# Patient Record
Sex: Male | Born: 1970 | Race: Black or African American | Hispanic: No | Marital: Married | Smoking: Current every day smoker
Health system: Southern US, Community
[De-identification: ages and names within clinical notes are randomized; demographics above are authoritative.]

## PROBLEM LIST (undated history)

## (undated) DIAGNOSIS — G4730 Sleep apnea, unspecified: Secondary | ICD-10-CM

## (undated) DIAGNOSIS — M1990 Unspecified osteoarthritis, unspecified site: Secondary | ICD-10-CM

## (undated) DIAGNOSIS — R519 Headache, unspecified: Secondary | ICD-10-CM

## (undated) DIAGNOSIS — K219 Gastro-esophageal reflux disease without esophagitis: Secondary | ICD-10-CM

## (undated) DIAGNOSIS — S83241A Other tear of medial meniscus, current injury, right knee, initial encounter: Secondary | ICD-10-CM

## (undated) DIAGNOSIS — L853 Xerosis cutis: Secondary | ICD-10-CM

## (undated) DIAGNOSIS — F528 Other sexual dysfunction not due to a substance or known physiological condition: Secondary | ICD-10-CM

## (undated) DIAGNOSIS — IMO0002 Reserved for concepts with insufficient information to code with codable children: Secondary | ICD-10-CM

## (undated) DIAGNOSIS — J189 Pneumonia, unspecified organism: Secondary | ICD-10-CM

## (undated) DIAGNOSIS — E119 Type 2 diabetes mellitus without complications: Secondary | ICD-10-CM

## (undated) DIAGNOSIS — Z87442 Personal history of urinary calculi: Secondary | ICD-10-CM

## (undated) DIAGNOSIS — N200 Calculus of kidney: Secondary | ICD-10-CM

## (undated) DIAGNOSIS — I10 Essential (primary) hypertension: Secondary | ICD-10-CM

## (undated) DIAGNOSIS — F419 Anxiety disorder, unspecified: Secondary | ICD-10-CM

## (undated) DIAGNOSIS — R51 Headache: Secondary | ICD-10-CM

## (undated) HISTORY — DX: Reserved for concepts with insufficient information to code with codable children: IMO0002

## (undated) HISTORY — DX: Other sexual dysfunction not due to a substance or known physiological condition: F52.8

## (undated) HISTORY — DX: Sleep apnea, unspecified: G47.30

## (undated) HISTORY — PX: OTHER SURGICAL HISTORY: SHX169

## (undated) HISTORY — DX: Morbid (severe) obesity due to excess calories: E66.01

## (undated) HISTORY — DX: Essential (primary) hypertension: I10

---

## 2005-02-26 ENCOUNTER — Emergency Department (HOSPITAL_COMMUNITY): Admission: EM | Admit: 2005-02-26 | Discharge: 2005-02-26 | Payer: Self-pay | Admitting: Emergency Medicine

## 2005-05-11 ENCOUNTER — Ambulatory Visit: Payer: Self-pay | Admitting: Internal Medicine

## 2005-05-28 ENCOUNTER — Ambulatory Visit: Payer: Self-pay | Admitting: Internal Medicine

## 2006-08-05 ENCOUNTER — Ambulatory Visit: Payer: Self-pay | Admitting: Internal Medicine

## 2006-09-22 ENCOUNTER — Emergency Department (HOSPITAL_COMMUNITY): Admission: EM | Admit: 2006-09-22 | Discharge: 2006-09-22 | Payer: Self-pay | Admitting: Emergency Medicine

## 2006-10-03 ENCOUNTER — Telehealth: Payer: Self-pay | Admitting: Internal Medicine

## 2006-10-31 ENCOUNTER — Telehealth: Payer: Self-pay | Admitting: Internal Medicine

## 2007-02-28 ENCOUNTER — Telehealth: Payer: Self-pay | Admitting: Internal Medicine

## 2007-03-03 ENCOUNTER — Ambulatory Visit: Payer: Self-pay | Admitting: Internal Medicine

## 2007-03-03 DIAGNOSIS — IMO0002 Reserved for concepts with insufficient information to code with codable children: Secondary | ICD-10-CM | POA: Insufficient documentation

## 2007-03-03 DIAGNOSIS — F528 Other sexual dysfunction not due to a substance or known physiological condition: Secondary | ICD-10-CM

## 2007-03-03 DIAGNOSIS — H60399 Other infective otitis externa, unspecified ear: Secondary | ICD-10-CM | POA: Insufficient documentation

## 2007-03-03 DIAGNOSIS — K649 Unspecified hemorrhoids: Secondary | ICD-10-CM

## 2007-03-03 HISTORY — DX: Reserved for concepts with insufficient information to code with codable children: IMO0002

## 2007-03-03 HISTORY — DX: Other sexual dysfunction not due to a substance or known physiological condition: F52.8

## 2007-03-03 LAB — CONVERTED CEMR LAB: Testosterone: 246.66 ng/dL — ABNORMAL LOW

## 2007-03-04 ENCOUNTER — Telehealth: Payer: Self-pay | Admitting: Internal Medicine

## 2007-07-03 ENCOUNTER — Ambulatory Visit: Payer: Self-pay | Admitting: Internal Medicine

## 2007-07-03 DIAGNOSIS — R51 Headache: Secondary | ICD-10-CM

## 2007-07-03 DIAGNOSIS — I10 Essential (primary) hypertension: Secondary | ICD-10-CM

## 2007-07-03 DIAGNOSIS — R519 Headache, unspecified: Secondary | ICD-10-CM | POA: Insufficient documentation

## 2007-07-03 DIAGNOSIS — K602 Anal fissure, unspecified: Secondary | ICD-10-CM | POA: Insufficient documentation

## 2007-07-03 HISTORY — DX: Essential (primary) hypertension: I10

## 2007-07-08 ENCOUNTER — Telehealth: Payer: Self-pay | Admitting: Internal Medicine

## 2007-08-05 ENCOUNTER — Telehealth: Payer: Self-pay | Admitting: Internal Medicine

## 2007-09-16 ENCOUNTER — Telehealth: Payer: Self-pay | Admitting: Internal Medicine

## 2007-10-23 ENCOUNTER — Telehealth: Payer: Self-pay | Admitting: Internal Medicine

## 2007-10-24 ENCOUNTER — Ambulatory Visit: Payer: Self-pay | Admitting: Internal Medicine

## 2007-10-24 LAB — CONVERTED CEMR LAB
ALT: 32 units/L (ref 0–53)
Albumin: 4.1 g/dL (ref 3.5–5.2)
BUN: 14 mg/dL (ref 6–23)
Blood Glucose, Fingerstick: 83
Chloride: 103 meq/L (ref 96–112)
Creatinine, Ser: 1.1 mg/dL (ref 0.4–1.5)
Glucose, Bld: 77 mg/dL (ref 70–99)
Lymphocytes Relative: 43 % (ref 12.0–46.0)
MCHC: 33.7 g/dL (ref 30.0–36.0)
MCV: 93.7 fL (ref 78.0–100.0)
Monocytes Absolute: 0.5 10*3/uL (ref 0.1–1.0)
Monocytes Relative: 9.7 % (ref 3.0–12.0)
Neutrophils Relative %: 42.7 % — ABNORMAL LOW (ref 43.0–77.0)
Potassium: 4.1 meq/L (ref 3.5–5.1)
Sodium: 140 meq/L (ref 135–145)
TSH: 2.03 microintl units/mL (ref 0.35–5.50)
Testosterone: 157.87 ng/dL — ABNORMAL LOW (ref 350.00–890)
Total Bilirubin: 0.7 mg/dL (ref 0.3–1.2)
Total Protein: 7.4 g/dL (ref 6.0–8.3)
WBC: 5.4 10*3/uL (ref 4.5–10.5)

## 2007-10-27 ENCOUNTER — Telehealth: Payer: Self-pay | Admitting: Internal Medicine

## 2007-11-11 ENCOUNTER — Encounter: Payer: Self-pay | Admitting: Internal Medicine

## 2007-12-03 ENCOUNTER — Telehealth: Payer: Self-pay | Admitting: Internal Medicine

## 2007-12-21 ENCOUNTER — Emergency Department (HOSPITAL_COMMUNITY): Admission: EM | Admit: 2007-12-21 | Discharge: 2007-12-21 | Payer: Self-pay | Admitting: Emergency Medicine

## 2007-12-30 ENCOUNTER — Ambulatory Visit: Payer: Self-pay | Admitting: Internal Medicine

## 2008-01-01 ENCOUNTER — Telehealth: Payer: Self-pay | Admitting: Internal Medicine

## 2008-01-01 LAB — CONVERTED CEMR LAB: Testosterone: 204.35 ng/dL — ABNORMAL LOW (ref 350.00–890)

## 2008-01-06 ENCOUNTER — Telehealth: Payer: Self-pay | Admitting: Internal Medicine

## 2008-01-07 ENCOUNTER — Telehealth: Payer: Self-pay | Admitting: Internal Medicine

## 2008-02-02 ENCOUNTER — Telehealth: Payer: Self-pay | Admitting: Internal Medicine

## 2008-08-13 ENCOUNTER — Ambulatory Visit: Payer: Self-pay | Admitting: Internal Medicine

## 2008-08-13 LAB — CONVERTED CEMR LAB
ALT: 34 units/L (ref 0–53)
Albumin: 3.9 g/dL (ref 3.5–5.2)
BUN: 17 mg/dL (ref 6–23)
Basophils Absolute: 0 10*3/uL (ref 0.0–0.1)
Basophils Relative: 0.4 % (ref 0.0–3.0)
Bilirubin Urine: NEGATIVE
Bilirubin, Direct: 0 mg/dL (ref 0.0–0.3)
Blood in Urine, dipstick: NEGATIVE
Cholesterol: 212 mg/dL — ABNORMAL HIGH (ref 0–200)
Direct LDL: 129.7 mg/dL
Eosinophils Relative: 6.3 % — ABNORMAL HIGH (ref 0.0–5.0)
Glucose, Bld: 94 mg/dL (ref 70–99)
Glucose, Urine, Semiquant: NEGATIVE
HCT: 49 % (ref 39.0–52.0)
Ketones, urine, test strip: NEGATIVE
Lymphocytes Relative: 34.9 % (ref 12.0–46.0)
MCHC: 34.5 g/dL (ref 30.0–36.0)
MCV: 91.9 fL (ref 78.0–100.0)
Monocytes Absolute: 0.5 10*3/uL (ref 0.1–1.0)
Neutrophils Relative %: 49.2 % (ref 43.0–77.0)
Nitrite: NEGATIVE
Potassium: 4 meq/L (ref 3.5–5.1)
Protein, U semiquant: NEGATIVE
RDW: 13.1 % (ref 11.5–14.6)
Sodium: 142 meq/L (ref 135–145)
Total Bilirubin: 1 mg/dL (ref 0.3–1.2)
Total Protein: 7.5 g/dL (ref 6.0–8.3)
WBC Urine, dipstick: NEGATIVE
WBC: 5.7 10*3/uL (ref 4.5–10.5)

## 2008-08-24 ENCOUNTER — Ambulatory Visit: Payer: Self-pay | Admitting: Internal Medicine

## 2009-08-23 ENCOUNTER — Ambulatory Visit: Payer: Self-pay | Admitting: Internal Medicine

## 2009-08-23 LAB — CONVERTED CEMR LAB
Albumin: 3.8 g/dL (ref 3.5–5.2)
Alkaline Phosphatase: 38 units/L — ABNORMAL LOW (ref 39–117)
BUN: 19 mg/dL (ref 6–23)
Basophils Absolute: 0 10*3/uL (ref 0.0–0.1)
Bilirubin, Direct: 0.1 mg/dL (ref 0.0–0.3)
CO2: 30 meq/L (ref 19–32)
Calcium: 9.2 mg/dL (ref 8.4–10.5)
Chloride: 104 meq/L (ref 96–112)
Creatinine, Ser: 1.2 mg/dL (ref 0.4–1.5)
Eosinophils Relative: 4.7 % (ref 0.0–5.0)
Glucose, Bld: 90 mg/dL (ref 70–99)
Lymphocytes Relative: 37.5 % (ref 12.0–46.0)
MCHC: 34.5 g/dL (ref 30.0–36.0)
MCV: 93 fL (ref 78.0–100.0)
Monocytes Relative: 8.8 % (ref 3.0–12.0)
Neutrophils Relative %: 48.6 % (ref 43.0–77.0)
Platelets: 200 10*3/uL (ref 150.0–400.0)
Potassium: 4.1 meq/L (ref 3.5–5.1)
RBC: 4.99 M/uL (ref 4.22–5.81)
RDW: 14.4 % (ref 11.5–14.6)
Specific Gravity, Urine: 1.025
Total Bilirubin: 0.6 mg/dL (ref 0.3–1.2)
Total CHOL/HDL Ratio: 3
Total Protein: 6.9 g/dL (ref 6.0–8.3)
Triglycerides: 89 mg/dL (ref 0.0–149.0)
WBC Urine, dipstick: NEGATIVE

## 2009-08-30 ENCOUNTER — Ambulatory Visit: Payer: Self-pay | Admitting: Internal Medicine

## 2010-03-14 NOTE — Assessment & Plan Note (Signed)
Summary: cpx/njr   Vital Signs:  Patient profile:   40 year old male Height:      72.75 inches Temp:     98.1 degrees F oral BP sitting:   132 / 80  (left arm) Cuff size:   large  Vitals Entered By: Duard Brady LPN (August 30, 2009 2:41 PM) CC: wt >350 , cpx - doing well , c/o increased sweating Is Patient Diabetic? No   CC:  wt >350 , cpx - doing well , and c/o increased sweating.  History of Present Illness: 40 year old patient has a history of hypertension, testosterone deficiency and EDD is seen today for an annual physical.  He has a history of exogenous obesity.  He has been off all his medications.  Preventive Screening-Counseling & Management  Alcohol-Tobacco     Smoking Cessation Counseling: yes  Allergies (verified): No Known Drug Allergies  Past History:  Past Medical History: Hypertension testosterone deficiency with ED Headache history tobacco use anal fissure morbid obesity  Past Surgical History: none  Family History: Reviewed history from 08/24/2008 and no changes required. father age 11 in good health mother is 95, hypertension, DJD  One brother, positive for arthritis, and hepatitis C, died of sepsis 4 sisters positive for diabetes; s/p PM  Social History: Reviewed history from 07/03/2007 and no changes required. Divorced two children [employer]  Review of Systems       The patient complains of weight gain.  The patient denies anorexia, fever, weight loss, vision loss, decreased hearing, hoarseness, chest pain, syncope, dyspnea on exertion, peripheral edema, prolonged cough, headaches, hemoptysis, abdominal pain, melena, hematochezia, severe indigestion/heartburn, hematuria, incontinence, genital sores, muscle weakness, suspicious skin lesions, transient blindness, difficulty walking, depression, unusual weight change, abnormal bleeding, enlarged lymph nodes, angioedema, breast masses, and testicular masses.    Physical  Exam  General:  overweight-appearing.  150/100overweight-appearing.   Head:  Normocephalic and atraumatic without obvious abnormalities. No apparent alopecia or balding. Eyes:  No corneal or conjunctival inflammation noted. EOMI. Perrla. Funduscopic exam benign, without hemorrhages, exudates or papilledema. Vision grossly normal. Ears:  External ear exam shows no significant lesions or deformities.  Otoscopic examination reveals clear canals, tympanic membranes are intact bilaterally without bulging, retraction, inflammation or discharge. Hearing is grossly normal bilaterally. Nose:  External nasal examination shows no deformity or inflammation. Nasal mucosa are pink and moist without lesions or exudates. Mouth:  Oral mucosa and oropharynx without lesions or exudates.  Teeth in good repair. Neck:  No deformities, masses, or tenderness noted. Chest Wall:  No deformities, masses, tenderness or gynecomastia noted. Breasts:  No masses or gynecomastia noted Lungs:  Normal respiratory effort, chest expands symmetrically. Lungs are clear to auscultation, no crackles or wheezes. Heart:  Normal rate and regular rhythm. S1 and S2 normal without gallop, murmur, click, rub or other extra sounds. Abdomen:  Bowel sounds positive,abdomen soft and non-tender without masses, organomegaly or hernias noted. Genitalia:  Testes bilaterally descended without nodularity, tenderness or masses. No scrotal masses or lesions. No penis lesions or urethral discharge. Msk:  No deformity or scoliosis noted of thoracic or lumbar spine.   Pulses:  R and L carotid,radial,femoral,dorsalis pedis and posterior tibial pulses are full and equal bilaterally Extremities:  No clubbing, cyanosis, edema, or deformity noted with normal full range of motion of all joints.   Neurologic:  No cranial nerve deficits noted. Station and gait are normal. Plantar reflexes are down-going bilaterally. DTRs are symmetrical throughout. Sensory, motor and  coordinative  functions appear intact. Skin:  Intact without suspicious lesions or rashes Cervical Nodes:  No lymphadenopathy noted Axillary Nodes:  No palpable lymphadenopathy Inguinal Nodes:  No significant adenopathy Psych:  Cognition and judgment appear intact. Alert and cooperative with normal attention span and concentration. No apparent delusions, illusions, hallucinations   Impression & Recommendations:  Problem # 1:  HYPERTENSION (ICD-401.9)  The following medications were removed from the medication list:    Benazepril Hcl 20 Mg Tabs (Benazepril hcl) ..... One daily His updated medication list for this problem includes:    Lisinopril-hydrochlorothiazide 20-25 Mg Tabs (Lisinopril-hydrochlorothiazide) ..... One tablet daily  The following medications were removed from the medication list:    Benazepril Hcl 20 Mg Tabs (Benazepril hcl) ..... One daily His updated medication list for this problem includes:    Lisinopril-hydrochlorothiazide 20-25 Mg Tabs (Lisinopril-hydrochlorothiazide) ..... One tablet daily  Problem # 2:  Preventive Health Care (ICD-V70.0)  Complete Medication List: 1)  Lisinopril-hydrochlorothiazide 20-25 Mg Tabs (Lisinopril-hydrochlorothiazide) .... One tablet daily 2)  Testim 1 % Gel (Testosterone) .... Use every morning 3)  Androgel 50 Mg/5gm Gel (Testosterone) .... Use every morning  Patient Instructions: 1)  Please schedule a follow-up appointment in 1 month. 2)  Limit your Sodium (Salt). 3)  Tobacco is very bad for your health and your loved ones! You Should stop smoking!. 4)  It is important that you exercise regularly at least 20 minutes 5 times a week. If you develop chest pain, have severe difficulty breathing, or feel very tired , stop exercising immediately and seek medical attention. 5)  You need to lose weight. Consider a lower calorie diet and regular exercise.  Prescriptions: ANDROGEL 50 MG/5GM GEL (TESTOSTERONE) use every morning  #0 x 6    Entered and Authorized by:   Gordy Savers  MD   Signed by:   Gordy Savers  MD on 08/30/2009   Method used:   Print then Give to Patient   RxID:   [id number] TESTIM 1 % GEL (TESTOSTERONE) use every morning  #90 x 6   Entered and Authorized by:   Gordy Savers  MD   Signed by:   Gordy Savers  MD on 08/30/2009   Method used:   Print then Give to Patient   RxID:   [id number] LISINOPRIL-HYDROCHLOROTHIAZIDE 20-25 MG TABS (LISINOPRIL-HYDROCHLOROTHIAZIDE) one tablet daily  #90 x 4   Entered and Authorized by:   Gordy Savers  MD   Signed by:   Gordy Savers  MD on 08/30/2009   Method used:   Print then Give to Patient   RxID:   [id number]

## 2010-03-14 NOTE — Progress Notes (Signed)
Summary: refill  Phone Note Call from Patient Call back at [phone]   Caller: pt vm triage Call For: k Summary of Call: 7.5 mg Hydrocodone [facility]  Initial call taken by: Roselle Locus,  October 23, 2007 1:44 PM  Follow-up for Phone Call        is this okay to call in? Follow-up by: Kern Reap CMA,  October 23, 2007 1:49 PM  Additional Follow-up for Phone Call Additional follow up Details #1::        #50      Appended Document: refill rx called in

## 2010-04-13 ENCOUNTER — Encounter: Payer: Self-pay | Admitting: Internal Medicine

## 2010-04-14 ENCOUNTER — Encounter: Payer: Self-pay | Admitting: Internal Medicine

## 2010-04-14 ENCOUNTER — Ambulatory Visit (INDEPENDENT_AMBULATORY_CARE_PROVIDER_SITE_OTHER): Payer: PRIVATE HEALTH INSURANCE | Admitting: Internal Medicine

## 2010-04-14 DIAGNOSIS — F528 Other sexual dysfunction not due to a substance or known physiological condition: Secondary | ICD-10-CM

## 2010-04-14 DIAGNOSIS — IMO0002 Reserved for concepts with insufficient information to code with codable children: Secondary | ICD-10-CM

## 2010-04-14 MED ORDER — TESTOSTERONE 50 MG/5GM (1%) TD GEL: 5.0000 g | Freq: Every day | TRANSDERMAL | Status: DC

## 2010-04-14 MED ORDER — LORAZEPAM 0.5 MG PO TABS: 0.5000 mg | ORAL_TABLET | Freq: Three times a day (TID) | ORAL | Status: AC | PRN

## 2010-04-14 MED ORDER — LISINOPRIL-HYDROCHLOROTHIAZIDE 20-25 MG PO TABS: 1.0000 | ORAL_TABLET | Freq: Every day | ORAL | Status: DC

## 2010-04-14 NOTE — Patient Instructions (Signed)
Limit your sodium (Salt) intake  Please check your blood pressure on a regular basis.  If it is consistently greater than 150/90, please make an office appointment.    It is important that you exercise regularly, at least 20 minutes 3 to 4 times per week.  If you develop chest pain or shortness of breath seek  medical attention.    Hypertension (High Blood Pressure) Most people with high blood pressure (hypertension) have no symptoms. High blood pressure can be a dangerous problem. Hypertension is dangerous because you may have it and not know it. High blood pressure may mean that your heart needs to work harder to pump blood.  Your blood pressure is measured with 2 numbers. The first number is when your heart flexes (contracts), and the second number is when your heart relaxes. The higher the numbers are, the more you are at risk for problems. Write down your blood pressure today. The best blood pressure for adults is 120/80 (mmHg) or lower. It is likely that your blood pressure was recorded at least 2 times today. It is important for you to give these numbers to your doctor. If you do not have a doctor, try to get follow-up care at a hospital or community clinic. You may need to start high blood pressure medicine. You may also need to adjust your medicines as told by your doctor. Even mild high blood pressure increases long-term health risks. One high reading does not mean you have hypertension. MARGIN-BOTTOM: 0mm  Your blood pressure should be taken when you are relaxed. It is also important to sit for about 10 minutes before being tested.  Things that can increase your blood pressure are:   Injury.   Illness.    Stress.     Caffeine.   Some medicines (like decongestants).       High blood pressure does not usually need emergency treatment.  HOME CARE MARGIN-BOTTOM: 0mm  Lifestyle and medicine changes may be needed, including:  MARGIN-BOTTOM: 0mm  Weight loss.  Exercise.    MARGIN-BOTTOM: 0mm  Limit the use of salt.  Stop smoking.  If using decongestants or birth control pills, talk to your doctor. These medicines might make blood pressure higher.  Do not use street drugs.  Females should not drink more than 1 alcoholic drink per day. Males should not drink more than 2 alcoholic drinks per day.  Take your blood pressure medicine. You will need to take it every day. If you do not get treated, there are risks, including:  MARGIN-BOTTOM: 0mm  Heart disease.  Stroke.  Kidney failure.  MARGIN-BOTTOM: 0mm  See your doctor as told.  GET HELP RIGHT AWAY IF: MARGIN-BOTTOM: 0mm  You get a very bad headache.  You get blurred or changing vision.  You feel confused.  You feel weak, numb, or faint.  You get chest or belly (abdominal) pain.  You throw up (vomit).  You cannot breathe very well.  If you have a blood pressure reading with a top number of 180 or higher, you need to see your doctor right away. This is especially true if you are having any of the problems listed above. MAKE SURE YOU: MARGIN-BOTTOM: 0mm  Understand these instructions.  Will watch your condition.  Will get help right away if you are not doing well or get worse.  Document Released: 07/18/2007 Document Re-Released: 07/19/2009 [facility] Patient Information 2011 [place], [place].Obesity Obesity is defined as having a Body Mass Index (BMI) of 30 or more.  To calculate your BMI divide your weight in pounds by your height in inches squared and multiply that product by 703. Major illnesses resulting from long-term obesity include:  Stroke.   Heart disease.   Diabetes.   Many cancers.   Arthritis.  Obesity also complicates recovery from many other medical problems.   CAUSES  A history of obesity in your parents.   Thyroid hormone imbalance.   Environmental factors such as excess calorie intake and physical inactivity.  TREATMENT A healthy weight loss program includes:  A calorie  restricted diet based on individual calorie needs.   Increased physical activity (exercise).  An exercise program is just as important as the right low calorie diet.   Weight-loss medicines should be used only under the supervision of your physician. These medicines help, but only if they are used with diet and exercise programs. Medicines can have side effects including nervousness, nausea, abdominal pain, diarrhea, headache, drowsiness, and depression.   An unhealthy weight loss program includes:  Fasting.   Fad diets.   Supplements and drugs.  These choices do not succeed in long-term weight control.   HOME CARE INSTRUCTIONS To help you make the needed dietary changes:    Keep a daily record of everything you eat. There are many free websites to help you with this. It may be helpful to measure your foods so you can determine if you are eating the correct portion sizes.   Use low-calorie cookbooks or take special cooking classes.   Avoid alcohol. Drink more water and drinks with no calories.   Take vitamins and supplements only as recommended by your caregiver.   Weight-loss support groups, Optometrist, counselors, and stress reduction education can also be very helpful.  PREVENTION Losing weight and keeping it off takes time, discipline, a healthy diet and regular exercise. Document Released: 03/08/2004 Document Re-Released: 02/18/2007 [employer] Patient Information 2011 [employer].1500 Calorie Diabetic Diet The 1500 calorie diabetic diet limits calories to 1500 each day. Following this diet and making healthy meal choices can help improve overall health. It controls blood sugar levels and can also help lower blood pressure and cholesterol.   SERVING SIZES: Measuring foods and serving sizes helps to make sure you are getting the right amount of food. The list below tells how big or small some common serving sizes are.    1 ounce (oz) . . . . . . . . . . . . . . . [name] 4  stacked dice   3 oz . . . . . . . . . . . . . . . . . . . . . [name] Deck of cards     1 teaspoon (tsp) . . . . . . . . . . . . . Tip of little finger     1 tablespoon (Tbsp) . . . . . . . . . . Thumb     2 Tbsp . . . . . . . . . . . . . . . . . . . . . Golf ball     1/2 cup . . . . . . . . . . . . . . . . . . . . Half of a fist     1 cup . . . . . . . . . . . . . . . . . . . . . A fist     GUIDELINES FOR CHOOSING FOODS: The goal of this diet  is to eat a variety of foods and limit calories to 1500 each day. This can be done by choosing foods that are low in calories and fat. The diet also suggests eating small amounts of food frequently. Doing this helps control your blood sugar levels, so they do not get too high or too low. Each meal or snack may include a protein food source to help you feel more satisfied. Try to eat about the same amount of food around the same time each day. This includes weekend days, travel days, and days off work. Space your meals about 4-5 hours apart, and add a snack between them, if you wish.   For example, a daily food plan could include breakfast, a morning snack, lunch, dinner, and an evening snack. Healthy meals and snacks have different types of foods, including whole grains, vegetables, fruits, lean meats, poultry, fish, and dairy products. As you plan your meals, select a variety of foods. Choose from the bread and starch, vegetable, fruit, dairy, and meat/protein groups. Examples of foods from each group are listed below, with their suggested serving sizes. Use measuring cups and spoons to become familiar with what a healthy portion looks like. Bread and Starch   Each serving equals 15 grams of carbohydrate 1 slice bread   1/4 bagel   3/4 cup cold cereal (unsweetened)   1/2 cup hot cereal or mashed potatoes   1 small potato (size of a computer mouse) 1/3 cup cooked pasta or rice 1/2 English muffin 1 cup broth based soup 3 cups of popcorn 4-6 whole wheat  crackers 1/2 cup cooked beans, peas, or corn Vegetables Each serving equals 5 grams of carbohydrate 1/2 cup cooked vegetables 1 cup raw vegetables 1/2 cup tomato or vegetable juice Fruit   Each serving equals 15 grams of carbohydrate   1 small apple or orange   1-1/4 cup watermelon or strawberries   1/2 cup applesauce (no sugar added)   2 Tbsp raisins   1/2 banana 1/2 cup canned fruit, packed in water or in its own juice 1/2 cup unsweetened fruit juice   Dairy Each serving equals 12-15 grams of carbohydrate   1 cup fat free milk 6 oz artificially sweetened yogurt or plain yogurt 1 cup low fat buttermilk 1 cup soy milk 1 cup almond milk Meat/Protein 1 large egg 2-3 oz meat, poultry, or fish 1/4 cup low fat cottage cheese 1 Tbsp peanut butter 1 oz low fat cheese 1/4 cup tuna, packed in water 1/2 cup tofu Fat 1 tsp oil 1 tsp trans fat free margarine 1 tsp butter 1 tsp mayonnaise 2 Tbsp avocado 1 Tbsp salad dressing 1 Tbsp cream cheese 2 Tbsp sour cream SAMPLE 1500 CALORIE DIET PLAN: Breakfast:  1/2 whole wheat English muffin (1 carb choice)   1 tsp trans fat free margarine   1 scrambled egg   1 cup fat free milk (1 carb choice)   1 small orange (1 carb choice)  Lunch:  Chicken wrap   1 whole wheat tortilla, 8-inch (1-1/2 carb choices)   2 oz chicken breast, sliced   2 Tbsp low fat salad dressing, such as [place]   1/4 cup shredded lettuce   2 slices tomato   1/2 cup carrot sticks   1 small apple (1 carb choice)  Afternoon Snack:  3 graham cracker squares (1 carb choice)   1 Tbsp peanut butter  Dinner:  2 oz lean pork chop, broiled   1 cup brown rice (3  carb choices)   1/2 cup steamed carrots   1/2 cup green beans   1 cup fat free milk (1 carb choice)   1 tsp trans fat free margarine  Evening Snack:  1/2 cup low fat cottage cheese   1 small peach or pear, sliced (or 1/2 cup canned in water) (1 carb choice)  Meal Plan You can use  this worksheet to help you make a daily meal plan based on the 1500 calorie diabetic diet suggestions. If you are using this plan to help you control your blood glucose, you may interchange carbohydrate containing foods (dairy, starches, and fruits). Select a variety of fresh foods of varying colors and flavors. The total amount of carbohydrate in your meals or snacks is more important than making sure you include all of the food groups every time you eat. You can choose from approximately this many of the following foods to build your day's meals:  6 Starches.   3 Vegetables.     2 Fruits.   2 Dairy.     4-6 oz Meat/Protein.   Up to 3 Fats.     Your dietician can use this worksheet to help you decide how many servings and which types of foods are right for you. Breakfast Food Group and Servings Food Choice Starch _________________________________________________________ Dairy __________________________________________________________ Fruit ___________________________________________________________ Meat/Protein_____________________________________________________ Fat ____________________________________________________________ Lunch Food Group and Servings Food Choice  Starch _________________________________________________________ Meat/Protein ____________________________________________________ Vegetables ______________________________________________________ Fruit ___________________________________________________________ Dairy __________________________________________________________ Fat ____________________________________________________________ Afternoon Snack Food Group and Servings Food Choice Dairy __________________________________________________________ Starch __________________________________________________________ Meat/Protein_____________________________________________________ Fruit ___________________________________________________________ Dinner Food Group  and Servings Food Choice Starch _________________________________________________________ Meat/Protein ____________________________________________________ Dairy __________________________________________________________ Vegetable ______________________________________________________ Fruit ___________________________________________________________ Fat ____________________________________________________________ Evening Snack Food Group and Servings Food Choice Fruit ___________________________________________________________ Meat/Protein ____________________________________________________ Dairy __________________________________________________________ Starch __________________________________________________________ Daily Totals Starches _________________________  Vegetables _________________________ Fruits _____________________________ Dairy _____________________________ Meat/Protein________________________ Fats _______________________________   Document Released: 08/21/2004 Document Re-Released: 07/19/2009 [employer] Patient Information 2011 [employer].2 Gram Low Sodium Diet A 2 gram sodium diet restricts the amount of sodium in the diet to no more than 2 grams (g) or 2000 milligrams (mg) daily. Limiting the amount of sodium is often used to help lower blood pressure. It is important if you have heart, liver, or kidney problems. Many foods contain sodium for flavor and sometimes as a preservative. When the amount of sodium in a diet needs to be low, it is important to know what to look for when choosing foods and drinks. The following includes some information and guidelines to help make it easier for you to adapt to a low sodium diet. QUICK TIPS  Do not add salt to food.   Avoid convenience items and fast food.   Choose unsalted snack foods.   Buy lower sodium products, often labeled as "lower sodium" or "no salt added."   Check food labels to learn how much sodium is in 1  serving.   When eating at a restaurant, ask that your food be prepared with less salt or none, if possible.  READING FOOD LABELS FOR SODIUM INFORMATION The nutrition facts label is a good place to find how much sodium is in foods. Look for products with no more than 500 to 600 mg of sodium per meal and no more than 150 mg per serving. Remember that 2 g = 2000 mg. The food label may also list foods as:  Sodium-free: Less than 5 mg in a serving.   Very low sodium: 35 mg or less in a serving.  Low-sodium: 140 mg or less in a serving.   Light in sodium: 50% less sodium in a serving. For example, if a food that usually has 300 mg of sodium is changed to become light in sodium, it will have 150 mg of sodium.   Reduced sodium: 25% less sodium in a serving. For example, if a food that usually has 400 mg of sodium is changed to reduced sodium, it will have 300 mg of sodium.  CHOOSING FOODS AVOID  CHOOSE   Grains:  Salted crackers and snack items. Some cereals, including instant hot cereals. Bread stuffing and biscuit mixes. Seasoned rice or pasta mixes.  Grains:  Unsalted snack items. Low-sodium cereals, oats, puffed wheat and rice, shredded wheat. English muffins and bread. Pasta.   Meats:  Salted, canned, smoked, spiced, or pickled meats, including fish and poultry. Bacon, ham, sausage, cold cuts, hot dogs, anchovies.  Meats:  Low-sodium canned tuna and salmon. Fresh or frozen meat, poultry, and fish.   Dairy:  Processed cheese and spreads. Cottage cheese. Buttermilk and condensed milk. Regular cheese.  Dairy:  Milk. Low-sodium cottage cheese. Yogurt. Sour cream. Low-sodium cheese.   Fruits and Vegetables:  Regular canned vegetables. Regular canned tomato sauce and paste. Frozen vegetables in sauces. Olives. Rosita Fire. Relishes. Sauerkraut.  Fruits and Vegetables:  Low-sodium canned vegetables. Low-sodium tomato sauce and paste. Fresh and frozen vegetables. Fresh and  frozen fruit.   Condiments:  Canned and packaged gravies. Worcestershire sauce. Tartar sauce. Barbecue sauce. Soy sauce. Steak sauce. Ketchup. Onion, garlic, and table salt. Meat flavorings and tenderizers.  Condiments:  Fresh and dried herbs and spices. Low-sodium varieties of mustard and ketchup. Lemon juice. Tabasco sauce. Horseradish.   SAMPLE 2 GRAM SODIUM MEAL PLAN Meal  Foods  Sodium (mg)   Breakfast  1 cup low-fat milk  2 slices whole-wheat toast 1 tablespoon heart-healthy margarine 1 hard-boiled egg 1 small orange  [phone]   Lunch  1 cup raw carrots   cup hummus 1 cup low-fat milk  cup red grapes 1 whole-wheat pita bread  [phone]   Dinner  1 cup whole-wheat pasta  1 cup low-sodium tomato sauce 3 ounces lean ground beef 1 small side salad (1 cup raw spinach leaves,  cup cucumber,  cup yellow bell pepper) with 1 teaspoon olive oil and 1 teaspoon red wine vinegar  [phone]   Snack  1 container low-fat vanilla yogurt  3 graham cracker squares  [phone]   Nutrient Analysis Calories: 2033 Protein: 77 g Carbohydrate: 282 g Fat: 72 g Sodium: 1971 mg Document Released: 01/29/2005 Document Re-Released: 07/19/2009 [facility] Patient Information 2011 [place], [place].

## 2010-04-14 NOTE — Progress Notes (Signed)
  Subjective:    Patient ID: Justin Norman, male    DOB: [birth year], 40 y.o.   MRN: [id number]  HPI   40 year old patient who has a history of hypertension. He has been noncompliant and out of his medication. He describes weight gain situational stress and fatigue. He is a OSA  Suspect  And has agreed to a have a sleep study performed later this spring. He has been under much situational stress 22 children job and also the illness of his fiance. He describes intermittent left facial numbness and swelling denies any other focal neurological symptoms he has testosterone deficiency but has not been taking AndroGel regularly. He feels there is some fluid retention in his legs.    Review of Systems  Constitutional: Positive for fatigue and unexpected weight change. Negative for fever, chills and appetite change.  HENT: Negative for hearing loss, ear pain, congestion, sore throat, trouble swallowing, neck stiffness, dental problem, voice change and tinnitus.   Eyes: Negative for pain, discharge and visual disturbance.  Respiratory: Negative for cough, chest tightness, wheezing and stridor.   Cardiovascular: Positive for leg swelling. Negative for chest pain and palpitations.  Gastrointestinal: Negative for nausea, vomiting, abdominal pain, diarrhea, constipation, blood in stool and abdominal distention.  Genitourinary: Negative for urgency, hematuria, flank pain, discharge, difficulty urinating and genital sores.  Musculoskeletal: Negative for myalgias, back pain, joint swelling, arthralgias and gait problem.  Skin: Negative for rash.  Neurological: Negative for dizziness, syncope, speech difficulty, weakness, numbness and headaches.  Hematological: Negative for adenopathy. Does not bruise/bleed easily.  Psychiatric/Behavioral: Negative for behavioral problems and dysphoric mood. The patient is not nervous/anxious.        Objective:   Physical Exam  Constitutional: He is oriented to person,  place, and time. He appears well-developed and well-nourished.        Weight 265  HENT:  Head: Normocephalic.  Right Ear: External ear normal.  Left Ear: External ear normal.  Eyes: Conjunctivae and EOM are normal.  Neck: Normal range of motion.  Cardiovascular: Normal rate and normal heart sounds.   Pulmonary/Chest: Breath sounds normal.  Abdominal: Bowel sounds are normal.  Musculoskeletal: Normal range of motion. He exhibits tenderness. He exhibits no edema.        Trace lower extremity edema  Neurological: He is alert and oriented to person, place, and time. No cranial nerve deficit.  Psychiatric: He has a normal mood and affect. His behavior is normal.          Assessment & Plan:   hypertension- poor control compliance issues stressed. A new prescription dispensed. Given information concerning restricted salt diet low-calorie diet. He is aware that he may have obstructive sleep apnea which might be aggravating blood pressure control. May also be a major factor with fatigue  Situational stress  Testosterone deficiency   All medications refilled  Lifestyle issues discussed at length

## 2010-04-25 ENCOUNTER — Encounter: Payer: Self-pay | Admitting: Internal Medicine

## 2010-04-25 ENCOUNTER — Ambulatory Visit (INDEPENDENT_AMBULATORY_CARE_PROVIDER_SITE_OTHER): Payer: PRIVATE HEALTH INSURANCE | Admitting: Internal Medicine

## 2010-04-25 VITALS — BP 140/80 | Temp 98.2°F | Wt 365.0 lb

## 2010-04-25 DIAGNOSIS — M549 Dorsalgia, unspecified: Secondary | ICD-10-CM

## 2010-04-25 DIAGNOSIS — I10 Essential (primary) hypertension: Secondary | ICD-10-CM

## 2010-04-25 MED ORDER — METHYLPREDNISOLONE ACETATE 80 MG/ML IJ SUSP
80.0000 mg | Freq: Once | INTRAMUSCULAR | Status: AC
  Administered 2010-04-25: 80 mg via INTRAMUSCULAR

## 2010-04-25 MED ORDER — CYCLOBENZAPRINE HCL 10 MG PO TABS: 10.0000 mg | ORAL_TABLET | Freq: Three times a day (TID) | ORAL | Status: AC | PRN

## 2010-04-25 MED ORDER — TRAMADOL HCL 50 MG PO TABS: 50.0000 mg | ORAL_TABLET | Freq: Four times a day (QID) | ORAL | Status: AC | PRN

## 2010-04-25 NOTE — Progress Notes (Signed)
  Subjective:    Patient ID: Justin Norman, male    DOB: [birth year], 40 y.o.   MRN: [id number]  HPI   40 year old patient has a history of exogenous obesity and treated hypertension. For the past 6 months he has had intermittent low back pain. 3 days ago he lifted a tire into a truck and had the acute lumbar discomfort. Pain is aggravated by walking and bending and stooping but alleviated by rest and he has no pain with sitting. At times he feels there might be some radiation of pain to the left lateral leg but no striking radicular symptoms. No motor weakness. He has been using Aleve and ibuprofen with some benefit.    Review of Systems  Constitutional: Negative for fever, chills, appetite change and fatigue.  HENT: Negative for hearing loss, ear pain, congestion, sore throat, trouble swallowing, neck stiffness, dental problem, voice change and tinnitus.   Eyes: Negative for pain, discharge and visual disturbance.  Respiratory: Negative for cough, chest tightness, wheezing and stridor.   Cardiovascular: Negative for chest pain, palpitations and leg swelling.  Gastrointestinal: Negative for nausea, vomiting, abdominal pain, diarrhea, constipation, blood in stool and abdominal distention.  Genitourinary: Negative for urgency, hematuria, flank pain, discharge, difficulty urinating and genital sores.  Musculoskeletal: Positive for back pain. Negative for myalgias, joint swelling, arthralgias and gait problem.  Skin: Negative for rash.  Neurological: Negative for dizziness, syncope, speech difficulty, weakness, numbness and headaches.  Hematological: Negative for adenopathy. Does not bruise/bleed easily.  Psychiatric/Behavioral: Negative for behavioral problems and dysphoric mood. The patient is not nervous/anxious.        Objective:   Physical Exam  Constitutional: He appears well-developed and well-nourished. No distress.        obese  Musculoskeletal: Normal range of motion. He exhibits  no edema and no tenderness.        Straight leg testing was negative. No lumbar tenderness or spasm noted  Reflexes were intact  Motor strength was normal          Assessment & Plan:   #1. Back pain suspect musculoligamentous  . We'll treat with rest anti-inflammatories. The patient was given Depo-Medrol 80 mg IM he was given prescriptions for tramadol and Flexeril. He will continue Aleve 2 tablets twice a day. If he fails to improve or worsens he has been asked to contact the office

## 2010-04-25 NOTE — Patient Instructions (Signed)
Limit your sodium (Salt) intake You need to lose weight.  Consider a lower calorie diet and regular exercise.  Most patients with low back pain will improve with time over the next two to 6 weeks.  Keep active but avoid any activities that cause pain.  Apply moist heat to the low back area several times daily.  Call or return to clinic prn if these symptoms worsen or fail to improve as anticipated.

## 2010-05-01 ENCOUNTER — Telehealth: Payer: Self-pay | Admitting: *Deleted

## 2010-05-01 MED ORDER — HYDROCODONE-ACETAMINOPHEN 5-500 MG PO TABS: 1.0000 | ORAL_TABLET | Freq: Four times a day (QID) | ORAL | Status: AC | PRN

## 2010-05-01 NOTE — Telephone Encounter (Signed)
Continue alleve 2 BID  Please call in a prescription for generic Vicodin 5-500   #50 one or 2 every 6 hours as needed for pain

## 2010-05-01 NOTE — Telephone Encounter (Signed)
Spoke with pt - informed of rx called [facility]

## 2010-05-01 NOTE — Telephone Encounter (Signed)
Tramadol and muscle relaxers are not helping and and wants stronger pills.  " Back Pain"

## 2010-05-11 ENCOUNTER — Emergency Department (HOSPITAL_COMMUNITY): Payer: PRIVATE HEALTH INSURANCE

## 2010-05-11 ENCOUNTER — Emergency Department (HOSPITAL_COMMUNITY)
Admission: EM | Admit: 2010-05-11 | Discharge: 2010-05-12 | Disposition: A | Discharge status: Home / Self Care | Payer: PRIVATE HEALTH INSURANCE | Attending: Emergency Medicine | Admitting: Emergency Medicine

## 2010-05-11 DIAGNOSIS — R5381 Other malaise: Secondary | ICD-10-CM | POA: Insufficient documentation

## 2010-05-11 DIAGNOSIS — R05 Cough: Secondary | ICD-10-CM | POA: Insufficient documentation

## 2010-05-11 DIAGNOSIS — IMO0001 Reserved for inherently not codable concepts without codable children: Secondary | ICD-10-CM | POA: Insufficient documentation

## 2010-05-11 DIAGNOSIS — K5900 Constipation, unspecified: Secondary | ICD-10-CM | POA: Insufficient documentation

## 2010-05-11 DIAGNOSIS — M79609 Pain in unspecified limb: Secondary | ICD-10-CM | POA: Insufficient documentation

## 2010-05-11 DIAGNOSIS — R109 Unspecified abdominal pain: Secondary | ICD-10-CM | POA: Insufficient documentation

## 2010-05-11 DIAGNOSIS — R000 Tachycardia, unspecified: Secondary | ICD-10-CM | POA: Insufficient documentation

## 2010-05-11 DIAGNOSIS — I10 Essential (primary) hypertension: Secondary | ICD-10-CM | POA: Insufficient documentation

## 2010-05-11 DIAGNOSIS — R509 Fever, unspecified: Secondary | ICD-10-CM | POA: Insufficient documentation

## 2010-05-11 DIAGNOSIS — R5383 Other fatigue: Secondary | ICD-10-CM | POA: Insufficient documentation

## 2010-05-11 DIAGNOSIS — R51 Headache: Secondary | ICD-10-CM | POA: Insufficient documentation

## 2010-05-11 DIAGNOSIS — J111 Influenza due to unidentified influenza virus with other respiratory manifestations: Secondary | ICD-10-CM | POA: Insufficient documentation

## 2010-05-11 DIAGNOSIS — R61 Generalized hyperhidrosis: Secondary | ICD-10-CM | POA: Insufficient documentation

## 2010-05-11 DIAGNOSIS — R059 Cough, unspecified: Secondary | ICD-10-CM | POA: Insufficient documentation

## 2010-05-11 DIAGNOSIS — Z79899 Other long term (current) drug therapy: Secondary | ICD-10-CM | POA: Insufficient documentation

## 2010-05-11 LAB — CBC
HCT: 43.9 % (ref 39.0–52.0)
MCH: 30.9 pg (ref 26.0–34.0)
MCV: 88.7 fL (ref 78.0–100.0)
Platelets: 186 10*3/uL (ref 150–400)
RDW: 13.4 % (ref 11.5–15.5)
WBC: 12.7 10*3/uL — ABNORMAL HIGH (ref 4.0–10.5)

## 2010-05-11 LAB — DIFFERENTIAL
Basophils Absolute: 0 10*3/uL (ref 0.0–0.1)
Lymphocytes Relative: 18 % (ref 12–46)
Lymphs Abs: 2.3 10*3/uL (ref 0.7–4.0)
Monocytes Absolute: 1.5 10*3/uL — ABNORMAL HIGH (ref 0.1–1.0)
Monocytes Relative: 12 % (ref 3–12)
Neutro Abs: 8.8 10*3/uL — ABNORMAL HIGH (ref 1.7–7.7)
Neutrophils Relative %: 69 % (ref 43–77)

## 2010-05-11 LAB — URINALYSIS, ROUTINE W REFLEX MICROSCOPIC
Nitrite: NEGATIVE
Protein, ur: NEGATIVE mg/dL
Specific Gravity, Urine: 1.006 (ref 1.005–1.030)
Urobilinogen, UA: 2 mg/dL — ABNORMAL HIGH (ref 0.0–1.0)

## 2010-05-11 LAB — POCT I-STAT, CHEM 8
BUN: 12 mg/dL (ref 6–23)
Calcium, Ion: 1.04 mmol/L — ABNORMAL LOW (ref 1.12–1.32)
Chloride: 99 mEq/L (ref 96–112)
Glucose, Bld: 106 mg/dL — ABNORMAL HIGH (ref 70–99)
Potassium: 3.9 mEq/L (ref 3.5–5.1)

## 2010-05-11 LAB — URINE MICROSCOPIC-ADD ON

## 2010-05-12 ENCOUNTER — Telehealth: Payer: Self-pay | Admitting: *Deleted

## 2010-05-12 NOTE — Telephone Encounter (Signed)
Pt. Wants to let Dr. Kirtland Bouchard know he was diagnosed with the flu in the ER last night and given TAmiflu.

## 2010-05-16 ENCOUNTER — Ambulatory Visit (INDEPENDENT_AMBULATORY_CARE_PROVIDER_SITE_OTHER): Payer: PRIVATE HEALTH INSURANCE | Admitting: Internal Medicine

## 2010-05-16 ENCOUNTER — Encounter: Payer: Self-pay | Admitting: Internal Medicine

## 2010-05-16 DIAGNOSIS — IMO0002 Reserved for concepts with insufficient information to code with codable children: Secondary | ICD-10-CM

## 2010-05-16 DIAGNOSIS — F528 Other sexual dysfunction not due to a substance or known physiological condition: Secondary | ICD-10-CM

## 2010-05-16 DIAGNOSIS — J111 Influenza due to unidentified influenza virus with other respiratory manifestations: Secondary | ICD-10-CM

## 2010-05-16 MED ORDER — HYDROCODONE-HOMATROPINE 5-1.5 MG/5ML PO SYRP: 5.0000 mL | ORAL_SOLUTION | Freq: Four times a day (QID) | ORAL | Status: AC | PRN

## 2010-05-16 NOTE — Progress Notes (Signed)
  Subjective:    Patient ID: Justin Norman, male    DOB: 1970/12/18, 40 y.o.   MRN: [id number]  HPI  is a 40 year old patient who is seen today in followup. He has history of hypertension as well as erectile dysfunction. He was seen in the ED on March 29 with a chief complaint of fever myalgias chills and cough. He was diagnosed with influenza and has one more day of Tamiflu. He is improved but still weak and with cough. He still smokes but has cut his smoking consumption down his cough persists but remains nonproductive he has had no recurrent fever or chills and does feel improved he has missed the last 2 days of work ED records reviewed   Review of Systems  Constitutional: Positive for fatigue. Negative for fever, chills and appetite change.  HENT: Negative for hearing loss, ear pain, congestion, sore throat, trouble swallowing, neck stiffness, dental problem, voice change and tinnitus.   Eyes: Negative for pain, discharge and visual disturbance.  Respiratory: Positive for cough. Negative for chest tightness, wheezing and stridor.   Cardiovascular: Negative for chest pain, palpitations and leg swelling.  Gastrointestinal: Negative for nausea, vomiting, abdominal pain, diarrhea, constipation, blood in stool and abdominal distention.  Genitourinary: Negative for urgency, hematuria, flank pain, discharge, difficulty urinating and genital sores.  Musculoskeletal: Negative for myalgias, back pain, joint swelling, arthralgias and gait problem.  Skin: Negative for rash.  Neurological: Positive for weakness. Negative for dizziness, syncope, speech difficulty, numbness and headaches.  Hematological: Negative for adenopathy. Does not bruise/bleed easily.  Psychiatric/Behavioral: Negative for behavioral problems and dysphoric mood. The patient is not nervous/anxious.        Objective:   Physical Exam  Constitutional: He is oriented to person, place, and time. He appears well-developed and  well-nourished. No distress.  HENT:  Head: Normocephalic.  Right Ear: External ear normal.  Left Ear: External ear normal.  Eyes: Conjunctivae and EOM are normal.  Neck: Normal range of motion.  Cardiovascular: Normal rate and normal heart sounds.   Pulmonary/Chest: Breath sounds normal. No respiratory distress. He has no wheezes.  Abdominal: Bowel sounds are normal.  Musculoskeletal: Normal range of motion. He exhibits no edema and no tenderness.  Neurological: He is alert and oriented to person, place, and time.  Psychiatric: He has a normal mood and affect. His behavior is normal.          Assessment & Plan:  Status post influenza. Clinically improved. Will complete his course of Tamiflu. He'll continue with rest symptomatic treatment. He'll return to work after [place] and 6 days. A note for work dispensed Hypertension. Blood pressure is normal today he states that he has been off this medication for 2 weeks will resume when his appetite and oral intake has normalized Erectile dysfunction

## 2010-05-16 NOTE — Patient Instructions (Signed)
Get plenty of rest, Drink lots of  clear liquids, and use Tylenol or ibuprofen for fever and discomfort.    Return to work on Monday, April 9 if improved..Limit your sodium (Salt) intake  Please check your blood pressure on a regular basis.  If it is consistently greater than 150/90, please make an office appointment.  Smoking tobacco is very bad for your health. You should stop smoking immediately.  Return in 4 months for follow-up

## 2010-06-27 NOTE — Assessment & Plan Note (Signed)
[facility]                            [facility] OFFICE NOTE   CHATHAM, HOWINGTON                     MRN:          [id number]  DATE:08/05/2006                            DOB:          09-24-1970    The patient is a 40 year old black gentleman seen today for a wellness  exam.  He was seen over a year ago and placed on Altace for  hypertension.  He failed to return for followup.  He does continue to  monitor blood pressures and they do remain consistently high.  He felt  he had some significant ED on the medication, but this has persisted  since discontinuation.  He has a history of ongoing tobacco use and a  history of some situational stress and depression; the latter has  improved.  He has gained 21 pounds over the past year and is now at 321.  He continues to smoke.   FAMILY HISTORY:  Unchanged.   PHYSICAL EXAMINATION:  Exam revealed blood pressure to be 140/90,  slightly higher on arrival, weight 321.  Fundi, ears, nose and throat clear.  He has some nasal congestion.  He  did have a low-hanging soft palate, but did move upward well with  inspiration.  NECK:  Stocky, but no adenopathy or thyroid enlargement.  CHEST:  Clear.  CARDIOVASCULAR:  Normal heart sounds.  No murmurs or gallops.  ABDOMEN:  Obese, soft and nontender.  Somewhat tight and tense, but no  organomegaly.  EXTERNAL GENITALIA:  Normal.  EXTREMITIES:  Negative.  No edema.   IMPRESSION:  1. Hypertension.  2. Erectile dysfunction.  3. Esophageal obstruction.  4. Ongoing tobacco use.  5. Rule out obstructive sleep apnea.   DISPOSITION:  He is agreeable to a sleep study.  We will place on  hydrochlorothiazide 25 mg daily.  He has been asked to return in 6 weeks  for followup.  He was given samples and a prescription also for Cialis.     Gordy Savers, MD  Electronically Signed    PFK/MedQ  DD: 08/05/2006  DT: 08/06/2006  Job #: [id number]

## 2010-09-18 ENCOUNTER — Inpatient Hospital Stay (INDEPENDENT_AMBULATORY_CARE_PROVIDER_SITE_OTHER)
Admission: RE | Admit: 2010-09-18 | Discharge: 2010-09-18 | Disposition: A | Discharge status: Home / Self Care | Payer: PRIVATE HEALTH INSURANCE | Source: Ambulatory Visit | Attending: Emergency Medicine | Admitting: Emergency Medicine

## 2010-09-18 DIAGNOSIS — S335XXA Sprain of ligaments of lumbar spine, initial encounter: Secondary | ICD-10-CM

## 2010-11-27 LAB — POCT CARDIAC MARKERS: Operator id: 192351

## 2011-01-01 ENCOUNTER — Encounter: Payer: Self-pay | Admitting: Internal Medicine

## 2011-01-01 ENCOUNTER — Ambulatory Visit (INDEPENDENT_AMBULATORY_CARE_PROVIDER_SITE_OTHER): Payer: PRIVATE HEALTH INSURANCE | Admitting: Internal Medicine

## 2011-01-01 VITALS — BP 180/100 | HR 88 | Temp 98.3°F | Resp 22

## 2011-01-01 DIAGNOSIS — IMO0002 Reserved for concepts with insufficient information to code with codable children: Secondary | ICD-10-CM

## 2011-01-01 MED ORDER — LISINOPRIL-HYDROCHLOROTHIAZIDE 20-25 MG PO TABS: 1.0000 | ORAL_TABLET | Freq: Every day | ORAL | Status: DC

## 2011-01-01 MED ORDER — HYDROCODONE-HOMATROPINE 5-1.5 MG/5ML PO SYRP: 5.0000 mL | ORAL_SOLUTION | Freq: Four times a day (QID) | ORAL | Status: AC | PRN

## 2011-01-01 MED ORDER — AZITHROMYCIN 250 MG PO TABS: ORAL_TABLET | ORAL | Status: AC

## 2011-01-01 NOTE — Patient Instructions (Signed)
Get plenty of rest, Drink lots of  clear liquids, and use Tylenol or ibuprofen for fever and discomfort.    Take your antibiotic as prescribed until ALL of it is gone, but stop if you develop a rash, swelling, or any side effects of the medication.  Contact our office as soon as possible if  there are side effects of the medication.  Limit your sodium (Salt) intake  Please check your blood pressure on a regular basis.  If it is consistently greater than 150/90, please make an office appointment.    It is important that you exercise regularly, at least 20 minutes 3 to 4 times per week.  If you develop chest pain or shortness of breath seek  medical attention.  You need to lose weight.  Consider a lower calorie diet and regular exercise.  Return in 3 months for follow-up

## 2011-01-01 NOTE — Progress Notes (Signed)
  Subjective:    Patient ID: Justin Norman, male    DOB: 08-24-1970, 40 y.o.   MRN: [id number]  HPI  40 year old patient who has treated hypertension. He was last seen about 6 months ago recovering from a flu syndrome. Blood pressure is well-controlled at that time. He has however stopped his medication He presents with a three-week history of head and chest congestion he has had increasing productive cough and a general sense of unwellness. He has had intermittent low-grade fever. Cough is productive of yellow sputum and he has noticed some occasional wheezing. He is  a low-volume smoker.    Review of Systems  Constitutional: Positive for fever, activity change, appetite change and fatigue. Negative for chills.  HENT: Positive for congestion, voice change and sinus pressure. Negative for hearing loss, ear pain, sore throat, trouble swallowing, neck stiffness, dental problem and tinnitus.   Eyes: Negative for pain, discharge and visual disturbance.  Respiratory: Positive for cough. Negative for chest tightness, wheezing and stridor.   Cardiovascular: Negative for chest pain, palpitations and leg swelling.  Gastrointestinal: Negative for nausea, vomiting, abdominal pain, diarrhea, constipation, blood in stool and abdominal distention.  Genitourinary: Negative for urgency, hematuria, flank pain, discharge, difficulty urinating and genital sores.  Musculoskeletal: Negative for myalgias, back pain, joint swelling, arthralgias and gait problem.  Skin: Negative for rash.  Neurological: Negative for dizziness, syncope, speech difficulty, weakness, numbness and headaches.  Hematological: Negative for adenopathy. Does not bruise/bleed easily.  Psychiatric/Behavioral: Negative for behavioral problems and dysphoric mood. The patient is not nervous/anxious.        Objective:   Physical Exam  Constitutional: He is oriented to person, place, and time. He appears well-developed.  HENT:  Head:  Normocephalic.  Right Ear: External ear normal.  Left Ear: External ear normal.  Eyes: Conjunctivae and EOM are normal.  Neck: Normal range of motion.  Cardiovascular: Normal rate and normal heart sounds.   Pulmonary/Chest: He has wheezes.  Abdominal: Bowel sounds are normal.  Musculoskeletal: Normal range of motion. He exhibits no edema and no tenderness.  Neurological: He is alert and oriented to person, place, and time.  Psychiatric: He has a normal mood and affect. His behavior is normal.          Assessment & Plan:   Asthmatic bronchitis. We'll treat with expectorants and a Z-Pak. Total smoking cessation encouraged Hypertension poorly controlled off medication. We'll resume his antihypertensive medication  Low salt diet encouraged Weight loss encouraged

## 2011-01-05 ENCOUNTER — Ambulatory Visit (INDEPENDENT_AMBULATORY_CARE_PROVIDER_SITE_OTHER): Payer: PRIVATE HEALTH INSURANCE | Admitting: Internal Medicine

## 2011-01-05 ENCOUNTER — Encounter: Payer: Self-pay | Admitting: Internal Medicine

## 2011-01-05 VITALS — BP 110/70 | Temp 98.3°F

## 2011-01-05 DIAGNOSIS — R319 Hematuria, unspecified: Secondary | ICD-10-CM

## 2011-01-05 DIAGNOSIS — I10 Essential (primary) hypertension: Secondary | ICD-10-CM

## 2011-01-05 LAB — POCT URINALYSIS DIPSTICK
Bilirubin, UA: NEGATIVE
Ketones, UA: NEGATIVE
Nitrite, UA: NEGATIVE
Spec Grav, UA: 1.015
Urobilinogen, UA: 0.2
pH, UA: 6

## 2011-01-05 NOTE — Progress Notes (Signed)
  Subjective:    Patient ID: Justin Norman, male    DOB: 06/24/1970, 40 y.o.   MRN: [id number]  HPI  40 year old patient who is seen today for followup. 2 days ago he noticed some increased urinary frequency and some mild pressure sensation in the urethra. He had 2 episodes of gross hematuria at the present time his urination has normalized and there's been no further gross bleeding. A UA was reviewed today and revealed no inflammatory cells. There is still 2+ hematuria. No prior history of kidney stones. Denies any fever or flank pain    Review of Systems  Genitourinary: Positive for dysuria, frequency, hematuria and difficulty urinating.       Objective:   Physical Exam  Constitutional:       Obese. Normal blood pressure. No distress          Assessment & Plan:   Gross hematuria associated with some urinary urgency and mild dysuria. No history of kidney stone. Urinalysis now is unremarkable except for trace occult blood. Patient probably passed a stone. We'll continue to force fluids and observed Hypertension stable

## 2011-01-05 NOTE — Patient Instructions (Signed)
Drink as much fluid as you  can tolerate over the next few days  Limit your sodium (Salt) intake  Please check your blood pressure on a regular basis.  If it is consistently greater than 150/90, please make an office appointment.

## 2011-01-26 ENCOUNTER — Other Ambulatory Visit: Payer: Self-pay | Admitting: Internal Medicine

## 2011-01-26 NOTE — Telephone Encounter (Signed)
[facility]. Thanks.

## 2011-01-29 MED ORDER — HYDROCODONE-HOMATROPINE 5-1.5 MG/5ML PO SYRP: 5.0000 mL | ORAL_SOLUTION | Freq: Four times a day (QID) | ORAL | Status: DC | PRN

## 2011-01-29 NOTE — Telephone Encounter (Signed)
Pt called to check on status of getting refill of HYDROcodone-homatropine (HYCODAN) 5-1.5 MG/5ML syrup to [facility]. Pt is still coughing and has chest congestion.

## 2011-01-29 NOTE — Telephone Encounter (Signed)
Hydromet 6 ounces 1 teaspoon  every 6 hours as needed for cough and congestion 

## 2011-01-29 NOTE — Telephone Encounter (Signed)
I am not sure why this ended up with so many other providers -  Last seen 01/05/11  Last written 01/01/11 please advise

## 2011-01-29 NOTE — Telephone Encounter (Signed)
Called in.

## 2011-02-02 ENCOUNTER — Encounter: Payer: Self-pay | Admitting: Internal Medicine

## 2011-02-02 ENCOUNTER — Ambulatory Visit (INDEPENDENT_AMBULATORY_CARE_PROVIDER_SITE_OTHER): Payer: PRIVATE HEALTH INSURANCE | Admitting: Internal Medicine

## 2011-02-02 DIAGNOSIS — J069 Acute upper respiratory infection, unspecified: Secondary | ICD-10-CM

## 2011-02-02 DIAGNOSIS — I10 Essential (primary) hypertension: Secondary | ICD-10-CM

## 2011-02-02 MED ORDER — HYDROCODONE-HOMATROPINE 5-1.5 MG/5ML PO SYRP: 5.0000 mL | ORAL_SOLUTION | Freq: Four times a day (QID) | ORAL | Status: AC | PRN

## 2011-02-02 MED ORDER — HYDROCODONE-HOMATROPINE 5-1.5 MG/5ML PO SYRP: 5.0000 mL | ORAL_SOLUTION | Freq: Four times a day (QID) | ORAL | Status: DC | PRN

## 2011-02-02 NOTE — Progress Notes (Signed)
  Subjective:    Patient ID: Justin Norman, male    DOB: 23-Mar-1970, 40 y.o.   MRN: [id number]  HPI 40 year old patient who has a history of hypertension who presents with a 3 to four-day history of chest congestion and cough. He has had low-grade fever malaise and weakness. Cough has been productive of small amounts of yellow sputum. He denies any wheezing shortness of breath or chest pain.   Review of Systems  Constitutional: Positive for fatigue. Negative for fever, chills and appetite change.  HENT: Positive for congestion. Negative for hearing loss, ear pain, sore throat, trouble swallowing, neck stiffness, dental problem, voice change and tinnitus.   Eyes: Negative for pain, discharge and visual disturbance.  Respiratory: Positive for cough (productive of small amount of yellow sputum). Negative for chest tightness, wheezing and stridor.   Cardiovascular: Negative for chest pain, palpitations and leg swelling.  Gastrointestinal: Negative for nausea, vomiting, abdominal pain, diarrhea, constipation, blood in stool and abdominal distention.  Genitourinary: Negative for urgency, hematuria, flank pain, discharge, difficulty urinating and genital sores.  Musculoskeletal: Negative for myalgias, back pain, joint swelling, arthralgias and gait problem.  Skin: Negative for rash.  Neurological: Negative for dizziness, syncope, speech difficulty, weakness, numbness and headaches.  Hematological: Negative for adenopathy. Does not bruise/bleed easily.  Psychiatric/Behavioral: Negative for behavioral problems and dysphoric mood. The patient is not nervous/anxious.        Objective:   Physical Exam  Constitutional: He is oriented to person, place, and time. He appears well-developed.       Blood pressure 138/84  HENT:  Head: Normocephalic.  Right Ear: External ear normal.  Left Ear: External ear normal.  Eyes: Conjunctivae and EOM are normal.  Neck: Normal range of motion.  Cardiovascular:  Normal rate and normal heart sounds.   Pulmonary/Chest: Breath sounds normal.  Abdominal: Bowel sounds are normal.  Musculoskeletal: Normal range of motion. He exhibits no edema and no tenderness.  Neurological: He is alert and oriented to person, place, and time.  Psychiatric: He has a normal mood and affect. His behavior is normal.          Assessment & Plan:   Viral URI. Will treated symptomatically Hypertension stable  Recheck 6 months

## 2011-02-02 NOTE — Patient Instructions (Signed)
Get plenty of rest, Drink lots of  clear liquids, and use Tylenol or ibuprofen for fever and discomfort.    Call or return to clinic prn if these symptoms worsen or fail to improve as anticipated.  

## 2011-02-13 DIAGNOSIS — G4730 Sleep apnea, unspecified: Secondary | ICD-10-CM

## 2011-02-13 HISTORY — DX: Sleep apnea, unspecified: G47.30

## 2011-06-19 ENCOUNTER — Other Ambulatory Visit (INDEPENDENT_AMBULATORY_CARE_PROVIDER_SITE_OTHER): Payer: PRIVATE HEALTH INSURANCE

## 2011-06-19 DIAGNOSIS — Z79899 Other long term (current) drug therapy: Secondary | ICD-10-CM

## 2011-06-19 DIAGNOSIS — Z0000 Encounter for general adult medical examination without abnormal findings: Secondary | ICD-10-CM

## 2011-06-19 LAB — POCT URINALYSIS DIPSTICK
Bilirubin, UA: NEGATIVE
Blood, UA: NEGATIVE
Ketones, UA: NEGATIVE
Leukocytes, UA: NEGATIVE
Nitrite, UA: POSITIVE
Protein, UA: NEGATIVE
pH, UA: 6.5

## 2011-06-19 LAB — CBC WITH DIFFERENTIAL/PLATELET
Basophils Absolute: 0 10*3/uL (ref 0.0–0.1)
Eosinophils Absolute: 0.3 10*3/uL (ref 0.0–0.7)
HCT: 48.1 % (ref 39.0–52.0)
Lymphs Abs: 1.9 10*3/uL (ref 0.7–4.0)
MCHC: 32.8 g/dL (ref 30.0–36.0)
MCV: 93.8 fl (ref 78.0–100.0)
Monocytes Absolute: 0.5 10*3/uL (ref 0.1–1.0)
Monocytes Relative: 8.2 % (ref 3.0–12.0)
Platelets: 194 10*3/uL (ref 150.0–400.0)
RBC: 5.13 Mil/uL (ref 4.22–5.81)
RDW: 14.9 % — ABNORMAL HIGH (ref 11.5–14.6)
WBC: 5.5 10*3/uL (ref 4.5–10.5)

## 2011-06-19 LAB — BASIC METABOLIC PANEL
Calcium: 9.2 mg/dL (ref 8.4–10.5)
Creatinine, Ser: 1.1 mg/dL (ref 0.4–1.5)
GFR: 98.7 mL/min (ref >60.00)
Glucose, Bld: 81 mg/dL (ref 70–99)

## 2011-06-19 LAB — HEPATIC FUNCTION PANEL
ALT: 29 U/L (ref 0–53)
AST: 21 U/L (ref 0–37)
Albumin: 3.8 g/dL (ref 3.5–5.2)
Alkaline Phosphatase: 41 U/L (ref 39–117)

## 2011-06-19 LAB — LIPID PANEL
Cholesterol: 202 mg/dL — ABNORMAL HIGH (ref 0–200)
Total CHOL/HDL Ratio: 3
Triglycerides: 61 mg/dL (ref 0.0–149.0)

## 2011-06-27 ENCOUNTER — Ambulatory Visit (INDEPENDENT_AMBULATORY_CARE_PROVIDER_SITE_OTHER): Payer: PRIVATE HEALTH INSURANCE | Admitting: Internal Medicine

## 2011-06-27 ENCOUNTER — Encounter: Payer: Self-pay | Admitting: Internal Medicine

## 2011-06-27 VITALS — BP 160/100 | HR 84 | Temp 97.8°F | Resp 20 | Ht 73.5 in | Wt 362.0 lb

## 2011-06-27 DIAGNOSIS — E291 Testicular hypofunction: Secondary | ICD-10-CM

## 2011-06-27 DIAGNOSIS — Z0000 Encounter for general adult medical examination without abnormal findings: Secondary | ICD-10-CM

## 2011-06-27 DIAGNOSIS — E349 Endocrine disorder, unspecified: Secondary | ICD-10-CM

## 2011-06-27 MED ORDER — TRIAMCINOLONE ACETONIDE 0.1 % EX CREA: TOPICAL_CREAM | Freq: Two times a day (BID) | CUTANEOUS | Status: AC

## 2011-06-27 MED ORDER — TESTOSTERONE CYPIONATE 200 MG/ML IM SOLN: 200.0000 mg | INTRAMUSCULAR | Status: DC

## 2011-06-27 MED ORDER — LISINOPRIL-HYDROCHLOROTHIAZIDE 20-25 MG PO TABS: 1.0000 | ORAL_TABLET | Freq: Every day | ORAL | Status: DC

## 2011-06-27 NOTE — Progress Notes (Signed)
Subjective:    Patient ID: Justin Norman, male    DOB: February 01, 1971, 41 y.o.   MRN: [id number]  HPI History of Present Illness:   41 year-old patient has a history of hypertension, testosterone deficiency and EDD is seen today for an annual physical. He has a history of exogenous obesity. He has been off all his medications.   Preventive Screening-Counseling & Management  Alcohol-Tobacco  Smoking Cessation Counseling: yes   Allergies (verified):  No Known Drug Allergies   Past History:  Past Medical History:  Hypertension  testosterone deficiency with ED  Headache  history tobacco use  anal fissure  morbid obesity   Past Surgical History:  none   Family History:  Reviewed history from 08/24/2008 and no changes required.   father age 66 in good health  mother is 53, hypertension, DJD  One brother, positive for arthritis, and hepatitis C, died of sepsis  4 sisters positive for diabetes; s/p PM   Social History:  Reviewed history from 07/03/2007 and no changes required.  Divorced  two children  [employer]  Past Medical History  Diagnosis Date  . ERECTILE DYSFUNCTION 03/03/2007  . HYPERTENSION NEC 03/03/2007  . HYPERTENSION 07/03/2007  . Obesities, morbid     History   Social History  . Marital Status: Married    Spouse Name: N/A    Number of Children: N/A  . Years of Education: N/A   Occupational History  . Not on file.   Social History Main Topics  . Smoking status: Current Everyday Smoker -- 0.3 packs/day    Types: Cigarettes  . Smokeless tobacco: Never Used  . Alcohol Use: Yes  . Drug Use: No  . Sexually Active: Not on file   Other Topics Concern  . Not on file   Social History Narrative  . No narrative on file    No past surgical history on file.  Family History  Problem Relation Age of Onset  . Hypertension Mother   . Arthritis Mother   . Diabetes Sister   . Diabetes Sister   . Diabetes Sister   . Diabetes Sister     No Known  Allergies  Current Outpatient Prescriptions on File Prior to Visit  Medication Sig Dispense Refill  . lisinopril-hydrochlorothiazide (PRINZIDE,ZESTORETIC) 20-25 MG per tablet Take 1 tablet by mouth daily.  90 tablet  6    BP 160/100  Pulse 84  Temp(Src) 97.8 F (36.6 C) (Oral)  Resp 20  Ht 6' 1.5" (1.867 m)  Wt 362 lb (164.202 kg)  BMI 47.11 kg/m2  SpO2 97%     Review of Systems  Constitutional: Negative for fever, chills, activity change, appetite change and fatigue.  HENT: Negative for hearing loss, ear pain, congestion, rhinorrhea, sneezing, mouth sores, trouble swallowing, neck pain, neck stiffness, dental problem, voice change, sinus pressure and tinnitus.   Eyes: Negative for photophobia, pain, redness and visual disturbance.  Respiratory: Negative for apnea, cough, choking, chest tightness, shortness of breath and wheezing.   Cardiovascular: Negative for chest pain, palpitations and leg swelling.  Gastrointestinal: Negative for nausea, vomiting, abdominal pain, diarrhea, constipation, blood in stool, abdominal distention, anal bleeding and rectal pain.  Genitourinary: Negative for dysuria, urgency, frequency, hematuria, flank pain, decreased urine volume, discharge, penile swelling, scrotal swelling, difficulty urinating, genital sores and testicular pain.  Musculoskeletal: Negative for myalgias, back pain, joint swelling, arthralgias and gait problem.  Skin: Negative for color change, rash and wound.  Neurological: Negative for dizziness, tremors,  seizures, syncope, facial asymmetry, speech difficulty, weakness, light-headedness, numbness and headaches.  Hematological: Negative for adenopathy. Does not bruise/bleed easily.  Psychiatric/Behavioral: Negative for suicidal ideas, hallucinations, behavioral problems, confusion, sleep disturbance, self-injury, dysphoric mood, decreased concentration and agitation. The patient is not nervous/anxious.        Objective:   Physical  Exam  Constitutional: He appears well-developed and well-nourished.        Weight 362 blood pressure 134/84    HENT:  Head: Normocephalic and atraumatic.  Right Ear: External ear normal.  Left Ear: External ear normal.  Nose: Nose normal.  Mouth/Throat: Oropharynx is clear and moist.  Eyes: Conjunctivae and EOM are normal. Pupils are equal, round, and reactive to light. No scleral icterus.  Neck: Normal range of motion. Neck supple. No JVD present. No thyromegaly present.  Cardiovascular: Regular rhythm, normal heart sounds and intact distal pulses.  Exam reveals no gallop and no friction rub.   No murmur heard. Pulmonary/Chest: Effort normal and breath sounds normal. He exhibits no tenderness.  Abdominal: Soft. Bowel sounds are normal. He exhibits no distension and no mass. There is no tenderness.  Genitourinary: Prostate normal and penis normal. Guaiac negative stool.  Musculoskeletal: Normal range of motion. He exhibits no edema and no tenderness.  Lymphadenopathy:    He has no cervical adenopathy.  Neurological: He is alert. He has normal reflexes. No cranial nerve deficit. Coordination normal.  Skin: Skin is warm and dry. No rash noted.  Psychiatric: He has a normal mood and affect. His behavior is normal.          Assessment & Plan:   Preventive health examination Morbid obesity Hypertension. Compliance with his medication encouraged. Will refill combination lisinopril hydrochlorothiazide Erectile dysfunction secondary to testosterone deficiency options were discussed. He wishes to try bimonthly testosterone injections

## 2011-06-27 NOTE — Patient Instructions (Signed)
Limit your sodium (Salt) intake    It is important that you exercise regularly, at least 20 minutes 3 to 4 times per week.  If you develop chest pain or shortness of breath seek  medical attention.  You need to lose weight.  Consider a lower calorie diet and regular exercise.  Check a testosterone one-week after an  injection in approximately 4 months

## 2011-08-22 ENCOUNTER — Ambulatory Visit (INDEPENDENT_AMBULATORY_CARE_PROVIDER_SITE_OTHER): Payer: BC Managed Care – PPO | Admitting: Family

## 2011-08-22 ENCOUNTER — Encounter: Payer: Self-pay | Admitting: Family

## 2011-08-22 VITALS — BP 134/92 | Wt 365.0 lb

## 2011-08-22 DIAGNOSIS — E291 Testicular hypofunction: Secondary | ICD-10-CM

## 2011-08-22 DIAGNOSIS — L0390 Cellulitis, unspecified: Secondary | ICD-10-CM

## 2011-08-22 DIAGNOSIS — I10 Essential (primary) hypertension: Secondary | ICD-10-CM

## 2011-08-22 DIAGNOSIS — L0291 Cutaneous abscess, unspecified: Secondary | ICD-10-CM

## 2011-08-22 MED ORDER — CLOTRIMAZOLE-BETAMETHASONE 1-0.05 % EX CREA: TOPICAL_CREAM | Freq: Two times a day (BID) | CUTANEOUS | Status: DC

## 2011-08-22 MED ORDER — SULFAMETHOXAZOLE-TRIMETHOPRIM 800-160 MG PO TABS: 1.0000 | ORAL_TABLET | Freq: Two times a day (BID) | ORAL | Status: AC

## 2011-08-22 NOTE — Progress Notes (Signed)
  Subjective:    Patient ID: Justin Norman, male    DOB: Aug 28, 1970, 41 y.o.   MRN: [id number]  HPI 41 year old [place] American male, nonsmoker, patient of Dr. Kirtland Bouchard. is in today with complaints of pain to his left buttocks times one day. He denies any drainage discharge from the area. Mild redness.  Patient has a history of hypertension. Tolerates meds well. Has not taken his dose today.   Review of Systems  Constitutional: Negative.   Respiratory: Negative.   Cardiovascular: Negative.   Gastrointestinal: Negative.   Genitourinary: Negative.   Musculoskeletal: Negative.   Skin: Positive for rash.       Tenderness to the right buttocks  Neurological: Negative.   Hematological: Negative.   Psychiatric/Behavioral: Negative.   All other systems reviewed and are negative.   Past Medical History  Diagnosis Date  . ERECTILE DYSFUNCTION 03/03/2007  . HYPERTENSION NEC 03/03/2007  . HYPERTENSION 07/03/2007  . Obesities, morbid     History   Social History  . Marital Status: Married    Spouse Name: N/A    Number of Children: N/A  . Years of Education: N/A   Occupational History  . Not on file.   Social History Main Topics  . Smoking status: Current Everyday Smoker -- 0.3 packs/day    Types: Cigarettes  . Smokeless tobacco: Never Used  . Alcohol Use: Yes  . Drug Use: No  . Sexually Active: Not on file   Other Topics Concern  . Not on file   Social History Narrative  . No narrative on file    No past surgical history on file.  Family History  Problem Relation Age of Onset  . Hypertension Mother   . Arthritis Mother   . Diabetes Sister   . Diabetes Sister   . Diabetes Sister   . Diabetes Sister     No Known Allergies  Current Outpatient Prescriptions on File Prior to Visit  Medication Sig Dispense Refill  . lisinopril-hydrochlorothiazide (PRINZIDE,ZESTORETIC) 20-25 MG per tablet Take 1 tablet by mouth daily.  90 tablet  6  . triamcinolone cream (KENALOG)  0.1 % Apply topically 2 (two) times daily.  45 g  1  . testosterone cypionate (DEPO-TESTOSTERONE) 200 MG/ML injection Inject 1 mL (200 mg total) into the muscle every 14 (fourteen) days.  10 mL  2    BP 134/92  Wt 365 lb (165.563 kg)chart    Objective:   Physical Exam  Constitutional: He is oriented to person, place, and time. He appears well-developed and well-nourished.  Cardiovascular: Normal rate, regular rhythm and normal heart sounds.   Pulmonary/Chest: Effort normal and breath sounds normal.  Neurological: He is alert and oriented to person, place, and time.  Skin: Skin is warm and dry. There is erythema.       Tenderness and mild redness to the medial right gluteal area. No drainage, abscess, or discharge noted.   Psychiatric: He has a normal mood and affect.          Assessment & Plan:  Assessment: Cellulitis, hypertension  Plan: Bactrim DS 1 tablet twice a day x7 days. Encouraged medication compliance on blood pressure medicine. Patient call the office if symptoms worsen or persist. Recheck a schedule, appearing.

## 2011-08-22 NOTE — Patient Instructions (Addendum)

## 2011-08-23 LAB — TESTOSTERONE, FREE, TOTAL, SHBG
Sex Hormone Binding: 32 nmol/L (ref 13–71)
Testosterone-% Free: 1.9 % (ref 1.6–2.9)
Testosterone: 172.59 ng/dL — ABNORMAL LOW (ref 300–890)

## 2011-08-24 ENCOUNTER — Telehealth: Payer: Self-pay

## 2011-08-24 MED ORDER — HYDROCODONE-ACETAMINOPHEN 5-500 MG PO TABS: 1.0000 | ORAL_TABLET | Freq: Three times a day (TID) | ORAL | Status: DC | PRN

## 2011-08-24 NOTE — Telephone Encounter (Signed)
Pt aware of results and verbalized understanding. Pt requested pain meds for abscess. Ok to send vicodin #30

## 2011-08-24 NOTE — Telephone Encounter (Signed)
Message copied by Beverely Low on Fri Aug 24, 2011  4:04 PM ------      Message from: Adline Mango B      Created: Thu Aug 23, 2011  8:44 AM       Increase Testosterone injection to once a week x 4 weeks then biweely. Recheck Testosterone at next OV

## 2011-08-31 ENCOUNTER — Ambulatory Visit (INDEPENDENT_AMBULATORY_CARE_PROVIDER_SITE_OTHER): Payer: BC Managed Care – PPO | Admitting: Internal Medicine

## 2011-08-31 ENCOUNTER — Telehealth: Payer: Self-pay

## 2011-08-31 ENCOUNTER — Encounter: Payer: Self-pay | Admitting: Internal Medicine

## 2011-08-31 VITALS — BP 120/80 | Temp 98.1°F | Wt 353.0 lb

## 2011-08-31 DIAGNOSIS — K612 Anorectal abscess: Secondary | ICD-10-CM

## 2011-08-31 DIAGNOSIS — L0390 Cellulitis, unspecified: Secondary | ICD-10-CM

## 2011-08-31 DIAGNOSIS — K611 Rectal abscess: Secondary | ICD-10-CM

## 2011-08-31 DIAGNOSIS — IMO0002 Reserved for concepts with insufficient information to code with codable children: Secondary | ICD-10-CM

## 2011-08-31 DIAGNOSIS — I10 Essential (primary) hypertension: Secondary | ICD-10-CM

## 2011-08-31 MED ORDER — AMOXICILLIN-POT CLAVULANATE 875-125 MG PO TABS: 1.0000 | ORAL_TABLET | Freq: Two times a day (BID) | ORAL | Status: DC

## 2011-08-31 MED ORDER — TESTOSTERONE CYPIONATE 200 MG/ML IM SOLN: INTRAMUSCULAR | Status: DC

## 2011-08-31 MED ORDER — HYDROCODONE-ACETAMINOPHEN 5-500 MG PO TABS: 1.0000 | ORAL_TABLET | Freq: Three times a day (TID) | ORAL | Status: AC | PRN

## 2011-08-31 NOTE — Patient Instructions (Addendum)
Call in 3 days if there is no clinical improvement for a surgical referral  Take your antibiotic as prescribed until ALL of it is gone, but stop if you develop a rash, swelling, or any side effects of the medication.  Contact our office as soon as possible if  there are side effects of the medication.Sitz Bath A sitz bath is a warm water bath taken in the sitting position that covers only the hips and buttocks. It may be used for either healing or hygiene purposes. Sitz baths are also used to relieve pain, itching, or muscle spasms. The water may contain medicine. Moist heat will help you heal and relax.   HOME CARE INSTRUCTIONS    Fill the bathtub half full with warm water.   Sit in the water and open the drain a little.   Turn on the warm water to keep the tub half full. Keep the water running constantly.   Soak in the water for 15 to 20 minutes.   After the sitz bath, pat the affected area dry first.   Take 3 to 4 sitz baths a day.  SEEK MEDICAL CARE IF:   You get worse instead of better. Stop the sitz baths if you get worse. MAKE SURE YOU:  Understand these instructions.   Will watch your condition.   Will get help right away if you are not doing well or get worse.  Document Released: 10/22/2003 Document Revised: 01/18/2011 Document Reviewed: 04/28/2010 [facility] Patient Information 2012 [place], [place].

## 2011-08-31 NOTE — Progress Notes (Signed)
  Subjective:    Patient ID: Justin Norman, male    DOB: October 08, 1970, 41 y.o.   MRN: [id number]  HPI  41 year old patient who was seen recently and placed on antibiotic therapy for a perirectal abscess. He continues to have increasing pain and swelling. No fever    Review of Systems  Skin:       Worsening perirectal pain       Objective:   Physical Exam  Skin:       A 5-6 cm area of induration noted deep in the left intergluteal region near the rectum. After prep and local anesthesia this area was I&D. Blunt dissection was also undertaken.  The area dressed.          Assessment & Plan:    Perirectal abscess status post I&D. The patient will be placed on Augmentin. He will use sitz baths treatments over the weekend. He will call Monday if there is any worsening for surgical referral

## 2011-08-31 NOTE — Telephone Encounter (Signed)
Pt called to c/o area on left buttock no better. Seems to have gotten worse and he is down to his last abx and is also taking vicodin. He notes that he has to sleep on his stomach and is taking sitz bathes bid but the skin has gotten thin and more painful.   Per Oran Rein, refer to dermatology for cellulitis.  Ref placed and pt aware

## 2011-09-19 ENCOUNTER — Telehealth: Payer: Self-pay | Admitting: Internal Medicine

## 2011-09-19 NOTE — Telephone Encounter (Signed)
Patient calling back. States that where he works is extremely hot, it makes him sweat, and it aggravates the area of the boil. He is in a lot of pain and is following the advise of CAN. However, he is wondering if he could get a work excuse from Dr. Kirtland Bouchard for today and the rest of the week. Please advise. I instructed him that someone would call back and let him know whether he could get an excuse. Thank you. Patient's number: [phone]

## 2011-09-19 NOTE — Telephone Encounter (Signed)
Caller: Justin Norman/Patient; PCP: Venia Minks; CB#: [phone]; ; Patient reports that he had a boil lanced on 09/05/11 and has been taking Amoxicillin to prevent infection. Patient reports that the boil opened up on 09/18/11 and is draining again. Patient reports he took a sitz bath and put a bandage on the area that is draining. Patient reports that any sitting is very painful and now that the boil has opened there isn't the same amount of pain. Emergent symptom of "Rectal symptoms and history of rectal surgery or gastrointestinal procedure in past 5 years and previous treatment plan not working" positive per Rectal Symptoms guideline. Home care advice given and instructed patient to call in 24 hours if symptoms not improving with home care to schedule appointment.

## 2011-09-19 NOTE — Telephone Encounter (Signed)
Pt aware letter ready for pick up 

## 2011-09-19 NOTE — Telephone Encounter (Signed)
Forward to advise 

## 2011-09-19 NOTE — Telephone Encounter (Signed)
Ok; out of work for 1 week please

## 2011-10-03 ENCOUNTER — Telehealth: Payer: Self-pay | Admitting: Internal Medicine

## 2011-10-03 DIAGNOSIS — L0232 Furuncle of buttock: Secondary | ICD-10-CM

## 2011-10-03 MED ORDER — CEPHALEXIN 500 MG PO CAPS: 500.0000 mg | ORAL_CAPSULE | Freq: Four times a day (QID) | ORAL | Status: DC

## 2011-10-03 NOTE — Telephone Encounter (Signed)
PLease advise - bactrim then augmentin

## 2011-10-03 NOTE — Telephone Encounter (Signed)
Pt is aware waiting on MD °

## 2011-10-03 NOTE — Telephone Encounter (Signed)
Pt called and said that the boil is draining again and is req refill of abx. [facility]

## 2011-10-03 NOTE — Telephone Encounter (Signed)
Cephalexin 500 mg #40 one 4 times a day. schedule for general surgical evaluation as soon as possible

## 2011-10-05 ENCOUNTER — Encounter (INDEPENDENT_AMBULATORY_CARE_PROVIDER_SITE_OTHER): Payer: Self-pay | Admitting: Surgery

## 2011-10-05 ENCOUNTER — Ambulatory Visit (INDEPENDENT_AMBULATORY_CARE_PROVIDER_SITE_OTHER): Payer: BC Managed Care – PPO | Admitting: Surgery

## 2011-10-05 VITALS — BP 172/88 | HR 80 | Temp 97.4°F | Resp 12 | Ht 74.0 in | Wt 362.4 lb

## 2011-10-05 DIAGNOSIS — K612 Anorectal abscess: Secondary | ICD-10-CM

## 2011-10-05 DIAGNOSIS — E66813 Obesity, class 3: Secondary | ICD-10-CM

## 2011-10-05 DIAGNOSIS — K611 Rectal abscess: Secondary | ICD-10-CM | POA: Insufficient documentation

## 2011-10-05 MED ORDER — AMOXICILLIN-POT CLAVULANATE 875-125 MG PO TABS: 1.0000 | ORAL_TABLET | Freq: Two times a day (BID) | ORAL | Status: AC

## 2011-10-05 NOTE — Progress Notes (Signed)
Subjective:     Patient ID: Justin Norman, male   DOB: 12-Dec-1970, 41 y.o.   MRN: [id number]  HPI  Justin Norman  [birth year] [id number]  Patient Care Team: Gordy Savers, MD as PCP - General  This patient is a 41 y.o.male who presents today for surgical evaluation at the request of Dr. Lesia Hausen.   Reason for evaluation: Perianal pain and drainage.  Possible abscess.  Patient is a pleasant obese smoking male.  He an episode of an abscess that required drainage around the anus.  It has come back and opened up twice since that time.  He has been on antibiotics x2 different courses.  When I went into the room, he felt like he probably should just go home as he didn't feel any problems right now and thought everything was healed.  After talking to a little while, I suspected he had a fistula and offered to examine him.  He did note he did have some blood when he wipes.  I offered to examine him since he was already here in clinic.  He vacillated and then then decided to go and proceed since he was here in the office.  No personal nor family history of GI/colon cancer, inflammatory bowel disease, irritable bowel syndrome, allergy such as Celiac Sprue, dietary/dairy problems, colitis, ulcers nor gastritis.  No recent sick contacts/gastroenteritis.  No travel outside the country.  No changes in diet.    Patient Active Problem List  Diagnosis  . ERECTILE DYSFUNCTION  . HYPERTENSION  . HYPERTENSION NEC  . Testosterone deficiency  . Perirectal abscess, history of recurrent  . Obesity, Class III, BMI 40-49.9 (morbid obesity)    Past Medical History  Diagnosis Date  . ERECTILE DYSFUNCTION 03/03/2007  . HYPERTENSION NEC 03/03/2007  . HYPERTENSION 07/03/2007  . Obesities, morbid     History reviewed. No pertinent past surgical history.  History   Social History  . Marital Status: Divorced    Spouse Name: N/A    Number of Children: N/A  . Years of Education: N/A    Occupational History  . Not on file.   Social History Main Topics  . Smoking status: Current Everyday Smoker -- 0.3 packs/day    Types: Cigarettes  . Smokeless tobacco: Never Used  . Alcohol Use: Yes  . Drug Use: No  . Sexually Active: Not on file   Other Topics Concern  . Not on file   Social History Narrative  . No narrative on file    Family History  Problem Relation Age of Onset  . Hypertension Mother   . Arthritis Mother   . Diabetes Sister   . Diabetes Sister   . Diabetes Sister   . Diabetes Sister     Current Outpatient Prescriptions  Medication Sig Dispense Refill  . cephALEXin (KEFLEX) 500 MG capsule Take 1 capsule (500 mg total) by mouth 4 (four) times daily.  40 capsule  0  . clotrimazole-betamethasone (LOTRISONE) cream Apply topically 2 (two) times daily.  30 g  0  . lisinopril-hydrochlorothiazide (PRINZIDE,ZESTORETIC) 20-25 MG per tablet Take 1 tablet by mouth daily.  90 tablet  6  . testosterone cypionate (DEPOTESTOTERONE CYPIONATE) 200 MG/ML injection 1.5 mL IM every 2 weeks  10 mL  2  . triamcinolone cream (KENALOG) 0.1 % Apply topically 2 (two) times daily.  45 g  1     No Known Allergies  BP 172/88  Pulse 80  Temp 97.4 F (36.3 C) (Temporal)  Resp 12  Ht 6\' 2"  (1.88 m)  Wt 362 lb 6.4 oz (164.384 kg)  BMI 46.53 kg/m2  No results found.   Review of Systems  Constitutional: Negative for fever, chills and diaphoresis.  HENT: Negative for sore throat, trouble swallowing and neck pain.   Eyes: Negative for photophobia and visual disturbance.  Respiratory: Negative for choking and shortness of breath.   Cardiovascular: Negative for chest pain and palpitations.  Gastrointestinal: Negative for nausea, vomiting, abdominal distention, anal bleeding and rectal pain.  Genitourinary: Negative for dysuria, urgency, difficulty urinating and testicular pain.  Musculoskeletal: Negative for myalgias, arthralgias and gait problem.  Skin: Negative for  color change and rash.  Neurological: Negative for dizziness, speech difficulty, weakness and numbness.  Hematological: Negative for adenopathy.  Psychiatric/Behavioral: Negative for hallucinations, confusion and agitation.       Objective:   Physical Exam  Constitutional: He is oriented to person, place, and time. He appears well-developed and well-nourished. No distress.  HENT:  Head: Normocephalic.  Mouth/Throat: Oropharynx is clear and moist. No oropharyngeal exudate.  Eyes: Conjunctivae and EOM are normal. Pupils are equal, round, and reactive to light. No scleral icterus.  Neck: Normal range of motion. No tracheal deviation present.  Cardiovascular: Normal rate, normal heart sounds and intact distal pulses.   Pulmonary/Chest: Effort normal. No respiratory distress.  Abdominal: Soft. He exhibits no distension. There is no tenderness. Hernia confirmed negative in the right inguinal area and confirmed negative in the left inguinal area.       Incisions clean with normal healing ridges.  No hernias  Genitourinary:     Musculoskeletal: Normal range of motion. He exhibits no tenderness.  Neurological: He is alert and oriented to person, place, and time. No cranial nerve deficit. He exhibits normal muscle tone. Coordination normal.  Skin: Skin is warm and dry. No rash noted. He is not diaphoretic.  Psychiatric: He has a normal mood and affect. His behavior is normal.       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  Warm soaks and compresses as well.  Return to clinic in two weeks.  If the area is healed up, followup when necessary.  If there is persistent tract (as I suspect) then consider examination under anesthesia for probable fistulectomy/ostomy:  The anatomy & physiology of the anorectal region was discussed.  We discussed the pathophysiology of anorectal abscess and  fistula.  Differential diagnosis was discussed.  Natural history progression was discussed.   I stressed the importance of a bowel regimen to have daily soft bowel movements to minimize progression of disease.     The patient's condition is not adequately controlled.  Non-operative treatment has not healed the fistula.  Therefore, I recommended examination under anaesthesia to confirm the diagnosis and treat the fistula.  I discussed techniques that may be required such as fistulotomy, ligation by LIFT technique, and/or seton placement.  Benefits & alternatives discussed.  I noted a good likelihood this will help address the problem, but sometimes repeat operations and prolonged healing times may occur.  Risks such as bleeding, pain, recurrence, reoperation, incontinence, heart attack, death, and other risks were discussed.      Educational handouts further explaining the pathology, treatment options, and bowel regimen were given.  The patient expressed understanding & wishes to proceed.  We will work to coordinate surgery for a mutually convenient time.  STOP SMOKING:  We talked to the patient about the dangers of smoking.  We stressed that tobacco use dramatically increases the risk of peri-operative complications such as infection, tissue necrosis leaving to problems with incision/wound and organ healing, heart attack, stroke, DVT, pulmonary embolism, and death.  We noted there are programs in our community to help stop smoking.

## 2011-10-05 NOTE — Patient Instructions (Signed)
ANORECTAL ABSCESS: POST DRAINAGE CARE INSTRUCTIONS  1. Take your usually prescribed home medications unless otherwise directed. 2. DIET: Follow a light bland diet the first 24 hours after arrival home, such as soup, liquids, crackers, etc.  Be sure to include lots of fluids daily.  Avoid fast food or heavy meals as your are more likely to get nauseated.  Eat a low fat the next few days after surgery.   3. PAIN CONTROL: a. Pain is best controlled by a usual combination of three different methods TOGETHER: i. Ice/Heat ii. Over the counter pain medication iii. Prescription pain medication b. Most patients will experience some swelling and discomfort in the anus/rectal area. and incisions.  Ice packs or heat (30-60 minutes up to 6 times a day) will help. Use ice for the first few days to help decrease swelling and bruising, then switch to heat such as warm towels, sitz baths, warm baths, etc to help relax tight/sore spots and speed recovery.  Some people prefer to use ice alone, heat alone, alternating between ice & heat.  Experiment to what works for you.  Swelling and bruising can take several weeks to resolve.   c. It is helpful to take an over-the-counter pain medication regularly for the first few weeks.  Choose one of the following that works best for you: i. Naproxen (Aleve, etc)  Two 220mg  tabs twice a day ii. Ibuprofen (Advil, etc) Three 200mg  tabs four times a day (every meal & bedtime) iii. Acetaminophen (Tylenol, etc) 500-650mg  four times a day (every meal & bedtime) d. A  prescription for pain medication (such as oxycodone, hydrocodone, etc) should be given to you upon discharge.  Take your pain medication as prescribed.  i. If you are having problems/concerns with the prescription medicine (does not control pain, nausea, vomiting, rash, itching, etc), please call us [phone] to see if we need to switch you to a different pain medicine that will work better for you and/or control your  side effect better. ii. If you need a refill on your pain medication, please contact your pharmacy.  They will contact our office to request authorization. Prescriptions will not be filled after 5 pm or on week-ends. 4. KEEP YOUR BOWELS REGULAR a. The goal is one bowel movement a day b. Avoid getting constipated.  Between the surgery and the pain medications, it is common to experience some constipation.  Increasing fluid intake and taking a fiber supplement (such as Metamucil, Citrucel, FiberCon, MiraLax, etc) 1-2 times a day regularly will usually help prevent this problem from occurring.  A mild laxative (prune juice, Milk of Magnesia, MiraLax, etc) should be taken according to package directions if there are no bowel movements after 48 hours. c. Watch out for diarrhea.  If you have many loose bowel movements, simplify your diet to bland foods & liquids for a few days.  Stop any stool softeners and decrease your fiber supplement.  Switching to mild anti-diarrheal medications (Kayopectate, Pepto Bismol) can help.  If this worsens or does not improve, please call us.  5. Wound Care a. Keep the area clean and dry.  Bathe / shower every day.  Keep the area clean by showering / bathing over the incision / wound.   It is okay to soak an open wound to help wash it.  Wet wipes or showers / gentle washing after bowel movements is often less traumatic than regular toilet paper. b. Expect some drainage.  This should slow down, too, by the  end of the first week of surgery.  Wear an absorbent pad or soft cotton gauze in your underwear until the drainage stops. 6. ACTIVITIES as tolerated:   a. You may resume regular (light) daily activities beginning the next day-such as daily self-care, walking, climbing stairs-gradually increasing activities as tolerated.  If you can walk 30 minutes without difficulty, it is safe to try more intense activity such as jogging, treadmill, bicycling, low-impact aerobics, swimming,  etc. b. Save the most intensive and strenuous activity for last such as sit-ups, heavy lifting, contact sports, etc  Refrain from any heavy lifting or straining until you are off narcotics for pain control.   c. DO NOT PUSH THROUGH PAIN.  Let pain be your guide: If it hurts to do something, don't do it.  Pain is your body warning you to avoid that activity for another week until the pain goes down. d. You may drive when you are no longer taking prescription pain medication, you can comfortably sit for long periods of time, and you can safely maneuver your car and apply brakes. e. Bonita Quin may have sexual intercourse when it is comfortable.  7. FOLLOW UP in our office a. Please call [facility] at [phone] to set up an appointment to see your surgeon in the office for a follow-up appointment approximately 2 weeks after your surgery. b. Make sure that you call for this appointment the day you arrive home to insure a convenient appointment time. 10. IF YOU HAVE DISABILITY OR FAMILY LEAVE FORMS, BRING THEM TO THE OFFICE FOR PROCESSING.  DO NOT GIVE THEM TO YOUR DOCTOR.        WHEN TO CALL us [phone]: 1. Poor pain control 2. Reactions / problems with new medications (rash/itching, nausea, etc)  3. Fever over 101.5 F (38.5 C) 4. Inability to urinate 5. Nausea and/or vomiting 6. Worsening swelling or bruising 7. Continued bleeding from incision. 8. Increased pain, redness, or drainage from the incision  The clinic staff is available to answer your questions during regular business hours (8:30am-5pm).  Please don't hesitate to call and ask to speak to one of our nurses for clinical concerns.   A surgeon from [facility] is always on call at the hospitals   If you have a medical emergency, go to the nearest emergency room or call 911.    [facility] [address], [place], [place]  [address] ? MAIN: [phone] ? TOLL FREE: [phone]  ? FAX [phone] [email or web address]     Peri-Rectal Abscess Your caregiver has diagnosed you as having a peri-rectal abscess. This is an infected area near the rectum that is filled with pus. If the abscess is near the surface of the skin, your caregiver may open (incise) the area and drain the pus. HOME CARE INSTRUCTIONS   If your abscess was opened up and drained. A small piece of gauze may be placed in the opening so that it can drain. Do not remove the gauze unless directed by your caregiver.   A loose dressing may be placed over the abscess site. Change the dressing as often as necessary to keep it clean and dry.   After the drain is removed, the area may be washed with a gentle antiseptic (soap) four times per day.   A warm sitz bath, warm packs or heating pad may be used for pain relief, taking care not to burn yourself.   Return for a wound check in  1 day or as directed.   An "inflatable doughnut" may be used for sitting with added comfort. These can be purchased at a drugstore or medical supply house.   To reduce pain and straining with bowel movements, eat a high fiber diet with plenty of fruits and vegetables. Use stool softeners as recommended by your caregiver. This is especially important if narcotic type pain medications were prescribed as these may cause marked constipation.   Only take over-the-counter or prescription medicines for pain, discomfort, or fever as directed by your caregiver.  SEEK IMMEDIATE MEDICAL CARE IF:   You have increasing pain that is not controlled by medication.   There is increased inflammation (redness), swelling, bleeding, or drainage from the area.   An oral temperature above 102 F (38.9 C) develops.   You develop chills or generalized malaise (feel lethargic or feel "washed out").   You develop any new symptoms (problems) you feel may be related to your present problem.  Document Released: 01/27/2000 Document Revised:  01/18/2011 Document Reviewed: 01/27/2008 [facility] Patient Information 2012 [place], [place].

## 2011-10-22 ENCOUNTER — Ambulatory Visit (INDEPENDENT_AMBULATORY_CARE_PROVIDER_SITE_OTHER): Payer: BC Managed Care – PPO | Admitting: Surgery

## 2011-10-22 ENCOUNTER — Encounter (INDEPENDENT_AMBULATORY_CARE_PROVIDER_SITE_OTHER): Payer: Self-pay | Admitting: Surgery

## 2011-10-22 VITALS — BP 160/94 | HR 63 | Temp 98.0°F | Ht 73.5 in | Wt 365.6 lb

## 2011-10-22 DIAGNOSIS — K611 Rectal abscess: Secondary | ICD-10-CM

## 2011-10-22 DIAGNOSIS — K612 Anorectal abscess: Secondary | ICD-10-CM

## 2011-10-22 NOTE — Progress Notes (Signed)
Subjective:     Patient ID: Justin Norman, male   DOB: [birth year], 41 y.o.   MRN: [id number]  HPI   Justin Norman  1971-01-26 [id number]  Patient Care Team: Gordy Savers, MD as PCP - General  This patient is a 41 y.o.male who presents today for surgical evaluation at the request of Dr. Lesia Hausen.   Reason for evaluation: Followup on perirectal abscess  Patient is a pleasant obese smoking male.  He an episode of an abscess that required drainage around the anus.  It has come back and opened up twice since that time.  He has been on antibiotics x2 different courses.  I saw him.  I was concerned of an abscess.  Recommended antibiotics.  I did not drain it as it was small and spontaneously draining.  He notes that he never got the antibiotics filled.  He feels great.  No drainage.  No fevers or chills.  No tingling or other changes except for one day that and that is gone now.  He's happy right now.  Patient Active Problem List  Diagnosis  . ERECTILE DYSFUNCTION  . HYPERTENSION  . HYPERTENSION NEC  . Testosterone deficiency  . Perirectal abscess, history of recurrent  . Obesity, Class III, BMI 40-49.9 (morbid obesity)    Past Medical History  Diagnosis Date  . ERECTILE DYSFUNCTION 03/03/2007  . HYPERTENSION NEC 03/03/2007  . HYPERTENSION 07/03/2007  . Obesities, morbid     History reviewed. No pertinent past surgical history.  History   Social History  . Marital Status: Divorced    Spouse Name: N/A    Number of Children: N/A  . Years of Education: N/A   Occupational History  . Not on file.   Social History Main Topics  . Smoking status: Current Everyday Smoker -- 0.3 packs/day    Types: Cigarettes  . Smokeless tobacco: Never Used  . Alcohol Use: Yes  . Drug Use: No  . Sexually Active: Not on file   Other Topics Concern  . Not on file   Social History Narrative  . No narrative on file    Family History  Problem Relation Age of Onset  .  Hypertension Mother   . Arthritis Mother   . Diabetes Sister   . Diabetes Sister   . Diabetes Sister   . Diabetes Sister     Current Outpatient Prescriptions  Medication Sig Dispense Refill  . amoxicillin-clavulanate (AUGMENTIN) 875-125 MG per tablet       . clotrimazole-betamethasone (LOTRISONE) cream Apply topically 2 (two) times daily.  30 g  0  . lisinopril-hydrochlorothiazide (PRINZIDE,ZESTORETIC) 20-25 MG per tablet Take 1 tablet by mouth daily.  90 tablet  6  . testosterone cypionate (DEPOTESTOTERONE CYPIONATE) 200 MG/ML injection 1.5 mL IM every 2 weeks  10 mL  2  . triamcinolone cream (KENALOG) 0.1 % Apply topically 2 (two) times daily.  45 g  1     No Known Allergies  BP 160/94  Pulse 63  Temp 98 F (36.7 C) (Temporal)  Ht 6' 1.5" (1.867 m)  Wt 365 lb 9.6 oz (165.835 kg)  BMI 47.58 kg/m2  SpO2 96%  No results found.   Review of Systems  Constitutional: Negative for fever, chills and diaphoresis.  HENT: Negative for sore throat, trouble swallowing and neck pain.   Eyes: Negative for photophobia and visual disturbance.  Respiratory: Negative for choking and shortness of breath.   Cardiovascular: Negative for chest pain and palpitations.  Gastrointestinal: Negative for  nausea, vomiting, abdominal distention, anal bleeding and rectal pain.  Genitourinary: Negative for dysuria, urgency, difficulty urinating and testicular pain.  Musculoskeletal: Negative for myalgias, arthralgias and gait problem.  Skin: Negative for color change and rash.  Neurological: Negative for dizziness, speech difficulty, weakness and numbness.  Hematological: Negative for adenopathy.  Psychiatric/Behavioral: Negative for hallucinations, confusion and agitation.       Objective:   Physical Exam  Constitutional: He is oriented to person, place, and time. He appears well-developed and well-nourished. No distress.  HENT:  Head: Normocephalic.  Mouth/Throat: Oropharynx is clear and moist.  No oropharyngeal exudate.  Eyes: Conjunctivae and EOM are normal. Pupils are equal, round, and reactive to light. No scleral icterus.  Neck: Normal range of motion. No tracheal deviation present.  Cardiovascular: Normal rate, normal heart sounds and intact distal pulses.   Pulmonary/Chest: Effort normal. No respiratory distress.  Abdominal: Soft. He exhibits no distension. There is no tenderness. Hernia confirmed negative in the right inguinal area and confirmed negative in the left inguinal area.       Obese.  No hernias  Genitourinary:       Perianal skin clean with good hygiene.  No pruritis.  No external skin tags / hemorrhoids of significance.  No pilonidal disease.  No fissure.  No abscess/fistula.   Normal sphincter tone.    Musculoskeletal: Normal range of motion. He exhibits no tenderness.  Neurological: He is alert and oriented to person, place, and time. No cranial nerve deficit. He exhibits normal muscle tone. Coordination normal.  Skin: Skin is warm and dry. No rash noted. He is not diaphoretic.  Psychiatric: He has a normal mood and affect. His behavior is normal.       Assessment:     Recurrent perirectal abscess, healed.  No strong evidence of a chronic fistula    Plan:     Activity as tolerated.  Do not push through pain.  Diet as tolerated. Bowel regimen to avoid problems.  Followup when necessary.    If he develops another abscess, then consider examination under anesthesia for probable fistulectomy/ostomy:  The anatomy & physiology of the anorectal region was discussed.  We discussed the pathophysiology of anorectal abscess and fistula.  Differential diagnosis was discussed.  Natural history progression was discussed.   I stressed the importance of a bowel regimen to have daily soft bowel movements to minimize progression of disease.     The patient's condition is not adequately controlled.  Non-operative treatment has not healed the fistula.  Therefore, I  recommended examination under anaesthesia to confirm the diagnosis and treat the fistula.  I discussed techniques that may be required such as fistulotomy, ligation by LIFT technique, and/or seton placement.  Benefits & alternatives discussed.  I noted a good likelihood this will help address the problem, but sometimes repeat operations and prolonged healing times may occur.  Risks such as bleeding, pain, recurrence, reoperation, incontinence, heart attack, death, and other risks were discussed.      Educational handouts further explaining the pathology, treatment options, and bowel regimen were given.  The patient expressed understanding & wishes to proceed.  We will work to coordinate surgery for a mutually convenient time.    STOP SMOKING:  We talked to the patient about the dangers of smoking.  We stressed that tobacco use dramatically increases the risk of peri-operative complications such as infection, tissue necrosis leaving to problems with incision/wound and organ healing, heart attack, stroke, DVT, pulmonary embolism, and death.  We noted there are programs in our community to help stop smoking.

## 2011-10-22 NOTE — Patient Instructions (Addendum)
Peri-Rectal Abscess Your caregiver has diagnosed you as having a peri-rectal abscess. This is an infected area near the rectum that is filled with pus. If the abscess is near the surface of the skin, your caregiver may open (incise) the area and drain the pus. HOME CARE INSTRUCTIONS   If your abscess was opened up and drained. A small piece of gauze may be placed in the opening so that it can drain. Do not remove the gauze unless directed by your caregiver.   A loose dressing may be placed over the abscess site. Change the dressing as often as necessary to keep it clean and dry.   After the drain is removed, the area may be washed with a gentle antiseptic (soap) four times per day.   A warm sitz bath, warm packs or heating pad may be used for pain relief, taking care not to burn yourself.   Return for a wound check in 1 day or as directed.   An "inflatable doughnut" may be used for sitting with added comfort. These can be purchased at a drugstore or medical supply house.   To reduce pain and straining with bowel movements, eat a high fiber diet with plenty of fruits and vegetables. Use stool softeners as recommended by your caregiver. This is especially important if narcotic type pain medications were prescribed as these may cause marked constipation.   Only take over-the-counter or prescription medicines for pain, discomfort, or fever as directed by your caregiver.  SEEK IMMEDIATE MEDICAL CARE IF:   You have increasing pain that is not controlled by medication.   There is increased inflammation (redness), swelling, bleeding, or drainage from the area.   An oral temperature above 102 F (38.9 C) develops.   You develop chills or generalized malaise (feel lethargic or feel "washed out").   You develop any new symptoms (problems) you feel may be related to your present problem.  Document Released: 01/27/2000 Document Revised: 01/18/2011 Document Reviewed: 01/27/2008  GETTING TO GOOD  BOWEL HEALTH. Irregular bowel habits such as constipation and diarrhea can lead to many problems over time.  Having one soft bowel movement a day is the most important way to prevent further problems.  The anorectal canal is designed to handle stretching and feces to safely manage our ability to get rid of solid waste (feces, poop, stool) out of our body.  BUT, hard constipated stools can act like ripping concrete bricks and diarrhea can be a burning fire to this very sensitive area of our body, causing inflamed hemorrhoids, anal fissures, increasing risk is perirectal abscesses, abdominal pain/bloating, an making irritable bowel worse.     The goal: ONE SOFT BOWEL MOVEMENT A DAY!  To have soft, regular bowel movements:    Drink at least 8 tall glasses of water a day.     Take plenty of fiber.  Fiber is the undigested part of plant food that passes into the colon, acting s "natures broom" to encourage bowel motility and movement.  Fiber can absorb and hold large amounts of water. This results in a larger, bulkier stool, which is soft and easier to pass. Work gradually over several weeks up to 6 servings a day of fiber (25g a day even more if needed) in the form of: o Vegetables -- Root (potatoes, carrots, turnips), leafy green (lettuce, salad greens, celery, spinach), or cooked high residue (cabbage, broccoli, etc) o Fruit -- Fresh (unpeeled skin & pulp), Dried (prunes, apricots, cherries, etc ),  or stewed (  applesauce)  o Whole grain breads, pasta, etc (whole wheat)  o Bran cereals    Bulking Agents -- This type of water-retaining fiber generally is easily obtained each day by one of the following:  o Psyllium bran -- The psyllium plant is remarkable because its ground seeds can retain so much water. This product is available as Metamucil, Konsyl, Effersyllium, Per Diem Fiber, or the less expensive generic preparation in drug and health food stores. Although labeled a laxative, it really is not a  laxative.  o Methylcellulose -- This is another fiber derived from wood which also retains water. It is available as Citrucel. o Polyethylene Glycol - and "artificial" fiber commonly called Miralax or Glycolax.  It is helpful for people with gassy or bloated feelings with regular fiber o Flax Seed - a less gassy fiber than psyllium   No reading or other relaxing activity while on the toilet. If bowel movements take longer than 5 minutes, you are too constipated   AVOID CONSTIPATION.  High fiber and water intake usually takes care of this.  Sometimes a laxative is needed to stimulate more frequent bowel movements, but    Laxatives are not a good long-term solution as it can wear the colon out. o Osmotics (Milk of Magnesia, Fleets phosphosoda, Magnesium citrate, MiraLax, GoLytely) are safer than  o Stimulants (Senokot, Castor Oil, Dulcolax, Ex Lax)    o Do not take laxatives for more than 7days in a row.    IF SEVERELY CONSTIPATED, try a Bowel Retraining Program: o Do not use laxatives.  o Eat a diet high in roughage, such as bran cereals and leafy vegetables.  o Drink six (6) ounces of prune or apricot juice each morning.  o Eat two (2) large servings of stewed fruit each day.  o Take one (1) heaping tablespoon of a psyllium-based bulking agent twice a day. Use sugar-free sweetener when possible to avoid excessive calories.  o Eat a normal breakfast.  o Set aside 15 minutes after breakfast to sit on the toilet, but do not strain to have a bowel movement.  o If you do not have a bowel movement by the third day, use an enema and repeat the above steps.    Controlling diarrhea o Switch to liquids and simpler foods for a few days to avoid stressing your intestines further. o Avoid dairy products (especially milk & ice cream) for a short time.  The intestines often can lose the ability to digest lactose when stressed. o Avoid foods that cause gassiness or bloating.  Typical foods include beans and  other legumes, cabbage, broccoli, and dairy foods.  Every person has some sensitivity to other foods, so listen to our body and avoid those foods that trigger problems for you. o Adding fiber (Citrucel, Metamucil, psyllium, Miralax) gradually can help thicken stools by absorbing excess fluid and retrain the intestines to act more normally.  Slowly increase the dose over a few weeks.  Too much fiber too soon can backfire and cause cramping & bloating. o Probiotics (such as active yogurt, Align, etc) may help repopulate the intestines and colon with normal bacteria and calm down a sensitive digestive tract.  Most studies show it to be of mild help, though, and such products can be costly. o Medicines:   Bismuth subsalicylate (ex. Kayopectate, Pepto Bismol) every 30 minutes for up to 6 doses can help control diarrhea.  Avoid if pregnant.   Loperamide (Immodium) can slow down diarrhea.  Start with  two tablets (4mg  total) first and then try one tablet every 6 hours.  Avoid if you are having fevers or severe pain.  If you are not better or start feeling worse, stop all medicines and call your doctor for advice o Call your doctor if you are getting worse or not better.  Sometimes further testing (cultures, endoscopy, X-ray studies, bloodwork, etc) may be needed to help diagnose and treat the cause of the diarrhea. o  [facility] Patient Information [address], [place].

## 2011-10-23 ENCOUNTER — Ambulatory Visit (INDEPENDENT_AMBULATORY_CARE_PROVIDER_SITE_OTHER): Payer: BC Managed Care – PPO | Admitting: Surgery

## 2012-06-20 ENCOUNTER — Other Ambulatory Visit (INDEPENDENT_AMBULATORY_CARE_PROVIDER_SITE_OTHER): Payer: PRIVATE HEALTH INSURANCE

## 2012-06-20 ENCOUNTER — Telehealth: Payer: Self-pay | Admitting: *Deleted

## 2012-06-20 DIAGNOSIS — Z0000 Encounter for general adult medical examination without abnormal findings: Secondary | ICD-10-CM

## 2012-06-20 LAB — LIPID PANEL
Cholesterol: 193 mg/dL (ref 0–200)
HDL: 65.1 mg/dL (ref >39.00)
Triglycerides: 51 mg/dL (ref 0.0–149.0)

## 2012-06-20 LAB — POCT URINALYSIS DIPSTICK
Bilirubin, UA: NEGATIVE
Glucose, UA: NEGATIVE
Spec Grav, UA: 1.025

## 2012-06-20 LAB — HEPATIC FUNCTION PANEL
ALT: 34 U/L (ref 0–53)
AST: 24 U/L (ref 0–37)
Bilirubin, Direct: 0.2 mg/dL (ref 0.0–0.3)
Total Bilirubin: 1 mg/dL (ref 0.3–1.2)

## 2012-06-20 LAB — BASIC METABOLIC PANEL
Calcium: 9.2 mg/dL (ref 8.4–10.5)
GFR: 93.14 mL/min (ref >60.00)
Sodium: 140 mEq/L (ref 135–145)

## 2012-06-20 LAB — TSH: TSH: 1.17 u[IU]/mL (ref 0.35–5.50)

## 2012-06-20 NOTE — Telephone Encounter (Signed)
Left a message for patient. He needs to come back in so that we can draw another tube of blood. The lab was unable to do the CBC.

## 2012-06-27 ENCOUNTER — Encounter: Payer: Self-pay | Admitting: Internal Medicine

## 2012-06-27 ENCOUNTER — Ambulatory Visit (INDEPENDENT_AMBULATORY_CARE_PROVIDER_SITE_OTHER): Payer: PRIVATE HEALTH INSURANCE | Admitting: Internal Medicine

## 2012-06-27 VITALS — BP 142/90 | HR 82 | Temp 98.5°F | Resp 20 | Ht 72.5 in | Wt 362.7 lb

## 2012-06-27 DIAGNOSIS — Z0000 Encounter for general adult medical examination without abnormal findings: Secondary | ICD-10-CM

## 2012-06-27 MED ORDER — "NEEDLE (DISP) 20G X 1"" MISC": Status: DC

## 2012-06-27 MED ORDER — TESTOSTERONE CYPIONATE 200 MG/ML IM SOLN: INTRAMUSCULAR | Status: DC

## 2012-06-27 MED ORDER — "NEEDLE (DISP) 23G X 1"" MISC": Status: DC

## 2012-06-27 MED ORDER — SYRINGE (DISPOSABLE) 3 ML MISC: Status: DC

## 2012-06-27 NOTE — Patient Instructions (Addendum)
Limit your sodium (Salt) intake  You need to lose weight.  Consider a lower calorie diet and regular exercise.    It is important that you exercise regularly, at least 20 minutes 3 to 4 times per week.  If you develop chest pain or shortness of breath seek  medical attention.  Please check your blood pressure on a regular basis.  If it is consistently greater than 150/90, please make an office appointment.  Return in 6 months for follow-up Back Injury Prevention The following tips can help you to prevent a back injury. PHYSICAL FITNESS  Exercise often. Try to develop strong stomach (abdominal) muscles.  Do aerobic exercises often. This includes walking, jogging, biking, swimming.  Do exercises that help with balance and strength often. This includes tai chi and yoga.  Stretch before and after you exercise.  Keep a healthy weight. DIET   Ask your doctor how much calcium and vitamin D you need every day.  Include calcium in your diet. Foods high in calcium include dairy products; green, leafy vegetables; and products with calcium added (fortified).  Include vitamin D in your diet. Foods high in vitamin D include milk and products with vitamin D added.  Think about taking a multivitamin or other nutritional products called " supplements."  Stop smoking if you smoke. POSTURE   Sit and stand up straight. Avoid leaning forward or hunching over.  Choose chairs that support your lower back.  If you work at a desk:  Sit close to your work so you do not lean over.  Keep your chin tucked in.  Keep your neck drawn back.  Keep your elbows bent at a right angle. Your arms should look like the letter "L."  Sit high and close to the steering wheel when you drive. Add low back support to your car seat if needed.  Avoid sitting or standing in one position for too long. Get up and move around every hour. Take breaks if you are driving for a long time.  Sleep on your side with your  knees slightly bent. You can also sleep on your back with a pillow under your knees. Do not sleep on your stomach. LIFTING, TWISTING, AND REACHING  Avoid heavy lifting, especially lifting over and over again. If you must do heavy lifting:  Stretch before lifting.  Work slowly.  Rest between lifts.  Use carts and dollies to move objects when possible.  Make several small trips instead of carrying 1 heavy load.  Ask for help when you need it.  Ask for help when moving big, awkward objects.  Follow these steps when lifting:  Stand with your feet shoulder-width apart.  Get as close to the object as you can. Do not pick up heavy objects that are far from your body.  Use handles or lifting straps when possible.  Bend at your knees. Squat down, but keep your heels off the floor.  Keep your shoulders back, your chin tucked in, and your back straight.  Lift the object slowly. Tighten the muscles in your legs, stomach, and butt. Keep the object as close to the center of your body as possible.  Reverse these directions when you put a load down.  Do not:  Lift the object above your waist.  Twist at the waist while lifting or carrying a load. Move your feet if you need to turn, not your waist.  Bend over without bending at your knees.  Avoid reaching over your head, across a  table, or for an object on a high surface. OTHER TIPS  Avoid wet floors and keep sidewalks clear of ice.  Do not sleep on a mattress that is too soft or too hard.  Keep items that you use often within easy reach.  Put heavier objects on shelves at waist level. Put lighter objects on lower or higher shelves.  Find ways to lessen your stress. You can try exercise, massage, or relaxation.  Get help for depression or anxiety if needed. GET HELP IF:  You injure your back.  You have questions about diet, exercise, or other ways to prevent back injuries. MAKE SURE YOU:  Understand these  instructions.  Will watch your condition.  Will get help right away if you are not doing well or get worse. Document Released: 07/18/2007 Document Revised: 04/23/2011 Document Reviewed: 03/12/2011 [facility] Patient Information 2013 [place], [place].

## 2012-06-27 NOTE — Progress Notes (Signed)
Subjective:    Patient ID: Justin Norman, male    DOB: May 25, 1970, 42 y.o.   MRN: [id number]  HPI  42 year old patient who is seen today for a preventive health examination. He has a history of hypertension morbid obesity. He has been followed by general surgery recently for recurrent perirectal abscesses. He has a history also of testosterone deficiency.  Social history he has been under considerable situational stress. The daughter has become pregnant and recently married. He has placed his mother in a nursing home do to advancing dementia.  Past Medical History  Diagnosis Date  . ERECTILE DYSFUNCTION 03/03/2007  . HYPERTENSION NEC 03/03/2007  . HYPERTENSION 07/03/2007  . Obesities, morbid     History   Social History  . Marital Status: Divorced    Spouse Name: N/A    Number of Children: N/A  . Years of Education: N/A   Occupational History  . Not on file.   Social History Main Topics  . Smoking status: Current Every Day Smoker -- 0.30 packs/day    Types: Cigarettes  . Smokeless tobacco: Never Used  . Alcohol Use: Yes  . Drug Use: No  . Sexually Active: Not on file   Other Topics Concern  . Not on file   Social History Narrative  . No narrative on file    No past surgical history on file.  Family History  Problem Relation Age of Onset  . Hypertension Mother   . Arthritis Mother   . Diabetes Sister   . Diabetes Sister   . Diabetes Sister   . Diabetes Sister     No Known Allergies  Current Outpatient Prescriptions on File Prior to Visit  Medication Sig Dispense Refill  . lisinopril-hydrochlorothiazide (PRINZIDE,ZESTORETIC) 20-25 MG per tablet Take 1 tablet by mouth daily.  90 tablet  6   No current facility-administered medications on file prior to visit.    BP 142/90  Pulse 82  Temp(Src) 98.5 F (36.9 C) (Oral)  Resp 20  Ht 6' 0.5" (1.842 m)  Wt 362 lb 11.2 oz (164.52 kg)  BMI 48.49 kg/m2  SpO2 97%       Review of Systems   Constitutional: Negative for fever, chills, appetite change and fatigue.  HENT: Negative for hearing loss, ear pain, congestion, sore throat, trouble swallowing, neck stiffness, dental problem, voice change and tinnitus.   Eyes: Negative for pain, discharge and visual disturbance.  Respiratory: Negative for cough, chest tightness, wheezing and stridor.   Cardiovascular: Negative for chest pain, palpitations and leg swelling.  Gastrointestinal: Negative for nausea, vomiting, abdominal pain, diarrhea, constipation, blood in stool and abdominal distention.  Genitourinary: Negative for urgency, hematuria, flank pain, discharge, difficulty urinating and genital sores.  Musculoskeletal: Positive for back pain. Negative for myalgias, joint swelling, arthralgias and gait problem.  Skin: Negative for rash.  Neurological: Negative for dizziness, syncope, speech difficulty, weakness, numbness and headaches.  Hematological: Negative for adenopathy. Does not bruise/bleed easily.  Psychiatric/Behavioral: Negative for behavioral problems and dysphoric mood. The patient is not nervous/anxious.        Objective:   Physical Exam  Constitutional: He appears well-developed and well-nourished.  Weight 362 Blood pressure 140/90  HENT:  Head: Normocephalic and atraumatic.  Right Ear: External ear normal.  Left Ear: External ear normal.  Nose: Nose normal.  Mouth/Throat: Oropharynx is clear and moist.  Eyes: Conjunctivae and EOM are normal. Pupils are equal, round, and reactive to light. No scleral icterus.  Neck: Normal range of motion. Neck  supple. No JVD present. No thyromegaly present.  Cardiovascular: Regular rhythm, normal heart sounds and intact distal pulses.  Exam reveals no gallop and no friction rub.   No murmur heard. Pulmonary/Chest: Effort normal and breath sounds normal. He exhibits no tenderness.  Abdominal: Soft. Bowel sounds are normal. He exhibits no distension and no mass. There is no  tenderness.  Genitourinary: Prostate normal and penis normal.  Musculoskeletal: Normal range of motion. He exhibits no edema and no tenderness.  Trace edema  Lymphadenopathy:    He has no cervical adenopathy.  Neurological: He is alert. He has normal reflexes. No cranial nerve deficit. Coordination normal.  Skin: Skin is warm and dry. No rash noted.  Psychiatric: He has a normal mood and affect. His behavior is normal.          Assessment & Plan:   Preventive health exam Morbid obesity Hypertension controlled Testosterone deficiency  The patient is agreeable for dietary counseling. He and his fiance will attend classes Exercise regimen encouraged as well as low-salt diet Recheck 6 months

## 2012-06-30 ENCOUNTER — Telehealth: Payer: Self-pay | Admitting: Internal Medicine

## 2012-06-30 MED ORDER — TRAMADOL HCL 50 MG PO TABS: 50.0000 mg | ORAL_TABLET | Freq: Four times a day (QID) | ORAL | Status: DC | PRN

## 2012-06-30 NOTE — Telephone Encounter (Signed)
Pt was seen on 5-16 and still waiting on rx for back pain to be call into [facility]

## 2012-06-30 NOTE — Telephone Encounter (Signed)
Tramadol 50 mg #100 one every 6 hours as needed for pain

## 2012-06-30 NOTE — Telephone Encounter (Signed)
Left detailed message that Rx requested was sent to pharmacy.

## 2012-08-20 ENCOUNTER — Telehealth: Payer: Self-pay | Admitting: Internal Medicine

## 2012-08-20 NOTE — Telephone Encounter (Signed)
Error/njr °

## 2012-08-21 ENCOUNTER — Ambulatory Visit (INDEPENDENT_AMBULATORY_CARE_PROVIDER_SITE_OTHER): Payer: PRIVATE HEALTH INSURANCE | Admitting: Internal Medicine

## 2012-08-21 ENCOUNTER — Encounter: Payer: Self-pay | Admitting: Internal Medicine

## 2012-08-21 VITALS — BP 150/86 | HR 89 | Temp 98.3°F | Resp 20 | Wt 368.0 lb

## 2012-08-21 DIAGNOSIS — K611 Rectal abscess: Secondary | ICD-10-CM

## 2012-08-21 DIAGNOSIS — K612 Anorectal abscess: Secondary | ICD-10-CM

## 2012-08-21 DIAGNOSIS — E349 Endocrine disorder, unspecified: Secondary | ICD-10-CM

## 2012-08-21 DIAGNOSIS — G4733 Obstructive sleep apnea (adult) (pediatric): Secondary | ICD-10-CM

## 2012-08-21 DIAGNOSIS — IMO0002 Reserved for concepts with insufficient information to code with codable children: Secondary | ICD-10-CM

## 2012-08-21 DIAGNOSIS — E291 Testicular hypofunction: Secondary | ICD-10-CM

## 2012-08-21 LAB — TESTOSTERONE: Testosterone: 101.38 ng/dL — ABNORMAL LOW (ref 350.00–890.00)

## 2012-08-21 MED ORDER — ZOLPIDEM TARTRATE 10 MG PO TABS: 10.0000 mg | ORAL_TABLET | Freq: Every evening | ORAL | Status: DC | PRN

## 2012-08-21 NOTE — Progress Notes (Signed)
Subjective:    Patient ID: Justin Norman, male    DOB: [birth year], 42 y.o.   MRN: [id number]  HPI   41 year old patient who is seen today for followup. He has a history of testosterone deficiency and is seen today for followup. He is approximately 4 days from his next scheduled dental testosterone injection. History complaints today are some lesions involving his left anterior lower leg. It sounds that he has had some soft tissue infections in 1 has recently opened and drained.  He was seen this past spring for an annual exam. He was referred for dietary counseling but apparently this referral was never made  Past Medical History  Diagnosis Date  . ERECTILE DYSFUNCTION 03/03/2007  . HYPERTENSION NEC 03/03/2007  . HYPERTENSION 07/03/2007  . Obesities, morbid     History   Social History  . Marital Status: Divorced    Spouse Name: N/A    Number of Children: N/A  . Years of Education: N/A   Occupational History  . Not on file.   Social History Main Topics  . Smoking status: Current Every Day Smoker -- 0.30 packs/day    Types: Cigarettes  . Smokeless tobacco: Never Used  . Alcohol Use: Yes  . Drug Use: No  . Sexually Active: Not on file   Other Topics Concern  . Not on file   Social History Narrative  . No narrative on file    History reviewed. No pertinent past surgical history.  Family History  Problem Relation Age of Onset  . Hypertension Mother   . Arthritis Mother   . Diabetes Sister   . Diabetes Sister   . Diabetes Sister   . Diabetes Sister     No Known Allergies  Current Outpatient Prescriptions on File Prior to Visit  Medication Sig Dispense Refill  . clotrimazole-betamethasone (LOTRISONE) cream Apply 1 application topically 2 (two) times daily as needed.      [name] lisinopril-hydrochlorothiazide (PRINZIDE,ZESTORETIC) 20-25 MG per tablet Take 1 tablet by mouth daily.  90 tablet  6  . NEEDLE, DISP, 20 G (BD DISP NEEDLES) 20G X 1" MISC Use to draw up  Testosterone medication every 14 days.  50 each  1  . NEEDLE, DISP, 23 G 23G X 1" MISC Use to inject Testosterone every 14 days.  25 each  1  . Syringe, Disposable, (B-D SYRINGE LUER-LOK 3CC) 3 ML MISC Use with Testosterone every 14 days  25 each  1  . testosterone cypionate (DEPOTESTOTERONE CYPIONATE) 200 MG/ML injection 1.5 mL IM every 2 weeks  10 mL  3  . traMADol (ULTRAM) 50 MG tablet Take 1 tablet (50 mg total) by mouth every 6 (six) hours as needed for pain.  100 tablet  0   No current facility-administered medications on file prior to visit.    BP 150/86  Pulse 89  Temp(Src) 98.3 F (36.8 C) (Oral)  Resp 20  Wt 368 lb (166.924 kg)  BMI 49.2 kg/m2  SpO2 98%    Review of Systems  Skin: Positive for rash.       Objective:   Physical Exam  Constitutional: He appears well-developed and well-nourished. No distress.  Skin:  A 1.5 cm circular lesion noted involving his left anterior lower leg with surrounding mild erythema and induration. No fluctuance. This has recently drained. 2 larger patchy areas of hyperpigmentation were noted also involving the left anterior lower leg          Assessment & Plan:   Testosterone  deficiency.   Will check a testosterone level Resolving soft tissue infections- appears to be primarily post inflam hyperpigmentation Obesity-  Dietary referral made

## 2012-08-21 NOTE — Patient Instructions (Signed)
Dietary referral as discussed  You need to lose weight.  Consider a lower calorie diet and regular exercise.    It is important that you exercise regularly, at least 20 minutes 3 to 4 times per week.  If you develop chest pain or shortness of breath seek  medical attention.

## 2012-08-25 ENCOUNTER — Telehealth: Payer: Self-pay | Admitting: Internal Medicine

## 2012-08-25 NOTE — Telephone Encounter (Signed)
See results note. 

## 2012-08-25 NOTE — Telephone Encounter (Signed)
Pt would like results of his testosterone lab. Please call.

## 2012-08-25 NOTE — Telephone Encounter (Signed)
Notify patient that his testosterone level is low and to increase his  Injections by 50% (e.g. 1.5 cc rather than 1 cc)

## 2012-09-03 ENCOUNTER — Telehealth: Payer: Self-pay | Admitting: Internal Medicine

## 2012-09-03 NOTE — Telephone Encounter (Signed)
Error/kh 

## 2012-09-12 DIAGNOSIS — G4730 Sleep apnea, unspecified: Secondary | ICD-10-CM

## 2012-09-12 HISTORY — DX: Sleep apnea, unspecified: G47.30

## 2012-09-15 DIAGNOSIS — G479 Sleep disorder, unspecified: Secondary | ICD-10-CM

## 2012-09-15 DIAGNOSIS — R0609 Other forms of dyspnea: Secondary | ICD-10-CM

## 2012-09-15 DIAGNOSIS — R0989 Other specified symptoms and signs involving the circulatory and respiratory systems: Secondary | ICD-10-CM

## 2012-09-15 DIAGNOSIS — G4710 Hypersomnia, unspecified: Secondary | ICD-10-CM

## 2012-09-17 ENCOUNTER — Telehealth: Payer: Self-pay | Admitting: Internal Medicine

## 2012-09-17 ENCOUNTER — Other Ambulatory Visit: Payer: Self-pay | Admitting: Internal Medicine

## 2012-09-17 DIAGNOSIS — G4730 Sleep apnea, unspecified: Secondary | ICD-10-CM

## 2012-09-17 DIAGNOSIS — G4733 Obstructive sleep apnea (adult) (pediatric): Secondary | ICD-10-CM

## 2012-09-17 NOTE — Telephone Encounter (Signed)
Left message for pt to call me

## 2012-09-17 NOTE — Telephone Encounter (Signed)
Pt called back told him Testosterone level was real low and I spoke to you about this on 7/14 and told you to increase injections 2 cc's every 2 weeks. Pt verbalized understanding and stated yes he did increase injections. Told pt also sleep study showed severe sleep apnea and that is why he is seeing the Pulmonary provider tomorrow. Pt verbalized understanding.

## 2012-09-17 NOTE — Telephone Encounter (Signed)
Please notify patient again that his testosterone level was quite low and he should be taking 2 cc injections.  Please confirm proper dosing

## 2012-09-17 NOTE — Telephone Encounter (Signed)
Pt would like results of labs done 7/10. Ok to leave message.

## 2012-09-17 NOTE — Telephone Encounter (Signed)
Pt would like Testosterone result from 7/10 and pt is scheduled with Pulmonary tomorrow. Please advise

## 2012-09-18 ENCOUNTER — Encounter: Payer: Self-pay | Admitting: Internal Medicine

## 2012-09-18 ENCOUNTER — Encounter: Payer: Self-pay | Admitting: Pulmonary Disease

## 2012-09-18 ENCOUNTER — Ambulatory Visit (INDEPENDENT_AMBULATORY_CARE_PROVIDER_SITE_OTHER): Payer: PRIVATE HEALTH INSURANCE | Admitting: Pulmonary Disease

## 2012-09-18 VITALS — BP 160/110 | HR 85 | Temp 98.9°F | Ht 73.0 in | Wt 374.0 lb

## 2012-09-18 DIAGNOSIS — G4733 Obstructive sleep apnea (adult) (pediatric): Secondary | ICD-10-CM

## 2012-09-18 NOTE — Progress Notes (Signed)
  Subjective:    Patient ID: Justin Norman, male    DOB: [birth year], 42 y.o.   MRN: [id number]  HPI  42 year old morbidly obese smoker presents for evaluation of obstructive sleep apnea.  He reports excessive daytime sleepiness and his fianc Deanna Artis reports loud snoring and witnessed apneas. Epworth sleepiness score is 17/24. He reports sleepiness and very suppression such as sitting and reading, watching TV, sitting inactive in a public place or lying down to rest in the afternoon.  Bedtime is around 11 PM, sleep latency is 10-20 minutes, he sleeps on her side with one pillow but prefers prone position. He has 4-5 awakenings including a bathroom visit and is out of bed at 6 AM feeling tired with dryness of mouth. He is gained about 5 pounds in the last 2 years. He drinks an occasional soda and denies excessive caffeinated beverages. 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 ERECTILE DYSFUNCTION 03/03/2007  . HYPERTENSION NEC 03/03/2007  . HYPERTENSION 07/03/2007  . Obesities, morbid   . Sleep apnea 09/2012    No past surgical history on file.  No Known Allergies  History   Social History  . Marital Status: Divorced    Spouse Name: N/A    Number of Children: 2  . Years of Education: N/A   Occupational History  .  [employer]   Social History Main Topics  . Smoking status: Current Every Day Smoker -- 0.30 packs/day for 15 years    Types: Cigarettes  . Smokeless tobacco: Never Used  . Alcohol Use: Yes     Comment: occasional  . Drug Use: No  . Sexually Active: Not on file   Other Topics Concern  . Not on file   Social History Narrative  . No narrative on file    Family History  Problem Relation Age of Onset  . Hypertension Mother   . Arthritis Mother   . Diabetes Sister    . Diabetes Sister   . Hypertension Sister   . Arrhythmia Sister     has pacemaker     Review of Systems neg for any significant sore throat, dysphagia, itching, sneezing, nasal congestion or excess/ purulent secretions, fever, chills, sweats, unintended wt loss, pleuritic or exertional cp, hempoptysis, orthopnea pnd or change in chronic leg swelling. Also denies presyncope, palpitations, heartburn, abdominal pain, nausea, vomiting, diarrhea or change in bowel or urinary habits, dysuria,hematuria, rash, arthralgias, visual complaints, headache, numbness weakness or ataxia.     Objective:   Physical Exam  Gen. Pleasant, obese, in no distress, normal affect ENT - no lesions, no post nasal drip, class 2-3 airway Neck: No JVD, no thyromegaly, no carotid bruits Lungs: no use of accessory muscles, no dullness to percussion, decreased without rales or rhonchi  Cardiovascular: Rhythm regular, heart sounds  normal, no murmurs or gallops, no peripheral edema Abdomen: soft and non-tender, no hepatosplenomegaly, BS normal. Musculoskeletal: No deformities, no cyanosis or clubbing Neuro:  alert, non focal, no tremors       Assessment & Plan:

## 2012-09-18 NOTE — Patient Instructions (Addendum)
CPAP titration study CPAP will be set up soon after

## 2012-09-18 NOTE — Assessment & Plan Note (Signed)
Severe, home study >> AHI 107/h, nadir desatn 72%   The pathophysiology of obstructive sleep apnea , it's cardiovascular consequences & modes of treatment including CPAP were discused with the patient in detail & they evidenced understanding.  Weight loss encouraged, compliance with goal of at least 4-6 hrs every night is the expectation. Advised against medications with sedative side effects Cautioned against driving when sleepy - understanding that sleepiness will vary on a day to day basis  CPAP titration study will be scheduled and he will be set up with CPAP, will likely need a full face mask given the recent outbreak.

## 2012-09-18 NOTE — Progress Notes (Signed)
  Subjective:    Patient ID: Justin Norman, male    DOB: Mar 20, 1970, 42 y.o.   MRN: [id number]  HPI    Review of Systems  Constitutional: Positive for appetite change and unexpected weight change.  HENT: Negative for ear pain, congestion, sore throat, sneezing, trouble swallowing, dental problem and postnasal drip.   Respiratory: Negative for cough and shortness of breath.   Cardiovascular: Positive for leg swelling.  Musculoskeletal: Positive for joint swelling and arthralgias.  Neurological: Positive for headaches.  Psychiatric/Behavioral: Positive for dysphoric mood. The patient is nervous/anxious.        Objective:   Physical Exam        Assessment & Plan:

## 2012-09-29 ENCOUNTER — Encounter: Payer: Self-pay | Admitting: Dietician

## 2012-09-29 ENCOUNTER — Encounter: Payer: PRIVATE HEALTH INSURANCE | Attending: Internal Medicine | Admitting: Dietician

## 2012-09-29 VITALS — Ht 73.0 in | Wt 374.6 lb

## 2012-09-29 DIAGNOSIS — Z713 Dietary counseling and surveillance: Secondary | ICD-10-CM | POA: Insufficient documentation

## 2012-09-29 DIAGNOSIS — E669 Obesity, unspecified: Secondary | ICD-10-CM | POA: Insufficient documentation

## 2012-09-29 NOTE — Patient Instructions (Addendum)
Eat three meals per day, starting with breakfast with protein. Have snacks with protein and carbohydrates. Consider having a protein shake for breakfast or as a snack. Limit sodium by limiting processed foods. Fill half of lunch and dinner plate with vegetables. Have meals at home at table, take at least 20 minutes to eat meals.  Find time to get exercise in schedule with a goal of 30 minutes per day 5 x week.

## 2012-09-29 NOTE — Progress Notes (Signed)
  Medical Nutrition Therapy:  Appt start time: 1630 end time:  1730.   Assessment:  Primary concerns today: Would like to lose weight. Doctors recommended losing weight for sleep apnea. Has a stressful life in last 6-7 years d/t going through a divorce and dealing with a mother with dementia.  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 States that he shares the grocery shopping with his fiance and share cooking with fiance and kids.   Has been at current weight for awhile, was 360 lbs for awhile. Gained weight through the years and more at stressful times. Lost weight in past by walking on the track 3 miles, used to go to the gym when he was much smaller (in his 20's), and drank whey protein shakes.   Currently only sleeping a few hours a night because of sleep apnea. Will be fitted for CPAP in next few weeks.    MEDICATIONS: see list   DIETARY INTAKE:   Avoided foods include brussels sprouts, turnips, and beets      24-hr recall:  B ( AM): skip  Snk ( 9 AM): small ham biscuit from snack machine at work with water  L ( 1-2PM): bag of chips or sandwich such as a hot dog or chicken salad with water Snk (4 PM): leftover steak or chicken with water, soda, or beer D (6-7 PM): chicken, pork chop, steak, hamburger, spaghetti with salad and mashed potatoes, rice with water, soda, or hawaiian punch Snk ( PM): sometimes late night cookies Beverages: will have 16-18 oz of sweet beverages, mostly water, beer on weekends   Usual physical activity: none  Estimated energy needs: 2200 calories 248 g carbohydrates 165 g protein 61 g fat  Progress Towards Goal(s):  In progress.   Nutritional Diagnosis:  NB-1.1 Food and nutrition-related knowledge deficit As related to meal skipping, unstructured meals, and energy dense food choices.  As evidenced by BMI of 49.4 and hypertension.    Intervention:  Nutrition counseling  provided. Discussed planning meals and snacks ahead of time and bringing food to work instead of using the vending machine. Suggested having prepped fresh vegetables and frozen vegetables available so it is easy to add them to meals. Discussed the importance of eating earlier in the day to prevent overeating later in the day. Discussed appropriate portions sizes of meals including proteins and starches. Recommend switching beverages to no-sugar added drinks or water.    Plan: Eat three meals per day, starting with breakfast with protein. Have snacks with protein and carbohydrates. Consider having a protein shake for breakfast or as a snack. Limit sodium by limiting processed foods. Fill half of lunch and dinner plate with vegetables. Have meals at home at table, take at least 20 minutes to eat meals.  Find time to get exercise in schedule with a goal of 30 minutes per day 5 x week.  Handouts given during visit include:  Hypertension handout  MyPlate  15g CHO snacks  Monitoring/Evaluation:  Dietary intake, exercise, and body weight in 6 week(s).

## 2012-10-07 ENCOUNTER — Ambulatory Visit (HOSPITAL_BASED_OUTPATIENT_CLINIC_OR_DEPARTMENT_OTHER): Payer: PRIVATE HEALTH INSURANCE | Attending: Pulmonary Disease | Admitting: Radiology

## 2012-10-07 VITALS — Ht 73.0 in | Wt 374.0 lb

## 2012-10-07 DIAGNOSIS — G4733 Obstructive sleep apnea (adult) (pediatric): Secondary | ICD-10-CM | POA: Insufficient documentation

## 2012-10-07 DIAGNOSIS — Z6841 Body Mass Index (BMI) 40.0 and over, adult: Secondary | ICD-10-CM | POA: Insufficient documentation

## 2012-10-08 ENCOUNTER — Telehealth: Payer: Self-pay | Admitting: Pulmonary Disease

## 2012-10-08 DIAGNOSIS — G4710 Hypersomnia, unspecified: Secondary | ICD-10-CM

## 2012-10-08 DIAGNOSIS — G4733 Obstructive sleep apnea (adult) (pediatric): Secondary | ICD-10-CM

## 2012-10-08 DIAGNOSIS — G4730 Sleep apnea, unspecified: Secondary | ICD-10-CM

## 2012-10-08 NOTE — Telephone Encounter (Signed)
His severe OSA was corrected by bipap 24/18 - Rx has been sent to [facility] Pl arrange FU in 5 wks with download

## 2012-10-08 NOTE — Procedures (Signed)
NAME:  TINSLEY, LOMAS NO.:  [id number]  MEDICAL RECORD NO.:  [id number]          PATIENT TYPE:  OUT  LOCATION:  SLEEP CENTER                 FACILITY:  [facility]  PHYSICIAN:  Oretha Milch, MD      DATE OF BIRTH:  Jul 21, 1970  DATE OF STUDY:  10/08/2012                           NOCTURNAL POLYSOMNOGRAM  REFERRING PHYSICIAN:  Oretha Milch, MD  INDICATION FOR STUDY:  Mr. Mccabe is a morbidly obese gentleman with loud snoring, excessive daytime somnolence, and witnessed apneas.  A home study showed severe obstructive sleep apnea with an apnea-hypopnea index of 170 events per hour with the lowest desaturation of 72%.  At the time of this titration study, he weighed 374 pounds with a height of 6 feet 1 inch, BMI of 49, neck size of 21 inches.  EPWORTH SLEEPINESS SCORE:  18.  MEDICATIONS:  He took a tablet of Ambien at 9:50 p.m.  This titration study was performed with a sleep technologist in attendance.  EEG, EOG, EMG, EKG, and respiratory parameters were recorded.  Sleep stages, arousals, limb movements, and respiratory data were scored according to criteria laid out by the American academy of Sleep Medicine.  SLEEP ARCHITECTURE:  Lights out was at 10:07 p.m., lights on was at 5:12 a.m.  Total sleep time was 372 minutes with a sleep period time of 403 minutes and sleep efficiency of 88%.  Sleep latency was 21 minutes, latency to REM sleep was 104 minutes and wake after sleep onset was 31 minutes.  Sleep stages of the percentage of total sleep time was N1 11%, N2 62%, N3 0%, and REM sleep 27% (101 minute).  Supine REM sleep accounted for 17 minutes.  Longest period of REM sleep was around 3 a.m.  RESPIRATORY DATA:  CPAP was initiated at 6 cm and titrated to 16 cm, but hypopneas persisted at this level.  Due to high pressure requirement, he was changed to BiPAP at 18 or 14 which was further titrated to a final level of 25/19 cm, at a level of 24/18 cm for 80  minutes of REM sleep. No events or desaturations were noted.  However, due to persistent snoring, he was titrated to 25/19 at this level for 24 minutes of non- REM sleep.  No events or desaturations were noted, but some snoring persisted. This appears to be the optimal level used during the study.  AROUSAL DATA:  The arousal index was 31 events per hour.  Most of these were due to respiratory events.  LIMB MOVEMENT DATA:  No significant limb movements were noted.  OXYGEN SATURATION DATA:  The desaturation index was 46 events per hour. The lowest desaturation was 73%.  He spent 56 minutes with a saturation less than 88%.  A 1 L of oxygen was applied at 1:44 a.m. to maintain saturations above 90%.  However, once the patient was stabilized on BiPAP, oxygen was discontinued and it did not have further desaturations.  CARDIAC DATA:  The low heart rate was 36 beats per minute.  The high heart rate recorded was an artifact.  No arrhythmias were noted.  DISCUSSION:  He was desensitized with a large full-face mask.  BiPAP  was required due to high pressures which he seemed to tolerate well, some snoring persisted at the final level.  Oxygen was applied, but once BiPAP was adequately titrated he did not seem to require oxygen.   IMPRESSIONS: 1. Severe obstructive sleep apnea with hypopneas causing sleep     fragmentation and oxygen desaturation. 2. This was corrected with BiPAP pressure of 24/18 cm with a large     full-face mask. 3. No evidence of cardiac arrhythmias, limb movements, or behavioral     disturbance during sleep.  RECOMMENDATION: 1. The treatment options for this degree of sleep-disordered breathing     include weight loss, and BiPAP therapy. 2. BiPAP can be initiated at 24/18 cm with a large full-face mask.     Alternatively auto-BiPAP can also be tried for better tolerance in     case the high pressure is a problem. 3. Compliance should be monitored at this level.  He  should be asked     to avoid medications with sedative side effects.  He should be     cautioned against driving when sleepy.     Oretha Milch, MD    RVA/MEDQ  D:  10/08/2012 12:29:53  T:  10/08/2012 21:13:58  Job:  [id number]

## 2012-10-09 ENCOUNTER — Telehealth: Payer: Self-pay | Admitting: Pulmonary Disease

## 2012-10-09 NOTE — Telephone Encounter (Signed)
Oretha Milch, MD at 10/08/2012 10:05 PM   Status: Signed            His severe OSA was corrected by bipap 24/18 - Rx has been sent to DME  Pl arrange FU in 5 wks with download   [email or web address] x1

## 2012-10-09 NOTE — Telephone Encounter (Signed)
See phone note 10/09/12

## 2012-10-09 NOTE — Telephone Encounter (Signed)
I spoke with Justin Norman. She stated the tech that worked last night updated the summary worksheet but the only thing that was changed was the mask type. She wanted to make sure he is aware. Dr. Vassie Loll please advise thanks

## 2012-10-10 NOTE — Telephone Encounter (Signed)
Aware. 

## 2012-10-10 NOTE — Telephone Encounter (Signed)
Per staff message RA sent to me: Message    Conveyed results to pt   Will sign off mesage

## 2012-10-15 ENCOUNTER — Telehealth: Payer: Self-pay | Admitting: Pulmonary Disease

## 2012-10-15 NOTE — Telephone Encounter (Signed)
Spoke to pt he is aware he should get a call from [facility] today Tobe Sos

## 2012-10-15 NOTE — Telephone Encounter (Signed)
Spoke to [email or web address]  just got auth yesterday pt will be called today [email or web address] on pt's phone Justin Norman

## 2012-10-17 ENCOUNTER — Telehealth: Payer: Self-pay | Admitting: Pulmonary Disease

## 2012-10-17 ENCOUNTER — Ambulatory Visit (INDEPENDENT_AMBULATORY_CARE_PROVIDER_SITE_OTHER): Payer: PRIVATE HEALTH INSURANCE | Admitting: Internal Medicine

## 2012-10-17 ENCOUNTER — Encounter: Payer: Self-pay | Admitting: Internal Medicine

## 2012-10-17 VITALS — BP 126/70 | HR 94 | Temp 98.5°F | Resp 20 | Wt 376.0 lb

## 2012-10-17 DIAGNOSIS — M549 Dorsalgia, unspecified: Secondary | ICD-10-CM

## 2012-10-17 DIAGNOSIS — E291 Testicular hypofunction: Secondary | ICD-10-CM

## 2012-10-17 DIAGNOSIS — E349 Endocrine disorder, unspecified: Secondary | ICD-10-CM

## 2012-10-17 MED ORDER — CYCLOBENZAPRINE HCL 10 MG PO TABS: 10.0000 mg | ORAL_TABLET | Freq: Three times a day (TID) | ORAL | Status: DC | PRN

## 2012-10-17 MED ORDER — MELOXICAM 15 MG PO TABS: 15.0000 mg | ORAL_TABLET | Freq: Every day | ORAL | Status: DC

## 2012-10-17 MED ORDER — TRAMADOL HCL 50 MG PO TABS: 50.0000 mg | ORAL_TABLET | Freq: Four times a day (QID) | ORAL | Status: DC | PRN

## 2012-10-17 MED ORDER — TESTOSTERONE CYPIONATE 200 MG/ML IM SOLN: INTRAMUSCULAR | Status: DC

## 2012-10-17 NOTE — Progress Notes (Signed)
Subjective:    Patient ID: Justin Norman, male    DOB: [birth year], 42 y.o.   MRN: [id number]  HPI  42 year old patient who has a history of hypertension and morbid obesity. He has OSA. For the past couple weeks he has had back pain. This seems to be maximal in the low thoracic high lumbar area without radiation. Pain is aggravated by movement. He has been using naproxen with some temporary benefit and also tramadol. He has hypertension which has been well-controlled. 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  Past Medical History  Diagnosis Date  . ERECTILE DYSFUNCTION 03/03/2007  . HYPERTENSION NEC 03/03/2007  . HYPERTENSION 07/03/2007  . Obesities, morbid   . Sleep apnea 09/2012    History   Social History  . Marital Status: Divorced    Spouse Name: N/A    Number of Children: 2  . Years of Education: N/A   Occupational History  .  [employer]   Social History Main Topics  . Smoking status: Current Every Day Smoker -- 0.30 packs/day for 15 years    Types: Cigarettes  . Smokeless tobacco: Never Used  . Alcohol Use: Yes     Comment: occasional  . Drug Use: No  . Sexual Activity: Not on file   Other Topics Concern  . Not on file   Social History Narrative  . No narrative on file    History reviewed. No pertinent past surgical history.  Family History  Problem Relation Age of Onset  . Hypertension Mother   . Arthritis Mother   . Diabetes Sister   . Diabetes Sister   . Hypertension Sister   . Arrhythmia Sister     has pacemaker    No Known Allergies  Current Outpatient Prescriptions on File Prior to Visit  Medication Sig Dispense Refill  . lisinopril-hydrochlorothiazide (PRINZIDE,ZESTORETIC) 20-25 MG per tablet Take 1 tablet by mouth daily.  90 tablet  6  . NEEDLE, DISP, 20 G (BD DISP NEEDLES) 20G X 1" MISC Use to draw up Testosterone medication every 14 days.  50 each  1  . NEEDLE, DISP, 23 G 23G X 1" MISC Use to inject  Testosterone every 14 days.  25 each  1  . Syringe, Disposable, (B-D SYRINGE LUER-LOK 3CC) 3 ML MISC Use with Testosterone every 14 days  25 each  1  . testosterone cypionate (DEPOTESTOTERONE CYPIONATE) 200 MG/ML injection 1.5 mL IM every 2 weeks  10 mL  3   No current facility-administered medications on file prior to visit.    BP 126/70  Pulse 94  Temp(Src) 98.5 F (36.9 C) (Oral)  Resp 20  Wt 376 lb (170.552 kg)  BMI 49.62 kg/m2  SpO2 96%       Review of Systems  Constitutional: Negative for fever, chills, appetite change and fatigue.  HENT: Negative for hearing loss, ear pain, congestion, sore throat, trouble swallowing, neck stiffness, dental problem, voice change and tinnitus.   Eyes: Negative for pain, discharge and visual disturbance.  Respiratory: Negative for cough, chest tightness, wheezing and stridor.   Cardiovascular: Negative for chest pain, palpitations and leg swelling.  Gastrointestinal: Negative for nausea, vomiting, abdominal pain, diarrhea, constipation, blood in stool and abdominal distention.  Genitourinary: Negative for urgency, hematuria, flank pain, discharge, difficulty urinating and genital sores.  Musculoskeletal: Positive for back pain. Negative for myalgias, joint swelling, arthralgias and gait problem.  Skin: Negative for rash.  Neurological: Negative for dizziness, syncope, speech difficulty, weakness, numbness and headaches.  Hematological: Negative for adenopathy. Does not bruise/bleed easily.  Psychiatric/Behavioral: Negative for behavioral problems and dysphoric mood. The patient is not nervous/anxious.        Objective:   Physical Exam  Constitutional: He is oriented to person, place, and time. He appears well-developed.  HENT:  Head: Normocephalic.  Right Ear: External ear normal.  Left Ear: External ear normal.  Eyes: Conjunctivae and EOM are normal.  Neck: Normal range of motion.  Cardiovascular: Normal rate and normal heart  sounds.   Pulmonary/Chest: Breath sounds normal.  Abdominal: Bowel sounds are normal.  Musculoskeletal: Normal range of motion. He exhibits no edema and no tenderness.  No local tenderness Negative straight leg test   Neurological: He is alert and oriented to person, place, and time.  Psychiatric: He has a normal mood and affect. His behavior is normal.          Assessment & Plan:   Back pain suspect musculoligamentous. Will add 8 at bedtime dose of Flexeril continue anti-inflammatories and tramadol Weight loss encouraged Morbid obesity Hypertension well controlled

## 2012-10-17 NOTE — Telephone Encounter (Signed)
Asked Okey Regal with [facility] to return my call in reference to pt's order on 10/08/12 for his cpap. Rhonda J Cobb

## 2012-10-17 NOTE — Patient Instructions (Addendum)
Back Pain, Adult  Low back pain is very common. About 1 in 5 people have back pain. The cause of low back pain is rarely dangerous. The pain often gets better over time. About half of people with a sudden onset of back pain feel better in just 2 weeks. About 8 in 10 people feel better by 6 weeks.   CAUSES  Some common causes of back pain include:  · Strain of the muscles or ligaments supporting the spine.  · Wear and tear (degeneration) of the spinal discs.  · Arthritis.  · Direct injury to the back.  DIAGNOSIS  Most of the time, the direct cause of low back pain is not known. However, back pain can be treated effectively even when the exact cause of the pain is unknown. Answering your caregiver's questions about your overall health and symptoms is one of the most accurate ways to make sure the cause of your pain is not dangerous. If your caregiver needs more information, he or she may order lab work or imaging tests (X-rays or MRIs). However, even if imaging tests show changes in your back, this usually does not require surgery.  HOME CARE INSTRUCTIONS  For many people, back pain returns. Since low back pain is rarely dangerous, it is often a condition that people can learn to manage on their own.   · Remain active. It is stressful on the back to sit or stand in one place. Do not sit, drive, or stand in one place for more than 30 minutes at a time. Take short walks on level surfaces as soon as pain allows. Try to increase the length of time you walk each day.  · Do not stay in bed. Resting more than 1 or 2 days can delay your recovery.  · Do not avoid exercise or work. Your body is made to move. It is not dangerous to be active, even though your back may hurt. Your back will likely heal faster if you return to being active before your pain is gone.  · Pay attention to your body when you  bend and lift. Many people have less discomfort when lifting if they bend their knees, keep the load close to their bodies, and  avoid twisting. Often, the most comfortable positions are those that put less stress on your recovering back.  · Find a comfortable position to sleep. Use a firm mattress and lie on your side with your knees slightly bent. If you lie on your back, put a pillow under your knees.  · Only take over-the-counter or prescription medicines as directed by your caregiver. Over-the-counter medicines to reduce pain and inflammation are often the most helpful. Your caregiver may prescribe muscle relaxant drugs. These medicines help dull your pain so you can more quickly return to your normal activities and healthy exercise.  · Put ice on the injured area.  · Put ice in a plastic bag.  · Place a towel between your skin and the bag.  · Leave the ice on for 15-20 minutes, 3-4 times a day for the first 2 to 3 days. After that, ice and heat may be alternated to reduce pain and spasms.  · Ask your caregiver about trying back exercises and gentle massage. This may be of some benefit.  · Avoid feeling anxious or stressed. Stress increases muscle tension and can worsen back pain. It is important to recognize when you are anxious or stressed and learn ways to manage it. Exercise is a great option.  SEEK MEDICAL CARE IF:  · You have pain that is not relieved with rest or   medicine.  · You have pain that does not improve in 1 week.  · You have new symptoms.  · You are generally not feeling well.  SEEK IMMEDIATE MEDICAL CARE IF:   · You have pain that radiates from your back into your legs.  · You develop new bowel or bladder control problems.  · You have unusual weakness or numbness in your arms or legs.  · You develop nausea or vomiting.  · You develop abdominal pain.  · You feel faint.  Document Released: 01/29/2005 Document Revised: 07/31/2011 Document Reviewed: 06/19/2010  ExitCare® Patient Information ©2014 [employer].

## 2012-10-18 LAB — TESTOSTERONE: Testosterone: 1861.82 ng/dL — ABNORMAL HIGH (ref 300–890)

## 2012-10-20 ENCOUNTER — Telehealth: Payer: Self-pay | Admitting: Internal Medicine

## 2012-10-20 NOTE — Telephone Encounter (Signed)
Pt aware see lab result note.

## 2012-10-20 NOTE — Telephone Encounter (Signed)
Pt calling to inquire about his lab results from Friday 10/17/12. Please assist.

## 2012-10-20 NOTE — Telephone Encounter (Signed)
Called [facility] and spoke with Melchor Amour who stated that [facility] was waiting on additional information from the insurance. She spoke with the processing department and they have everything they need to arrange set up for patient. She stated that [facility] would be contacting patient today to arrange set up. Left message on the below phone number for Ms. [place] to return my call. Rhonda J Cobb

## 2012-10-20 NOTE — Telephone Encounter (Signed)
Spoke with [place] at [place] who stated that according to their records at [place], pt is to be contacted today to arrange cpap set up. Advised [facility] to contact the home number to arrange. Spoke with Ms. [place] and advised her of the above. Asked her to contact us back if they do not hear from [place]. Rhonda J Cobb

## 2012-10-21 ENCOUNTER — Telehealth: Payer: Self-pay | Admitting: Pulmonary Disease

## 2012-10-21 NOTE — Telephone Encounter (Signed)
I spoke with [place] from [place]. She stated they called and left pt a message stating he is scheduled for 10/22/12 at 11 for set up. They will call pt again. I advised spouse of this and nothing further needed

## 2012-10-23 ENCOUNTER — Telehealth: Payer: Self-pay | Admitting: Internal Medicine

## 2012-10-23 MED ORDER — HYDROCODONE-ACETAMINOPHEN 5-325 MG PO TABS: 1.0000 | ORAL_TABLET | Freq: Four times a day (QID) | ORAL | Status: DC | PRN

## 2012-10-23 NOTE — Telephone Encounter (Signed)
Decrease testosterone from 2 cc every 2 weeks to 1.5 cc every 2 weeks  Please call in a prescription for generic Vicodin #50  one every 6 hours as needed for pain

## 2012-10-23 NOTE — Telephone Encounter (Signed)
Pt is still having back pain. Pt was seen on 10-17-12. Pt stated meloxicam,tramadol and flexeril no relief. Pt having back spasm in middle of back. [facility]. Please advise. Pt want clarification on dosage on testosterone inj and  bloodwork result

## 2012-10-23 NOTE — Telephone Encounter (Signed)
Spoke to pt told him Rx called into pharmacy for Vicodin pain medicine one tablet every 6 hours as needed. Also reveiwed Testosterone result again and told him to decrease testosterone to 1.5 cc every 2 weeks. Pt verbalized understanding. Rx called into pharmacy.

## 2012-10-31 ENCOUNTER — Encounter: Payer: Self-pay | Admitting: Internal Medicine

## 2012-10-31 ENCOUNTER — Ambulatory Visit (INDEPENDENT_AMBULATORY_CARE_PROVIDER_SITE_OTHER): Payer: PRIVATE HEALTH INSURANCE | Admitting: Internal Medicine

## 2012-10-31 VITALS — BP 160/96 | HR 98 | Temp 97.8°F | Resp 20 | Wt 374.0 lb

## 2012-10-31 DIAGNOSIS — M549 Dorsalgia, unspecified: Secondary | ICD-10-CM

## 2012-10-31 DIAGNOSIS — E291 Testicular hypofunction: Secondary | ICD-10-CM

## 2012-10-31 DIAGNOSIS — I10 Essential (primary) hypertension: Secondary | ICD-10-CM

## 2012-10-31 DIAGNOSIS — E349 Endocrine disorder, unspecified: Secondary | ICD-10-CM

## 2012-10-31 LAB — TESTOSTERONE: Testosterone: 229.13 ng/dL — ABNORMAL LOW (ref 350.00–890.00)

## 2012-10-31 MED ORDER — HYDROCODONE-ACETAMINOPHEN 5-325 MG PO TABS: 1.0000 | ORAL_TABLET | Freq: Four times a day (QID) | ORAL | Status: DC | PRN

## 2012-10-31 MED ORDER — TESTOSTERONE CYPIONATE 200 MG/ML IM SOLN: INTRAMUSCULAR | Status: DC

## 2012-10-31 NOTE — Progress Notes (Signed)
Subjective:    Patient ID: Justin Norman, male    DOB: [birth year], 42 y.o.   MRN: [id number]  HPI  42 year old patient who has a history of morbid obesity and hypertension. He was seen here 2 is still with back pain. This has the persisted in spite of anti-inflammatories pain medications and most relaxers. Pain is more marked on the left and is in the low thoracic upper lumbar area. Pain is worsened with activities throughout the day  Past Medical History  Diagnosis Date  . ERECTILE DYSFUNCTION 03/03/2007  . HYPERTENSION NEC 03/03/2007  . HYPERTENSION 07/03/2007  . Obesities, morbid   . Sleep apnea 09/2012    History   Social History  . Marital Status: Divorced    Spouse Name: N/A    Number of Children: 2  . Years of Education: N/A   Occupational History  .  [employer]   Social History Main Topics  . Smoking status: Current Every Day Smoker -- 0.30 packs/day for 15 years    Types: Cigarettes  . Smokeless tobacco: Never Used  . Alcohol Use: Yes     Comment: occasional  . Drug Use: No  . Sexual Activity: Not on file   Other Topics Concern  . Not on file   Social History Narrative  . No narrative on file    History reviewed. No pertinent past surgical history.  Family History  Problem Relation Age of Onset  . Hypertension Mother   . Arthritis Mother   . Diabetes Sister   . Diabetes Sister   . Hypertension Sister   . Arrhythmia Sister     has pacemaker    No Known Allergies  Current Outpatient Prescriptions on File Prior to Visit  Medication Sig Dispense Refill  . HYDROcodone-acetaminophen (NORCO/VICODIN) 5-325 MG per tablet Take 1 tablet by mouth every 6 (six) hours as needed for pain.  50 tablet  0  . lisinopril-hydrochlorothiazide (PRINZIDE,ZESTORETIC) 20-25 MG per tablet Take 1 tablet by mouth daily.  90 tablet  6  . meloxicam (MOBIC) 15 MG tablet Take 1 tablet (15 mg total) by mouth daily.  30 tablet  2  . NEEDLE, DISP, 20 G (BD DISP NEEDLES) 20G X  1" MISC Use to draw up Testosterone medication every 14 days.  50 each  1  . NEEDLE, DISP, 23 G 23G X 1" MISC Use to inject Testosterone every 14 days.  25 each  1  . Syringe, Disposable, (B-D SYRINGE LUER-LOK 3CC) 3 ML MISC Use with Testosterone every 14 days  25 each  1  . testosterone cypionate (DEPOTESTOTERONE CYPIONATE) 200 MG/ML injection 1.5 mL IM every 2 weeks  10 mL  3  . cyclobenzaprine (FLEXERIL) 10 MG tablet Take 1 tablet (10 mg total) by mouth 3 (three) times daily as needed for muscle spasms.  30 tablet  0  . traMADol (ULTRAM) 50 MG tablet Take 1 tablet (50 mg total) by mouth every 6 (six) hours as needed for pain.  100 tablet  0   No current facility-administered medications on file prior to visit.    BP 160/96  Pulse 98  Temp(Src) 97.8 F (36.6 C) (Oral)  Resp 20  Wt 374 lb (169.645 kg)  BMI 49.35 kg/m2  SpO2 98%       Review of Systems  Constitutional: Positive for diaphoresis. Negative for fever, chills, appetite change and fatigue.  HENT: Negative for hearing loss, ear pain, congestion, sore throat, trouble swallowing, neck stiffness, dental problem, voice change  and tinnitus.   Eyes: Negative for pain, discharge and visual disturbance.  Respiratory: Negative for cough, chest tightness, wheezing and stridor.   Cardiovascular: Negative for chest pain, palpitations and leg swelling.  Gastrointestinal: Negative for nausea, vomiting, abdominal pain, diarrhea, constipation, blood in stool and abdominal distention.  Genitourinary: Negative for urgency, hematuria, flank pain, discharge, difficulty urinating and genital sores.  Musculoskeletal: Positive for back pain. Negative for myalgias, joint swelling, arthralgias and gait problem.  Skin: Negative for rash.  Neurological: Negative for dizziness, syncope, speech difficulty, weakness, numbness and headaches.  Hematological: Negative for adenopathy. Does not bruise/bleed easily.  Psychiatric/Behavioral: Negative for  behavioral problems and dysphoric mood. The patient is not nervous/anxious.        Objective:   Physical Exam  Constitutional: He appears well-developed and well-nourished. No distress.  Uncomfortable but in no acute distress. Repeat blood pressure improved at 140/90  Musculoskeletal:  Tight tense musculature noted involving the left paravertebral region of the lower thoracic upper lumbar area          Assessment & Plan:  Back pain. Appears to be muscular. Gentle stretching massage therapy heat all discussed. Continue with anti-inflammatories and analgesics as needed. Weight loss encouraged Hypertension

## 2012-10-31 NOTE — Patient Instructions (Signed)
Back Injury Prevention Back injuries can be extremely painful and difficult to heal. After having one back injury, you are much more likely to experience another later on. It is important to learn how to avoid injuring or re-injuring your back. The following tips can help you to prevent a back injury. PHYSICAL FITNESS  Exercise regularly and try to develop good tone in your abdominal muscles. Your abdominal muscles provide a lot of the support needed by your back.  Do aerobic exercises (walking, jogging, biking, swimming) regularly.  Do exercises that increase balance and strength (tai chi, yoga) regularly. This can decrease your risk of falling and injuring your back.  Stretch before and after exercising.  Maintain a healthy weight. The more you weigh, the more stress is placed on your back. For every pound of weight, 10 times that amount of pressure is placed on the back. DIET  Talk to your caregiver about how much calcium and vitamin D you need per day. These nutrients help to prevent weakening of the bones (osteoporosis). Osteoporosis can cause broken (fractured) bones that lead to back pain.  Include good sources of calcium in your diet, such as dairy products, green, leafy vegetables, and products with calcium added (fortified).  Include good sources of vitamin D in your diet, such as milk and foods that are fortified with vitamin D.  Consider taking a nutritional supplement or a multivitamin if needed.  Stop smoking if you smoke. POSTURE  Sit and stand up straight. Avoid leaning forward when you sit or hunching over when you stand.  Choose chairs with good low back (lumbar) support.  If you work at a desk, sit close to your work so you do not need to lean over. Keep your chin tucked in. Keep your neck drawn back and elbows bent at a right angle. Your arms should look like the letter "L."  Sit high and close to the steering wheel when you drive. Add a lumbar support to your car  seat if needed.  Avoid sitting or standing in one position for too long. Take breaks to get up, stretch, and walk around at least once every hour. Take breaks if you are driving for long periods of time.  Sleep on your side with your knees slightly bent, or sleep on your back with a pillow under your knees. Do not sleep on your stomach. LIFTING, TWISTING, AND REACHING  Avoid heavy lifting, especially repetitive lifting. If you must do heavy lifting:  Stretch before lifting.  Work slowly.  Rest between lifts.  Use carts and dollies to move objects when possible.  Make several small trips instead of carrying 1 heavy load.  Ask for help when you need it.  Ask for help when moving big, awkward objects.  Follow these steps when lifting:  Stand with your feet shoulder-width apart.  Get as close to the object as you can. Do not try to pick up heavy objects that are far from your body.  Use handles or lifting straps if they are available.  Bend at your knees. Squat down, but keep your heels off the floor.  Keep your shoulders pulled back, your chin tucked in, and your back straight.  Lift the object slowly, tightening the muscles in your legs, abdomen, and buttocks. Keep the object as close to the center of your body as possible.  When you put a load down, use these same guidelines in reverse.  Do not:  Lift the object above your waist.    Twist at the waist while lifting or carrying a load. Move your feet if you need to turn, not your waist.  Bend over without bending at your knees.  Avoid reaching over your head, across a table, or for an object on a high surface. OTHER TIPS  Avoid wet floors and keep sidewalks clear of ice to prevent falls.  Do not sleep on a mattress that is too soft or too hard.  Keep items that are used frequently within easy reach.  Put heavier objects on shelves at waist level and lighter objects on lower or higher shelves.  Find ways to  decrease your stress, such as exercise, massage, or relaxation techniques. Stress can build up in your muscles. Tense muscles are more vulnerable to injury.  Seek treatment for depression or anxiety if needed. These conditions can increase your risk of developing back pain. SEEK MEDICAL CARE IF:  You injure your back.  You have questions about diet, exercise, or other ways to prevent back injuries. MAKE SURE YOU:  Understand these instructions.  Will watch your condition.  Will get help right away if you are not doing well or get worse. Document Released: 03/08/2004 Document Revised: 04/23/2011 Document Reviewed: 03/12/2011 [facility] Patient Information 2014 [place], [place]. Back Pain, Adult Low back pain is very common. About 1 in 5 people have back pain.The cause of low back pain is rarely dangerous. The pain often gets better over time.About half of people with a sudden onset of back pain feel better in just 2 weeks. About 8 in 10 people feel better by 6 weeks.  CAUSES Some common causes of back pain include:  Strain of the muscles or ligaments supporting the spine.  Wear and tear (degeneration) of the spinal discs.  Arthritis.  Direct injury to the back. DIAGNOSIS Most of the time, the direct cause of low back pain is not known.However, back pain can be treated effectively even when the exact cause of the pain is unknown.Answering your caregiver's questions about your overall health and symptoms is one of the most accurate ways to make sure the cause of your pain is not dangerous. If your caregiver needs more information, he or she may order lab work or imaging tests (X-rays or MRIs).However, even if imaging tests show changes in your back, this usually does not require surgery. HOME CARE INSTRUCTIONS For many people, back pain returns.Since low back pain is rarely dangerous, it is often a condition that people can learn to [facility] their own.   Remain active. It is  stressful on the back to sit or stand in one place. Do not sit, drive, or stand in one place for more than 30 minutes at a time. Take short walks on level surfaces as soon as pain allows.Try to increase the length of time you walk each day.  Do not stay in bed.Resting more than 1 or 2 days can delay your recovery.  Do not avoid exercise or work.Your body is made to move.It is not dangerous to be active, even though your back may hurt.Your back will likely heal faster if you return to being active before your pain is gone.  Pay attention to your body when you bend and lift. Many people have less discomfortwhen lifting if they bend their knees, keep the load close to their bodies,and avoid twisting. Often, the most comfortable positions are those that put less stress on your recovering back.  Find a comfortable position to sleep. Use a firm mattress and lie  on your side with your knees slightly bent. If you lie on your back, put a pillow under your knees.  Only take over-the-counter or prescription medicines as directed by your caregiver. Over-the-counter medicines to reduce pain and inflammation are often the most helpful.Your caregiver may prescribe muscle relaxant drugs.These medicines help dull your pain so you can more quickly return to your normal activities and healthy exercise.  Put ice on the injured area.  Put ice in a plastic bag.  Place a towel between your skin and the bag.  Leave the ice on for 15-20 minutes, 3-4 times a day for the first 2 to 3 days. After that, ice and heat may be alternated to reduce pain and spasms.  Ask your caregiver about trying back exercises and gentle massage. This may be of some benefit.  Avoid feeling anxious or stressed.Stress increases muscle tension and can worsen back pain.It is important to recognize when you are anxious or stressed and learn ways to manage it.Exercise is a great option. SEEK MEDICAL CARE IF:  You have pain that is  not relieved with rest or medicine.  You have pain that does not improve in 1 week.  You have new symptoms.  You are generally not feeling well. SEEK IMMEDIATE MEDICAL CARE IF:   You have pain that radiates from your back into your legs.  You develop new bowel or bladder control problems.  You have unusual weakness or numbness in your arms or legs.  You develop nausea or vomiting.  You develop abdominal pain.  You feel faint. Document Released: 01/29/2005 Document Revised: 07/31/2011 Document Reviewed: 06/19/2010 [facility] Patient Information 2014 [place], [place].

## 2012-11-03 ENCOUNTER — Telehealth: Payer: Self-pay | Admitting: Internal Medicine

## 2012-11-03 NOTE — Telephone Encounter (Signed)
Notify patient that his testosterone is in a low therapeutic range and to continue his present dosing of injectable testosterone

## 2012-11-03 NOTE — Telephone Encounter (Signed)
Pt would like results of labs done last week. °

## 2012-11-04 ENCOUNTER — Other Ambulatory Visit: Payer: Self-pay | Admitting: Family Medicine

## 2012-11-04 NOTE — Telephone Encounter (Signed)
Left message on voicemail to call office.  

## 2012-11-04 NOTE — Telephone Encounter (Signed)
Patient notified to continue with the 2 cc.

## 2012-11-04 NOTE — Telephone Encounter (Signed)
There are 2 directions in the computer.  1.5cc and 2cc.  Pt states he has been taking 2cc.  Just making sure this is correct.

## 2012-11-04 NOTE — Telephone Encounter (Signed)
Pt called and stated that you can call him back and he will try to answer. He stated that, that IS his voicemail and that it has no prompt, it just beeps and you may begin speaking. Please advise.

## 2012-11-04 NOTE — Telephone Encounter (Signed)
Yes continue the 2 cc  dose and if this is what he has presently been taking

## 2012-11-04 NOTE — Telephone Encounter (Signed)
Pt returned call pls call. Pt states if you do not get him, ok to leave on VM...[phone]

## 2012-11-04 NOTE — Telephone Encounter (Signed)
Tried to call back got voicemail but did not identify it was pt. Left message to call office.

## 2012-11-10 ENCOUNTER — Ambulatory Visit: Payer: PRIVATE HEALTH INSURANCE | Admitting: Dietician

## 2012-12-18 ENCOUNTER — Other Ambulatory Visit: Payer: Self-pay

## 2013-02-16 ENCOUNTER — Encounter: Payer: Self-pay | Admitting: Internal Medicine

## 2013-02-16 ENCOUNTER — Ambulatory Visit (INDEPENDENT_AMBULATORY_CARE_PROVIDER_SITE_OTHER): Payer: 59 | Admitting: Internal Medicine

## 2013-02-16 VITALS — BP 170/120 | HR 86 | Temp 98.2°F | Resp 20 | Wt 382.0 lb

## 2013-02-16 DIAGNOSIS — E66813 Obesity, class 3: Secondary | ICD-10-CM

## 2013-02-16 DIAGNOSIS — IMO0002 Reserved for concepts with insufficient information to code with codable children: Secondary | ICD-10-CM

## 2013-02-16 MED ORDER — CYCLOBENZAPRINE HCL 10 MG PO TABS: 10.0000 mg | ORAL_TABLET | Freq: Three times a day (TID) | ORAL | Status: DC | PRN

## 2013-02-16 MED ORDER — HYDROCODONE-ACETAMINOPHEN 5-325 MG PO TABS: 1.0000 | ORAL_TABLET | Freq: Four times a day (QID) | ORAL | Status: DC | PRN

## 2013-02-16 MED ORDER — LISINOPRIL-HYDROCHLOROTHIAZIDE 20-25 MG PO TABS: 1.0000 | ORAL_TABLET | Freq: Every day | ORAL | Status: DC

## 2013-02-16 NOTE — Patient Instructions (Signed)
Limit your sodium (Salt) intake  Please check your blood pressure on a regular basis.  If it is consistently greater than 150/90, please make an office appointment.  Most patients with low back pain will improve with time over the next two to 6 weeks.  Keep active but avoid any activities that cause pain.  Apply moist heat to the low back area several times daily.  Return in one month for follow-up

## 2013-02-16 NOTE — Progress Notes (Signed)
Subjective:    Patient ID: Justin Norman, male    DOB: Jul 01, 1970, 43 y.o.   MRN: [id number]  HPI  43 year old patient who has a history of hypertension morbid obesity and intermittent back pain. The past several days he has had worsening back spasm involving his left high lumbar area. This was precipitated by lifting a heavy object overhead and twisting at the waist. He has treated hypertension but has not been taking his medications faithfully. He also has a history of testosterone deficiency. He has self discontinued by monthly testosterone injections. He felt this was causing weight gain and was of no benefit. He still has nice benefit with the ED medications  BP Readings from Last 3 Encounters:  02/16/13 170/120  10/31/12 160/96  10/17/12 126/70    Wt Readings from Last 3 Encounters:  02/16/13 382 lb (173.274 kg)  10/31/12 374 lb (169.645 kg)  10/17/12 376 lb (170.552 kg)   Past Medical History  Diagnosis Date  . ERECTILE DYSFUNCTION 03/03/2007  . HYPERTENSION NEC 03/03/2007  . HYPERTENSION 07/03/2007  . Obesities, morbid   . Sleep apnea 09/2012    History   Social History  . Marital Status: Divorced    Spouse Name: N/A    Number of Children: 2  . Years of Education: N/A   Occupational History  .  [employer]   Social History Main Topics  . Smoking status: Current Every Day Smoker -- 0.30 packs/day for 15 years    Types: Cigarettes  . Smokeless tobacco: Never Used  . Alcohol Use: Yes     Comment: occasional  . Drug Use: No  . Sexual Activity: Not on file   Other Topics Concern  . Not on file   Social History Narrative  . No narrative on file    History reviewed. No pertinent past surgical history.  Family History  Problem Relation Age of Onset  . Hypertension Mother   . Arthritis Mother   . Diabetes Sister   . Diabetes Sister   . Hypertension Sister   . Arrhythmia Sister     has pacemaker    No Known Allergies  Current Outpatient  Prescriptions on File Prior to Visit  Medication Sig Dispense Refill  . NEEDLE, DISP, 20 G (BD DISP NEEDLES) 20G X 1" MISC Use to draw up Testosterone medication every 14 days.  50 each  1  . NEEDLE, DISP, 23 G 23G X 1" MISC Use to inject Testosterone every 14 days.  25 each  1  . Syringe, Disposable, (B-D SYRINGE LUER-LOK 3CC) 3 ML MISC Use with Testosterone every 14 days  25 each  1  . testosterone cypionate (DEPOTESTOTERONE CYPIONATE) 200 MG/ML injection Inject 200 mg into the muscle every 14 (fourteen) days.       No current facility-administered medications on file prior to visit.    BP 170/120  Pulse 86  Temp(Src) 98.2 F (36.8 C) (Oral)  Resp 20  Wt 382 lb (173.274 kg)  SpO2 97%    Review of Systems  Constitutional: Negative for fever, chills, appetite change and fatigue.  HENT: Negative for congestion, dental problem, ear pain, hearing loss, sore throat, tinnitus, trouble swallowing and voice change.   Eyes: Negative for pain, discharge and visual disturbance.  Respiratory: Negative for cough, chest tightness, wheezing and stridor.   Cardiovascular: Negative for chest pain, palpitations and leg swelling.  Gastrointestinal: Negative for nausea, vomiting, abdominal pain, diarrhea, constipation, blood in stool and abdominal distention.  Genitourinary: Negative  for urgency, hematuria, flank pain, discharge, difficulty urinating and genital sores.  Musculoskeletal: Positive for back pain. Negative for arthralgias, gait problem, joint swelling, myalgias and neck stiffness.  Skin: Negative for rash.  Neurological: Negative for dizziness, syncope, speech difficulty, weakness, numbness and headaches.  Hematological: Negative for adenopathy. Does not bruise/bleed easily.  Psychiatric/Behavioral: Negative for behavioral problems and dysphoric mood. The patient is not nervous/anxious.        Objective:   Physical Exam  Constitutional: He is oriented to person, place, and time. He  appears well-developed.  Blood pressure 170/108  HENT:  Head: Normocephalic.  Right Ear: External ear normal.  Left Ear: External ear normal.  Eyes: Conjunctivae and EOM are normal.  Neck: Normal range of motion.  Cardiovascular: Normal rate and normal heart sounds.   Pulmonary/Chest: Breath sounds normal.  Abdominal: Bowel sounds are normal.  Musculoskeletal: Normal range of motion. He exhibits no edema and no tenderness.  Tenderness in the left upper lumbar area with twisting at the waist to the right  Neurological: He is alert and oriented to person, place, and time.  Psychiatric: He has a normal mood and affect. His behavior is normal.          Assessment & Plan:   Recurrent back pain. Appears to be more musculoligamentous. We'll continue anti-inflammatories analgesics and muscle relaxers. Will rest and apply warm compresses. Will cough improved Hypertension poor control. Compliance issues addressed. Home blood pressure monitor and encouraged. We'll reassess in 4 weeks Morbid obesity. Weight loss encouraged

## 2013-02-16 NOTE — Progress Notes (Signed)
Pre-visit discussion using our clinic review tool. No additional management support is needed unless otherwise documented below in the visit note.  

## 2013-03-20 ENCOUNTER — Ambulatory Visit (INDEPENDENT_AMBULATORY_CARE_PROVIDER_SITE_OTHER): Payer: 59 | Admitting: Internal Medicine

## 2013-03-20 ENCOUNTER — Encounter: Payer: Self-pay | Admitting: Internal Medicine

## 2013-03-20 VITALS — BP 156/90 | HR 84 | Temp 98.3°F | Resp 20 | Ht 73.0 in | Wt 378.7 lb

## 2013-03-20 DIAGNOSIS — I10 Essential (primary) hypertension: Secondary | ICD-10-CM

## 2013-03-20 DIAGNOSIS — E349 Endocrine disorder, unspecified: Secondary | ICD-10-CM

## 2013-03-20 DIAGNOSIS — E291 Testicular hypofunction: Secondary | ICD-10-CM

## 2013-03-20 MED ORDER — TESTOSTERONE CYPIONATE 200 MG/ML IM SOLN: INTRAMUSCULAR | Status: DC

## 2013-03-20 MED ORDER — HYDROCODONE-ACETAMINOPHEN 5-325 MG PO TABS: 1.0000 | ORAL_TABLET | Freq: Four times a day (QID) | ORAL | Status: DC | PRN

## 2013-03-20 NOTE — Progress Notes (Signed)
Pre-visit discussion using our clinic review tool. No additional management support is needed unless otherwise documented below in the visit note.  

## 2013-03-20 NOTE — Progress Notes (Signed)
Subjective:    Patient ID: Justin Norman, male    DOB: 02/01/1971, 43 y.o.   MRN: [id number]  HPI  43 year old patient who is seen today for followup of hypertension. He was seen last month off medication and blood pressure was moderately elevated. For the past month he has been compliant with the combination therapy and generally feels well. His left lumbar back pain has improved but still present. He has a history of testosterone deficiency but has not taken his bimonthly injection in over 4 weeks. He has morbid obesity which is modestly improved at  Past Medical History  Diagnosis Date  . ERECTILE DYSFUNCTION 03/03/2007  . HYPERTENSION NEC 03/03/2007  . HYPERTENSION 07/03/2007  . Obesities, morbid   . Sleep apnea 09/2012    History   Social History  . Marital Status: Divorced    Spouse Name: N/A    Number of Children: 2  . Years of Education: N/A   Occupational History  .  [employer]   Social History Main Topics  . Smoking status: Current Every Day Smoker -- 0.30 packs/day for 15 years    Types: Cigarettes  . Smokeless tobacco: Never Used  . Alcohol Use: Yes     Comment: occasional  . Drug Use: No  . Sexual Activity: Not on file   Other Topics Concern  . Not on file   Social History Narrative  . No narrative on file    History reviewed. No pertinent past surgical history.  Family History  Problem Relation Age of Onset  . Hypertension Mother   . Arthritis Mother   . Diabetes Sister   . Diabetes Sister   . Hypertension Sister   . Arrhythmia Sister     has pacemaker    No Known Allergies  Current Outpatient Prescriptions on File Prior to Visit  Medication Sig Dispense Refill  . cyclobenzaprine (FLEXERIL) 10 MG tablet Take 1 tablet (10 mg total) by mouth 3 (three) times daily as needed for muscle spasms.  60 tablet  4  . lisinopril-hydrochlorothiazide (PRINZIDE,ZESTORETIC) 20-25 MG per tablet Take 1 tablet by mouth daily.  90 tablet  6  . NEEDLE,  DISP, 20 G (BD DISP NEEDLES) 20G X 1" MISC Use to draw up Testosterone medication every 14 days.  50 each  1  . NEEDLE, DISP, 23 G 23G X 1" MISC Use to inject Testosterone every 14 days.  25 each  1  . Syringe, Disposable, (B-D SYRINGE LUER-LOK 3CC) 3 ML MISC Use with Testosterone every 14 days  25 each  1   No current facility-administered medications on file prior to visit.    BP 156/90  Pulse 84  Temp(Src) 98.3 F (36.8 C) (Oral)  Resp 20  Ht 6\' 1"  (1.854 m)  Wt 378 lb 11.2 oz (171.777 kg)  BMI 49.97 kg/m2  SpO2 97%       Review of Systems  Constitutional: Negative for fever, chills, appetite change and fatigue.  HENT: Negative for congestion, dental problem, ear pain, hearing loss, sore throat, tinnitus, trouble swallowing and voice change.   Eyes: Negative for pain, discharge and visual disturbance.  Respiratory: Negative for cough, chest tightness, wheezing and stridor.   Cardiovascular: Negative for chest pain, palpitations and leg swelling.  Gastrointestinal: Negative for nausea, vomiting, abdominal pain, diarrhea, constipation, blood in stool and abdominal distention.  Genitourinary: Negative for urgency, hematuria, flank pain, discharge, difficulty urinating and genital sores.  Musculoskeletal: Positive for back pain. Negative for arthralgias, gait  problem, joint swelling, myalgias and neck stiffness.  Skin: Negative for rash.  Neurological: Negative for dizziness, syncope, speech difficulty, weakness, numbness and headaches.  Hematological: Negative for adenopathy. Does not bruise/bleed easily.  Psychiatric/Behavioral: Negative for behavioral problems and dysphoric mood. The patient is not nervous/anxious.        Objective:   Physical Exam  Constitutional: He appears well-developed and well-nourished. No distress.  Blood pressure as low as 140/86          Assessment & Plan:   Hypertension improved. Low salt diet weight loss exercise regimen all anchors.  We'll continue present regimen. Home blood pressure monitor encouraged Obesity Back pain improved. Analgesic refilled  Recheck 4 months

## 2013-03-20 NOTE — Patient Instructions (Signed)
Limit your sodium (Salt) intake  Please check your blood pressure on a regular basis.  If it is consistently greater than 150/90, please make an office appointment.  You need to lose weight.  Consider a lower calorie diet and regular exercise.

## 2013-03-23 ENCOUNTER — Telehealth: Payer: Self-pay | Admitting: Internal Medicine

## 2013-03-23 NOTE — Telephone Encounter (Signed)
Relevant patient education assigned to patient using Emmi. ° °

## 2013-04-13 ENCOUNTER — Telehealth: Payer: Self-pay | Admitting: Internal Medicine

## 2013-04-13 NOTE — Telephone Encounter (Signed)
Patient Information:  Caller Name: Robey  Phone: [phone]  Patient: Justin Norman, Justin Norman  Gender: Male  DOB: [birth year]  Age: 43 Years  PCP: Bluford Kaufmann (Family Practice > 9yrs old)  Office Follow Up:  Does the office need to follow up with this patient?: No  Instructions For The Office: N/A  RN Note:  Pt agreesto an appt for today  Symptoms  Reason For Call & Symptoms: Pt calling and states that he was dx with a boil to the rectum in the past;  MD instructed that it will come back; pt can feel when it is swelling and when he has a BM it will bleed every now and then; sx has been going on for the last 8 months to a year; sx are increasing in frequency now;  sx started to increase in frequency since early last week; approx 04/06/13;  no bleeding at present time;  Reviewed Health History In EMR: Yes  Reviewed Medications In EMR: Yes  Reviewed Allergies In EMR: Yes  Reviewed Surgeries / Procedures: Yes  Date of Onset of Symptoms: Unknown  Guideline(s) Used:  Rectal Bleeding  Disposition Per Guideline:   See Today in Office  Reason For Disposition Reached:   All other patients with rectal bleeding (Exceptions: blood just on toilet paper, few drops, streaks on surface of normal formed BM)  Advice Given:  Call Back If:  Bleeding increases in amount  You become worse.  Patient Will Follow Care Advice:  YES  Appointment Scheduled:  04/14/2013 09:15:00 Appointment Scheduled Provider:  Roxy [facility]) Offered appt for today but office was full; instructed that he can go to UC/ED  since no available appt but pt stated that he prefers appt for tomorrow

## 2013-04-13 NOTE — Telephone Encounter (Signed)
Noted  

## 2013-04-14 ENCOUNTER — Telehealth: Payer: Self-pay | Admitting: Internal Medicine

## 2013-04-14 ENCOUNTER — Encounter: Payer: Self-pay | Admitting: Family

## 2013-04-14 ENCOUNTER — Ambulatory Visit (INDEPENDENT_AMBULATORY_CARE_PROVIDER_SITE_OTHER): Payer: 59 | Admitting: Family

## 2013-04-14 VITALS — BP 160/90 | HR 94 | Wt 360.0 lb

## 2013-04-14 DIAGNOSIS — K612 Anorectal abscess: Secondary | ICD-10-CM

## 2013-04-14 DIAGNOSIS — K219 Gastro-esophageal reflux disease without esophagitis: Secondary | ICD-10-CM

## 2013-04-14 DIAGNOSIS — K611 Rectal abscess: Secondary | ICD-10-CM

## 2013-04-14 MED ORDER — OMEPRAZOLE 40 MG PO CPDR: 40.0000 mg | DELAYED_RELEASE_CAPSULE | Freq: Every day | ORAL | Status: DC

## 2013-04-14 NOTE — Progress Notes (Signed)
Subjective:    Patient ID: Justin Norman, male    DOB: September 10, 1970, 43 y.o.   MRN: [id number]  HPI  43 year old African American male, nonsmoker, with a history of obesity, hypertension, erectile dysfunction is in today with complaints of a recurrent abscess to the perianal area. Reports have an abscess lanced by Dr. Raliegh Ip and felt like it may have returned. He has an appointment with the surgeon on 04/27/2013. He denies any pain. Reports the bowel movement is able to feel the abscess. No drainage or discharge  Patient has concerns of bloating, nausea and gagging x3 days. Reports increased belching. Has been taking Gas-X that helps.  Review of Systems  Constitutional: Negative.   HENT: Negative.   Respiratory: Negative.   Cardiovascular: Negative.   Gastrointestinal: Positive for nausea. Negative for abdominal pain and abdominal distention.  Musculoskeletal: Negative.   Skin:       Reports abscess to rectum  Neurological: Negative.   Psychiatric/Behavioral: Negative.    Past Medical History  Diagnosis Date  . ERECTILE DYSFUNCTION 03/03/2007  . HYPERTENSION NEC 03/03/2007  . HYPERTENSION 07/03/2007  . Obesities, morbid   . Sleep apnea 09/2012    History   Social History  . Marital Status: Divorced    Spouse Name: N/A    Number of Children: 2  . Years of Education: N/A   Occupational History  .  [employer]   Social History Main Topics  . Smoking status: Current Every Day Smoker -- 0.30 packs/day for 15 years    Types: Cigarettes  . Smokeless tobacco: Never Used  . Alcohol Use: Yes     Comment: occasional  . Drug Use: No  . Sexual Activity: Not on file   Other Topics Concern  . Not on file   Social History Narrative  . No narrative on file    History reviewed. No pertinent past surgical history.  Family History  Problem Relation Age of Onset  . Hypertension Mother   . Arthritis Mother   . Diabetes Sister   . Diabetes Sister   . Hypertension Sister   .  Arrhythmia Sister     has pacemaker    No Known Allergies  Current Outpatient Prescriptions on File Prior to Visit  Medication Sig Dispense Refill  . cyclobenzaprine (FLEXERIL) 10 MG tablet Take 1 tablet (10 mg total) by mouth 3 (three) times daily as needed for muscle spasms.  60 tablet  4  . HYDROcodone-acetaminophen (NORCO/VICODIN) 5-325 MG per tablet Take 1 tablet by mouth every 6 (six) hours as needed.  60 tablet  0  . lisinopril-hydrochlorothiazide (PRINZIDE,ZESTORETIC) 20-25 MG per tablet Take 1 tablet by mouth daily.  90 tablet  6  . NEEDLE, DISP, 20 G (BD DISP NEEDLES) 20G X 1" MISC Use to draw up Testosterone medication every 14 days.  50 each  1  . NEEDLE, DISP, 23 G 23G X 1" MISC Use to inject Testosterone every 14 days.  25 each  1  . Syringe, Disposable, (B-D SYRINGE LUER-LOK 3CC) 3 ML MISC Use with Testosterone every 14 days  25 each  1  . testosterone cypionate (DEPOTESTOTERONE CYPIONATE) 200 MG/ML injection Inject one and 1/2 cc every 2 weeks  10 mL  4   No current facility-administered medications on file prior to visit.    BP 160/90  Pulse 94  Wt 360 lb (163.295 kg)chart    Objective:   Physical Exam  Constitutional: He is oriented to person, place, and time. He  appears well-developed and well-nourished.  Cardiovascular: Normal rate, regular rhythm and normal heart sounds.   Pulmonary/Chest: Effort normal and breath sounds normal.  Abdominal: Soft. Bowel sounds are normal.  Neurological: He is alert and oriented to person, place, and time.  Skin: Skin is warm and dry.  No visible abscess  Psychiatric: He has a normal mood and affect.          Assessment & Plan:  [name] was seen today for recurrent skin infections.  Diagnoses and associated orders for this visit:  Rectal abscess - Ambulatory referral to General Surgery  GERD (gastroesophageal reflux disease)  Other Orders - omeprazole (PRILOSEC) 40 MG capsule; Take 1 capsule (40 mg total) by mouth  daily.    call the office with any questions or concerns. rechheck as schedule, and as needed.

## 2013-04-14 NOTE — Telephone Encounter (Signed)
Relevant patient education assigned to patient using Emmi. ° °

## 2013-04-14 NOTE — Progress Notes (Signed)
Pre visit review using our clinic review tool, if applicable. No additional management support is needed unless otherwise documented below in the visit note. 

## 2013-04-14 NOTE — Patient Instructions (Signed)

## 2013-04-27 ENCOUNTER — Encounter (INDEPENDENT_AMBULATORY_CARE_PROVIDER_SITE_OTHER): Payer: BC Managed Care – PPO | Admitting: Surgery

## 2013-05-07 ENCOUNTER — Telehealth (INDEPENDENT_AMBULATORY_CARE_PROVIDER_SITE_OTHER): Payer: Self-pay | Admitting: General Surgery

## 2013-05-07 NOTE — Telephone Encounter (Signed)
Remote pt of Dr. Johney [place] called about another peri-rectal abscess.  He felt pain and within 24 hours, the abscess ruptured.  It drained pus initially, then bloody drainage.  He has been doing tub soaks since.  Pt instructed to wash thoroughly with antibacterial soap in the shower and rinse well.  Made appt (Est Pt), but it is three weeks out, so will likely heal over by then.  Pt will call to cancel if this occurs.

## 2013-05-28 ENCOUNTER — Encounter (INDEPENDENT_AMBULATORY_CARE_PROVIDER_SITE_OTHER): Payer: 59 | Admitting: Surgery

## 2013-06-12 ENCOUNTER — Ambulatory Visit (INDEPENDENT_AMBULATORY_CARE_PROVIDER_SITE_OTHER): Payer: PRIVATE HEALTH INSURANCE | Admitting: Internal Medicine

## 2013-06-12 ENCOUNTER — Encounter: Payer: Self-pay | Admitting: Internal Medicine

## 2013-06-12 VITALS — BP 140/90 | HR 82 | Temp 98.3°F | Resp 20 | Ht 73.0 in | Wt 381.6 lb

## 2013-06-12 DIAGNOSIS — I10 Essential (primary) hypertension: Secondary | ICD-10-CM

## 2013-06-12 MED ORDER — MELOXICAM 15 MG PO TABS: 15.0000 mg | ORAL_TABLET | Freq: Every day | ORAL | Status: DC

## 2013-06-12 MED ORDER — HYDROCODONE-ACETAMINOPHEN 5-325 MG PO TABS: 1.0000 | ORAL_TABLET | Freq: Four times a day (QID) | ORAL | Status: DC | PRN

## 2013-06-12 NOTE — Progress Notes (Signed)
Subjective:    Patient ID: Justin Norman, male    DOB: [birth year], 43 y.o.   MRN: [id number]  HPI 43 year old patient who has a history of hypertension, and obesity.  He presents today with a chief complaint of right knee pain.  His job requires work in a Forensic scientist.  Throughout the day, he leads with his right foot to climb into the vehicle.  For the past several days.  He has developed worsening right knee pain, but no effusion.  No other local trauma. Weight is now 381, up approximately 21 pounds  Past Medical History  Diagnosis Date  . ERECTILE DYSFUNCTION 03/03/2007  . HYPERTENSION NEC 03/03/2007  . HYPERTENSION 07/03/2007  . Obesities, morbid   . Sleep apnea 09/2012    History   Social History  . Marital Status: Divorced    Spouse Name: N/A    Number of Children: 2  . Years of Education: N/A   Occupational History  .  [employer]   Social History Main Topics  . Smoking status: Current Every Day Smoker -- 0.30 packs/day for 15 years    Types: Cigarettes  . Smokeless tobacco: Never Used  . Alcohol Use: Yes     Comment: occasional  . Drug Use: No  . Sexual Activity: Not on file   Other Topics Concern  . Not on file   Social History Narrative  . No narrative on file    History reviewed. No pertinent past surgical history.  Family History  Problem Relation Age of Onset  . Hypertension Mother   . Arthritis Mother   . Diabetes Sister   . Diabetes Sister   . Hypertension Sister   . Arrhythmia Sister     has pacemaker    No Known Allergies  Current Outpatient Prescriptions on File Prior to Visit  Medication Sig Dispense Refill  . cyclobenzaprine (FLEXERIL) 10 MG tablet Take 1 tablet (10 mg total) by mouth 3 (three) times daily as needed for muscle spasms.  60 tablet  4  . lisinopril-hydrochlorothiazide (PRINZIDE,ZESTORETIC) 20-25 MG per tablet Take 1 tablet by mouth daily.  90 tablet  6  . NEEDLE, DISP, 20 G (BD DISP NEEDLES) 20G X 1" MISC Use to draw up  Testosterone medication every 14 days.  50 each  1  . NEEDLE, DISP, 23 G 23G X 1" MISC Use to inject Testosterone every 14 days.  25 each  1  . omeprazole (PRILOSEC) 40 MG capsule Take 1 capsule (40 mg total) by mouth daily.  30 capsule  3  . Syringe, Disposable, (B-D SYRINGE LUER-LOK 3CC) 3 ML MISC Use with Testosterone every 14 days  25 each  1  . HYDROcodone-acetaminophen (NORCO/VICODIN) 5-325 MG per tablet Take 1 tablet by mouth every 6 (six) hours as needed.  60 tablet  0  . testosterone cypionate (DEPOTESTOTERONE CYPIONATE) 200 MG/ML injection Inject one and 1/2 cc every 2 weeks  10 mL  4   No current facility-administered medications on file prior to visit.    BP 140/90  Pulse 82  Temp(Src) 98.3 F (36.8 C) (Oral)  Resp 20  Ht 6\' 1"  (1.854 m)  Wt 381 lb 9.6 oz (173.093 kg)  BMI 50.36 kg/m2  SpO2 98%      Review of Systems  Constitutional: Negative for fever, chills, appetite change and fatigue.  HENT: Negative for congestion, dental problem, ear pain, hearing loss, sore throat, tinnitus, trouble swallowing and voice change.   Eyes: Negative for pain, discharge  and visual disturbance.  Respiratory: Negative for cough, chest tightness, wheezing and stridor.   Cardiovascular: Negative for chest pain, palpitations and leg swelling.  Gastrointestinal: Negative for nausea, vomiting, abdominal pain, diarrhea, constipation, blood in stool and abdominal distention.  Genitourinary: Negative for urgency, hematuria, flank pain, discharge, difficulty urinating and genital sores.  Musculoskeletal: Positive for arthralgias. Negative for back pain, gait problem, joint swelling, myalgias and neck stiffness.       Right knee pain  Skin: Negative for rash.  Neurological: Negative for dizziness, syncope, speech difficulty, weakness, numbness and headaches.  Hematological: Negative for adenopathy. Does not bruise/bleed easily.  Psychiatric/Behavioral: Negative for behavioral problems and  dysphoric mood. The patient is not nervous/anxious.        Objective:   Physical Exam  Constitutional: He appears well-developed and well-nourished. No distress.  Weight 381  Musculoskeletal:  Mild tenderness of the patellar ligament and especially over the medial joint line.  No effusion or signs or active inflammation          Assessment & Plan:   Right knee pain.  Overuse injury.  We'll place on meloxicam 15.  Weight loss encouraged Hypertension

## 2013-06-12 NOTE — Progress Notes (Signed)
   Subjective:    Patient ID: Justin Norman, male    DOB: 26-Jul-1970, 43 y.o.   MRN: [id number]  HPI  BP Readings from Last 3 Encounters:  06/12/13 140/90  04/14/13 160/90  03/20/13 156/90    Wt Readings from Last 3 Encounters:  06/12/13 381 lb 9.6 oz (173.093 kg)  04/14/13 360 lb (163.295 kg)  03/20/13 378 lb 11.2 oz (171.777 kg)    Review of Systems     Objective:   Physical Exam        Assessment & Plan:

## 2013-06-12 NOTE — Progress Notes (Signed)
Pre-visit discussion using our clinic review tool. No additional management support is needed unless otherwise documented below in the visit note.  

## 2013-06-12 NOTE — Patient Instructions (Signed)
You  may move around, but avoid painful motions and activities.  Apply ice to the sore area for 15 to 20 minutes 3 or 4 times daily for the next two to 3 days.  Please check your blood pressure on a regular basis.  If it is consistently greater than 150/90, please make an office appointment.  You need to lose weight.  Consider a lower calorie diet and regular exercise.  Limit your sodium (Salt) intake

## 2013-06-22 ENCOUNTER — Encounter: Payer: Self-pay | Admitting: Internal Medicine

## 2013-06-22 ENCOUNTER — Ambulatory Visit (INDEPENDENT_AMBULATORY_CARE_PROVIDER_SITE_OTHER): Payer: PRIVATE HEALTH INSURANCE | Admitting: Internal Medicine

## 2013-06-22 VITALS — BP 170/100 | HR 88 | Temp 98.7°F | Resp 20 | Ht 73.0 in | Wt 383.0 lb

## 2013-06-22 DIAGNOSIS — I10 Essential (primary) hypertension: Secondary | ICD-10-CM

## 2013-06-22 DIAGNOSIS — M25569 Pain in unspecified knee: Secondary | ICD-10-CM

## 2013-06-22 DIAGNOSIS — M25561 Pain in right knee: Secondary | ICD-10-CM

## 2013-06-22 NOTE — Progress Notes (Signed)
   Subjective:    Patient ID: Justin Norman, male    DOB: 1970/04/08, 43 y.o.   MRN: [id number]  HPI  BP Readings from Last 3 Encounters:  06/22/13 170/100  06/12/13 140/90  04/14/13 160/90    Review of Systems     Objective:   Physical Exam        Assessment & Plan:

## 2013-06-22 NOTE — Progress Notes (Signed)
Pre-visit discussion using our clinic review tool. No additional management support is needed unless otherwise documented below in the visit note.  

## 2013-06-22 NOTE — Progress Notes (Signed)
Subjective:    Patient ID: Justin Norman, male    DOB: June 02, 1970, 43 y.o.   MRN: [id number]  HPI  43 year old patient who has hypertension and obesity.  He was seen 10 days ago with right knee pain that has not improved with analgesics and anti-inflammatory medications.  He continues to work a Forensic scientist.  Past Medical History  Diagnosis Date  . ERECTILE DYSFUNCTION 03/03/2007  . HYPERTENSION NEC 03/03/2007  . HYPERTENSION 07/03/2007  . Obesities, morbid   . Sleep apnea 09/2012    History   Social History  . Marital Status: Divorced    Spouse Name: N/A    Number of Children: 2  . Years of Education: N/A   Occupational History  .  [employer]   Social History Main Topics  . Smoking status: Current Every Day Smoker -- 0.30 packs/day for 15 years    Types: Cigarettes  . Smokeless tobacco: Never Used  . Alcohol Use: Yes     Comment: occasional  . Drug Use: No  . Sexual Activity: Not on file   Other Topics Concern  . Not on file   Social History Narrative  . No narrative on file    History reviewed. No pertinent past surgical history.  Family History  Problem Relation Age of Onset  . Hypertension Mother   . Arthritis Mother   . Diabetes Sister   . Diabetes Sister   . Hypertension Sister   . Arrhythmia Sister     has pacemaker    No Known Allergies  Current Outpatient Prescriptions on File Prior to Visit  Medication Sig Dispense Refill  . HYDROcodone-acetaminophen (NORCO/VICODIN) 5-325 MG per tablet Take 1 tablet by mouth every 6 (six) hours as needed.  60 tablet  0  . lisinopril-hydrochlorothiazide (PRINZIDE,ZESTORETIC) 20-25 MG per tablet Take 1 tablet by mouth daily.  90 tablet  6  . meloxicam (MOBIC) 15 MG tablet Take 1 tablet (15 mg total) by mouth daily.  60 tablet  2  . NEEDLE, DISP, 20 G (BD DISP NEEDLES) 20G X 1" MISC Use to draw up Testosterone medication every 14 days.  50 each  1  . NEEDLE, DISP, 23 G 23G X 1" MISC Use to inject Testosterone  every 14 days.  25 each  1  . omeprazole (PRILOSEC) 40 MG capsule Take 1 capsule (40 mg total) by mouth daily.  30 capsule  3  . Syringe, Disposable, (B-D SYRINGE LUER-LOK 3CC) 3 ML MISC Use with Testosterone every 14 days  25 each  1  . testosterone cypionate (DEPOTESTOTERONE CYPIONATE) 200 MG/ML injection Inject one and 1/2 cc every 2 weeks  10 mL  4   No current facility-administered medications on file prior to visit.    BP 170/100  Pulse 88  Temp(Src) 98.7 F (37.1 C) (Oral)  Resp 20  Ht 6\' 1"  (1.854 m)  Wt 383 lb (173.728 kg)  BMI 50.54 kg/m2  SpO2 95%       Review of Systems  Musculoskeletal: Positive for arthralgias and gait problem.       Objective:   Physical Exam  Constitutional: He appears well-developed and well-nourished. No distress.  Weight 383 Blood pressure 160/95 (no meds today)   Musculoskeletal:  Right knee examined.  Tenderness along the medial joint line. Suggestion of some mild puffiness medially Today, no tenderness over the patellar ligament          Assessment & Plan:   Right medial knee pain.  Rule out  meniscal tear.  We'll refer to orthopedics and get an excuse from work  Obesity Hypertension.  Blood pressure elevated today off medication.  Blood pressure was high normal 10 days ago.  Compliance stressed weight loss encouraged

## 2013-06-22 NOTE — Patient Instructions (Signed)
Limit your sodium (Salt) intake  You need to lose weight.  Consider a lower calorie diet and regular exercise.  Please check your blood pressure on a regular basis.  If it is consistently greater than 150/90, please make an office appointment.   Orthopedic followup as discussed

## 2013-10-20 ENCOUNTER — Other Ambulatory Visit: Payer: Self-pay | Admitting: Orthopedic Surgery

## 2013-10-20 DIAGNOSIS — M25561 Pain in right knee: Secondary | ICD-10-CM

## 2013-10-31 ENCOUNTER — Ambulatory Visit
Admission: RE | Admit: 2013-10-31 | Discharge: 2013-10-31 | Disposition: A | Discharge status: Home / Self Care | Payer: PRIVATE HEALTH INSURANCE | Source: Ambulatory Visit | Attending: Orthopedic Surgery | Admitting: Orthopedic Surgery

## 2013-10-31 DIAGNOSIS — M25561 Pain in right knee: Secondary | ICD-10-CM

## 2013-11-17 ENCOUNTER — Encounter (HOSPITAL_BASED_OUTPATIENT_CLINIC_OR_DEPARTMENT_OTHER): Payer: Self-pay | Admitting: *Deleted

## 2013-11-17 NOTE — Progress Notes (Signed)
Called office-talked with dr singer-pt severe osa had to use bipap-does not have a machine-needs to r/s to main or-stay overnight

## 2013-11-18 ENCOUNTER — Encounter (HOSPITAL_COMMUNITY): Payer: Self-pay

## 2013-11-18 ENCOUNTER — Encounter (HOSPITAL_COMMUNITY): Payer: Self-pay | Admitting: *Deleted

## 2013-11-18 NOTE — Progress Notes (Signed)
Pt's PCP is Dr. Cordelia Pen. Has sleep apnea, but has not been able to afford the c-pap. Pt states it seems to have gotten better since he is not as stressed as he was.

## 2013-11-19 ENCOUNTER — Other Ambulatory Visit: Payer: Self-pay | Admitting: Orthopedic Surgery

## 2013-11-20 ENCOUNTER — Encounter (HOSPITAL_COMMUNITY): Payer: Self-pay | Admitting: Anesthesiology

## 2013-11-20 ENCOUNTER — Observation Stay (HOSPITAL_COMMUNITY)
Admission: RE | Admit: 2013-11-20 | Discharge: 2013-11-21 | Disposition: A | Discharge status: Home / Self Care | Payer: PRIVATE HEALTH INSURANCE | Source: Ambulatory Visit | Attending: Orthopedic Surgery | Admitting: Orthopedic Surgery

## 2013-11-20 ENCOUNTER — Ambulatory Visit (HOSPITAL_COMMUNITY): Payer: PRIVATE HEALTH INSURANCE

## 2013-11-20 ENCOUNTER — Encounter (HOSPITAL_COMMUNITY)
Admission: RE | Disposition: A | Discharge status: Home / Self Care | Payer: Self-pay | Source: Ambulatory Visit | Attending: Orthopedic Surgery

## 2013-11-20 ENCOUNTER — Encounter (HOSPITAL_COMMUNITY): Payer: PRIVATE HEALTH INSURANCE | Admitting: Certified Registered"

## 2013-11-20 ENCOUNTER — Ambulatory Visit (HOSPITAL_COMMUNITY): Payer: PRIVATE HEALTH INSURANCE | Admitting: Certified Registered"

## 2013-11-20 ENCOUNTER — Encounter (HOSPITAL_BASED_OUTPATIENT_CLINIC_OR_DEPARTMENT_OTHER): Admission: RE | Payer: Self-pay | Source: Ambulatory Visit

## 2013-11-20 ENCOUNTER — Ambulatory Visit (HOSPITAL_BASED_OUTPATIENT_CLINIC_OR_DEPARTMENT_OTHER)
Admission: RE | Admit: 2013-11-20 | Payer: PRIVATE HEALTH INSURANCE | Source: Ambulatory Visit | Admitting: Orthopedic Surgery

## 2013-11-20 DIAGNOSIS — S83241A Other tear of medial meniscus, current injury, right knee, initial encounter: Principal | ICD-10-CM | POA: Diagnosis present

## 2013-11-20 DIAGNOSIS — X58XXXA Exposure to other specified factors, initial encounter: Secondary | ICD-10-CM | POA: Diagnosis not present

## 2013-11-20 DIAGNOSIS — M2341 Loose body in knee, right knee: Secondary | ICD-10-CM | POA: Insufficient documentation

## 2013-11-20 DIAGNOSIS — K219 Gastro-esophageal reflux disease without esophagitis: Secondary | ICD-10-CM | POA: Diagnosis not present

## 2013-11-20 DIAGNOSIS — Z6841 Body Mass Index (BMI) 40.0 and over, adult: Secondary | ICD-10-CM | POA: Diagnosis not present

## 2013-11-20 DIAGNOSIS — G4733 Obstructive sleep apnea (adult) (pediatric): Secondary | ICD-10-CM | POA: Diagnosis present

## 2013-11-20 DIAGNOSIS — I10 Essential (primary) hypertension: Secondary | ICD-10-CM | POA: Diagnosis not present

## 2013-11-20 DIAGNOSIS — N529 Male erectile dysfunction, unspecified: Secondary | ICD-10-CM | POA: Insufficient documentation

## 2013-11-20 DIAGNOSIS — F17210 Nicotine dependence, cigarettes, uncomplicated: Secondary | ICD-10-CM | POA: Diagnosis not present

## 2013-11-20 DIAGNOSIS — S83249A Other tear of medial meniscus, current injury, unspecified knee, initial encounter: Secondary | ICD-10-CM | POA: Diagnosis present

## 2013-11-20 HISTORY — DX: Headache, unspecified: R51.9

## 2013-11-20 HISTORY — DX: Anxiety disorder, unspecified: F41.9

## 2013-11-20 HISTORY — DX: Xerosis cutis: L85.3

## 2013-11-20 HISTORY — DX: Headache: R51

## 2013-11-20 HISTORY — DX: Other tear of medial meniscus, current injury, right knee, initial encounter: S83.241A

## 2013-11-20 HISTORY — DX: Gastro-esophageal reflux disease without esophagitis: K21.9

## 2013-11-20 HISTORY — PX: KNEE ARTHROSCOPY: SHX127

## 2013-11-20 HISTORY — DX: Calculus of kidney: N20.0

## 2013-11-20 LAB — BASIC METABOLIC PANEL
ANION GAP: 13 (ref 5–15)
BUN: 17 mg/dL (ref 6–23)
CHLORIDE: 102 meq/L (ref 96–112)
CO2: 24 mEq/L (ref 19–32)
Calcium: 9 mg/dL (ref 8.4–10.5)
Creatinine, Ser: 1.05 mg/dL (ref 0.50–1.35)
GFR calc Af Amer: >90 mL/min (ref >90)
GFR calc non Af Amer: 85 mL/min — ABNORMAL LOW (ref >90)
Glucose, Bld: 102 mg/dL — ABNORMAL HIGH (ref 70–99)
Potassium: 4.1 mEq/L (ref 3.7–5.3)
Sodium: 139 mEq/L (ref 137–147)

## 2013-11-20 LAB — CBC
HCT: 47.1 % (ref 39.0–52.0)
HEMOGLOBIN: 16.4 g/dL (ref 13.0–17.0)
MCH: 31 pg (ref 26.0–34.0)
MCHC: 34.8 g/dL (ref 30.0–36.0)
MCV: 89 fL (ref 78.0–100.0)
Platelets: 180 10*3/uL (ref 150–400)
RBC: 5.29 MIL/uL (ref 4.22–5.81)
RDW: 13.8 % (ref 11.5–15.5)
WBC: 6.1 10*3/uL (ref 4.0–10.5)

## 2013-11-20 LAB — GLUCOSE, CAPILLARY: GLUCOSE-CAPILLARY: 107 mg/dL — AB (ref 70–99)

## 2013-11-20 SURGERY — ARTHROSCOPY, KNEE, WITH MEDIAL MENISCECTOMY
Anesthesia: Choice | Site: Knee | Laterality: Right

## 2013-11-20 SURGERY — ARTHROSCOPY, KNEE
Anesthesia: General | Site: Knee | Laterality: Right

## 2013-11-20 MED ORDER — HYDROMORPHONE HCL 1 MG/ML IJ SOLN
INTRAMUSCULAR | Status: AC
  Administered 2013-11-20: 0.5 mg via INTRAVENOUS
  Filled 2013-11-20: qty 1

## 2013-11-20 MED ORDER — ONDANSETRON HCL 4 MG PO TABS: 4.0000 mg | ORAL_TABLET | Freq: Three times a day (TID) | ORAL | Status: DC | PRN

## 2013-11-20 MED ORDER — METOCLOPRAMIDE HCL 5 MG/ML IJ SOLN: 5.0000 mg | Freq: Three times a day (TID) | INTRAMUSCULAR | Status: DC | PRN

## 2013-11-20 MED ORDER — FENTANYL CITRATE 0.05 MG/ML IJ SOLN
INTRAMUSCULAR | Status: DC | PRN
  Administered 2013-11-20: 50 ug via INTRAVENOUS
  Administered 2013-11-20: 100 ug via INTRAVENOUS
  Administered 2013-11-20 (×2): 50 ug via INTRAVENOUS

## 2013-11-20 MED ORDER — PROPOFOL 10 MG/ML IV BOLUS
INTRAVENOUS | Status: AC
  Filled 2013-11-20: qty 20

## 2013-11-20 MED ORDER — SUCCINYLCHOLINE CHLORIDE 20 MG/ML IJ SOLN
INTRAMUSCULAR | Status: DC | PRN
  Administered 2013-11-20: 160 mg via INTRAVENOUS

## 2013-11-20 MED ORDER — PROPOFOL 10 MG/ML IV BOLUS
INTRAVENOUS | Status: DC | PRN
  Administered 2013-11-20: 300 mg via INTRAVENOUS

## 2013-11-20 MED ORDER — BISACODYL 10 MG RE SUPP
10.0000 mg | Freq: Every day | RECTAL | Status: DC | PRN
Start: 2013-11-20 — End: 2013-11-21

## 2013-11-20 MED ORDER — SENNA-DOCUSATE SODIUM 8.6-50 MG PO TABS: 2.0000 | ORAL_TABLET | Freq: Every day | ORAL | Status: DC

## 2013-11-20 MED ORDER — MIDAZOLAM HCL 2 MG/2ML IJ SOLN
INTRAMUSCULAR | Status: AC
  Filled 2013-11-20: qty 2

## 2013-11-20 MED ORDER — HYDROMORPHONE HCL 1 MG/ML IJ SOLN
0.2500 mg | INTRAMUSCULAR | Status: DC | PRN
  Administered 2013-11-20 (×2): 0.5 mg via INTRAVENOUS

## 2013-11-20 MED ORDER — ONDANSETRON HCL 4 MG PO TABS
4.0000 mg | ORAL_TABLET | Freq: Four times a day (QID) | ORAL | Status: DC | PRN
Start: 2013-11-20 — End: 2013-11-21

## 2013-11-20 MED ORDER — SODIUM CHLORIDE 0.9 % IR SOLN
Status: DC | PRN
  Administered 2013-11-20 (×2): 3000 mL

## 2013-11-20 MED ORDER — OXYCODONE HCL 5 MG PO TABS
5.0000 mg | ORAL_TABLET | Freq: Once | ORAL | Status: AC | PRN
  Administered 2013-11-20: 5 mg via ORAL

## 2013-11-20 MED ORDER — OXYCODONE HCL 5 MG PO TABS
ORAL_TABLET | ORAL | Status: AC
  Administered 2013-11-20: 5 mg via ORAL
  Filled 2013-11-20: qty 1

## 2013-11-20 MED ORDER — POTASSIUM CHLORIDE IN NACL 20-0.45 MEQ/L-% IV SOLN
INTRAVENOUS | Status: DC
  Administered 2013-11-20: 21:00:00 via INTRAVENOUS
  Filled 2013-11-20 (×3): qty 1000

## 2013-11-20 MED ORDER — HYDROCHLOROTHIAZIDE 25 MG PO TABS
25.0000 mg | ORAL_TABLET | Freq: Every day | ORAL | Status: DC
  Administered 2013-11-20 – 2013-11-21 (×2): 25 mg via ORAL
  Filled 2013-11-20 (×2): qty 1

## 2013-11-20 MED ORDER — ONDANSETRON HCL 4 MG/2ML IJ SOLN: 4.0000 mg | Freq: Four times a day (QID) | INTRAMUSCULAR | Status: DC | PRN

## 2013-11-20 MED ORDER — DIPHENHYDRAMINE HCL 12.5 MG/5ML PO ELIX
12.5000 mg | ORAL_SOLUTION | ORAL | Status: DC | PRN
Start: 2013-11-20 — End: 2013-11-21

## 2013-11-20 MED ORDER — LACTATED RINGERS IV SOLN: INTRAVENOUS | Status: DC

## 2013-11-20 MED ORDER — PROMETHAZINE HCL 25 MG/ML IJ SOLN: 6.2500 mg | INTRAMUSCULAR | Status: DC | PRN

## 2013-11-20 MED ORDER — DOCUSATE SODIUM 100 MG PO CAPS
100.0000 mg | ORAL_CAPSULE | Freq: Two times a day (BID) | ORAL | Status: DC
  Administered 2013-11-20 – 2013-11-21 (×3): 100 mg via ORAL
  Filled 2013-11-20 (×4): qty 1

## 2013-11-20 MED ORDER — OXYCODONE HCL 5 MG PO TABS
5.0000 mg | ORAL_TABLET | ORAL | Status: DC | PRN
  Administered 2013-11-20 – 2013-11-21 (×2): 10 mg via ORAL
  Filled 2013-11-20 (×2): qty 2

## 2013-11-20 MED ORDER — OXYCODONE HCL 5 MG/5ML PO SOLN: 5.0000 mg | Freq: Once | ORAL | Status: AC | PRN

## 2013-11-20 MED ORDER — STERILE WATER FOR IRRIGATION IR SOLN
Status: DC | PRN
  Administered 2013-11-20: 1000 mL

## 2013-11-20 MED ORDER — LACTATED RINGERS IV SOLN
INTRAVENOUS | Status: DC | PRN
  Administered 2013-11-20 (×2): via INTRAVENOUS

## 2013-11-20 MED ORDER — METHOCARBAMOL 500 MG PO TABS
ORAL_TABLET | ORAL | Status: AC
  Administered 2013-11-20: 500 mg via ORAL
  Filled 2013-11-20: qty 1

## 2013-11-20 MED ORDER — HYDROMORPHONE HCL 1 MG/ML IJ SOLN
0.5000 mg | INTRAMUSCULAR | Status: DC | PRN
  Administered 2013-11-20 – 2013-11-21 (×2): 1 mg via INTRAVENOUS
  Filled 2013-11-20 (×2): qty 1

## 2013-11-20 MED ORDER — CEFAZOLIN SODIUM-DEXTROSE 2-3 GM-% IV SOLR
2.0000 g | Freq: Four times a day (QID) | INTRAVENOUS | Status: AC
  Administered 2013-11-20 – 2013-11-21 (×3): 2 g via INTRAVENOUS
  Filled 2013-11-20 (×3): qty 50

## 2013-11-20 MED ORDER — POLYETHYLENE GLYCOL 3350 17 G PO PACK: 17.0000 g | PACK | Freq: Every day | ORAL | Status: DC | PRN

## 2013-11-20 MED ORDER — BUPIVACAINE-EPINEPHRINE (PF) 0.25% -1:200000 IJ SOLN
INTRAMUSCULAR | Status: AC
  Filled 2013-11-20: qty 30

## 2013-11-20 MED ORDER — LISINOPRIL 20 MG PO TABS
20.0000 mg | ORAL_TABLET | Freq: Once | ORAL | Status: AC
  Administered 2013-11-20: 20 mg via ORAL
  Filled 2013-11-20: qty 1

## 2013-11-20 MED ORDER — OXYCODONE-ACETAMINOPHEN 10-325 MG PO TABS: 1.0000 | ORAL_TABLET | Freq: Four times a day (QID) | ORAL | Status: DC | PRN

## 2013-11-20 MED ORDER — MIDAZOLAM HCL 5 MG/5ML IJ SOLN
INTRAMUSCULAR | Status: DC | PRN
  Administered 2013-11-20: 2 mg via INTRAVENOUS

## 2013-11-20 MED ORDER — OXYCODONE-ACETAMINOPHEN 5-325 MG PO TABS
1.0000 | ORAL_TABLET | ORAL | Status: DC | PRN
  Administered 2013-11-20 – 2013-11-21 (×2): 2 via ORAL
  Filled 2013-11-20 (×2): qty 2

## 2013-11-20 MED ORDER — METHOCARBAMOL 1000 MG/10ML IJ SOLN
500.0000 mg | Freq: Four times a day (QID) | INTRAVENOUS | Status: DC | PRN
  Filled 2013-11-20: qty 5

## 2013-11-20 MED ORDER — DEXTROSE 5 % IV SOLN
3.0000 g | INTRAVENOUS | Status: AC
  Administered 2013-11-20: 3 g via INTRAVENOUS
  Filled 2013-11-20: qty 3000

## 2013-11-20 MED ORDER — LACTATED RINGERS IV SOLN
INTRAVENOUS | Status: DC
  Administered 2013-11-20: 10:00:00 via INTRAVENOUS

## 2013-11-20 MED ORDER — LISINOPRIL 20 MG PO TABS
20.0000 mg | ORAL_TABLET | Freq: Every day | ORAL | Status: DC
  Administered 2013-11-20 – 2013-11-21 (×2): 20 mg via ORAL
  Filled 2013-11-20 (×3): qty 1

## 2013-11-20 MED ORDER — FENTANYL CITRATE 0.05 MG/ML IJ SOLN
INTRAMUSCULAR | Status: AC
  Filled 2013-11-20: qty 5

## 2013-11-20 MED ORDER — LISINOPRIL-HYDROCHLOROTHIAZIDE 20-25 MG PO TABS: 1.0000 | ORAL_TABLET | Freq: Every day | ORAL | Status: DC

## 2013-11-20 MED ORDER — BUPIVACAINE HCL (PF) 0.25 % IJ SOLN
INTRAMUSCULAR | Status: DC | PRN
  Administered 2013-11-20: 20 mL

## 2013-11-20 MED ORDER — METOCLOPRAMIDE HCL 10 MG PO TABS: 5.0000 mg | ORAL_TABLET | Freq: Three times a day (TID) | ORAL | Status: DC | PRN

## 2013-11-20 MED ORDER — LIDOCAINE HCL (CARDIAC) 20 MG/ML IV SOLN
INTRAVENOUS | Status: DC | PRN
  Administered 2013-11-20: 80 mg via INTRAVENOUS

## 2013-11-20 MED ORDER — METHOCARBAMOL 500 MG PO TABS
500.0000 mg | ORAL_TABLET | Freq: Four times a day (QID) | ORAL | Status: DC | PRN
  Administered 2013-11-20 (×2): 500 mg via ORAL
  Filled 2013-11-20 (×2): qty 1

## 2013-11-20 MED ORDER — BACLOFEN 10 MG PO TABS: 10.0000 mg | ORAL_TABLET | Freq: Three times a day (TID) | ORAL | Status: DC

## 2013-11-20 MED ORDER — BUPIVACAINE HCL (PF) 0.25 % IJ SOLN
INTRAMUSCULAR | Status: AC
  Filled 2013-11-20: qty 30

## 2013-11-20 MED ORDER — SENNA 8.6 MG PO TABS
1.0000 | ORAL_TABLET | Freq: Two times a day (BID) | ORAL | Status: DC
  Administered 2013-11-20 – 2013-11-21 (×3): 8.6 mg via ORAL
  Filled 2013-11-20 (×4): qty 1

## 2013-11-20 SURGICAL SUPPLY — 59 items
APL SKNCLS STERI-STRIP NONHPOA (GAUZE/BANDAGES/DRESSINGS) ×1
BANDAGE ELASTIC 6 VELCRO ST LF (GAUZE/BANDAGES/DRESSINGS) IMPLANT
BANDAGE ESMARK 6X9 LF (GAUZE/BANDAGES/DRESSINGS) IMPLANT
BENZOIN TINCTURE PRP APPL 2/3 (GAUZE/BANDAGES/DRESSINGS) ×2 IMPLANT
BLADE CUTTER GATOR 3.5 (BLADE) ×2 IMPLANT
BLADE SURG 11 STRL SS (BLADE) IMPLANT
BLADE SURG ROTATE 9660 (MISCELLANEOUS) IMPLANT
BNDG CMPR 9X6 STRL LF SNTH (GAUZE/BANDAGES/DRESSINGS)
BNDG CMPR MED 10X6 ELC LF (GAUZE/BANDAGES/DRESSINGS) ×1
BNDG ELASTIC 6X10 VLCR STRL LF (GAUZE/BANDAGES/DRESSINGS) ×2 IMPLANT
BNDG ESMARK 6X9 LF (GAUZE/BANDAGES/DRESSINGS)
CLOSURE STERI-STRIP 1/2X4 (GAUZE/BANDAGES/DRESSINGS) ×1
CLSR STERI-STRIP ANTIMIC 1/2X4 (GAUZE/BANDAGES/DRESSINGS) ×1 IMPLANT
COVER SURGICAL LIGHT HANDLE (MISCELLANEOUS) ×3 IMPLANT
CUFF TOURNIQUET SINGLE 34IN LL (TOURNIQUET CUFF) IMPLANT
CUTTER MENISCUS 3.5MM 6/BX (BLADE) IMPLANT
DRAPE ARTHROSCOPY W/POUCH 114 (DRAPES) ×3 IMPLANT
DRAPE SPLIT 77X100IN (DRAPES) ×2 IMPLANT
DRAPE TABLE COVER HEAVY DUTY (DRAPES) ×2 IMPLANT
DRAPE U-SHAPE 47X51 STRL (DRAPES) ×3 IMPLANT
DRSG PAD ABDOMINAL 8X10 ST (GAUZE/BANDAGES/DRESSINGS) ×4 IMPLANT
GAUZE SPONGE 4X4 12PLY STRL (GAUZE/BANDAGES/DRESSINGS) IMPLANT
GAUZE XEROFORM 1X8 LF (GAUZE/BANDAGES/DRESSINGS) IMPLANT
GLOVE BIO SURGEON STRL SZ7.5 (GLOVE) ×3 IMPLANT
GLOVE BIOGEL PI IND STRL 8 (GLOVE) ×1 IMPLANT
GLOVE BIOGEL PI INDICATOR 8 (GLOVE) ×2
GLOVE BIOGEL PI ORTHO PRO SZ8 (GLOVE) ×2
GLOVE PI ORTHO PRO STRL SZ8 (GLOVE) ×1 IMPLANT
GLOVE SURG ORTHO 8.0 STRL STRW (GLOVE) ×6 IMPLANT
GOWN STRL REUS W/ TWL LRG LVL3 (GOWN DISPOSABLE) ×2 IMPLANT
GOWN STRL REUS W/ TWL XL LVL3 (GOWN DISPOSABLE) ×2 IMPLANT
GOWN STRL REUS W/TWL LRG LVL3 (GOWN DISPOSABLE) ×6
GOWN STRL REUS W/TWL XL LVL3 (GOWN DISPOSABLE) ×6
KIT ROOM TURNOVER OR (KITS) ×3 IMPLANT
MANIFOLD NEPTUNE II (INSTRUMENTS) IMPLANT
NDL 18GX1X1/2 (RX/OR ONLY) (NEEDLE) IMPLANT
NDL SPNL 18GX3.5 QUINCKE PK (NEEDLE) IMPLANT
NEEDLE 18GX1X1/2 (RX/OR ONLY) (NEEDLE) IMPLANT
NEEDLE 22X1 1/2 (OR ONLY) (NEEDLE) ×3 IMPLANT
NEEDLE SPNL 18GX3.5 QUINCKE PK (NEEDLE) IMPLANT
NS IRRIG 1000ML POUR BTL (IV SOLUTION) IMPLANT
PACK ARTHROSCOPY DSU (CUSTOM PROCEDURE TRAY) ×3 IMPLANT
PAD ARMBOARD 7.5X6 YLW CONV (MISCELLANEOUS) ×6 IMPLANT
PAD CAST 4YDX4 CTTN HI CHSV (CAST SUPPLIES) IMPLANT
PADDING CAST COTTON 4X4 STRL (CAST SUPPLIES) ×3
PADDING CAST COTTON 6X4 STRL (CAST SUPPLIES) IMPLANT
SET ARTHROSCOPY TUBING (MISCELLANEOUS) ×3
SET ARTHROSCOPY TUBING LN (MISCELLANEOUS) ×1 IMPLANT
SPONGE GAUZE 4X4 12PLY STER LF (GAUZE/BANDAGES/DRESSINGS) ×2 IMPLANT
SPONGE LAP 4X18 X RAY DECT (DISPOSABLE) ×3 IMPLANT
SUT MNCRL AB 4-0 PS2 18 (SUTURE) ×3 IMPLANT
SYR 20ML ECCENTRIC (SYRINGE) IMPLANT
SYR CONTROL 10ML LL (SYRINGE) IMPLANT
TOWEL OR 17X24 6PK STRL BLUE (TOWEL DISPOSABLE) ×3 IMPLANT
TOWEL OR 17X26 10 PK STRL BLUE (TOWEL DISPOSABLE) ×3 IMPLANT
TUBE CONNECTING 12'X1/4 (SUCTIONS) ×1
TUBE CONNECTING 12X1/4 (SUCTIONS) ×2 IMPLANT
WAND HAND CNTRL MULTIVAC 90 (MISCELLANEOUS) IMPLANT
WATER STERILE IRR 1000ML POUR (IV SOLUTION) ×3 IMPLANT

## 2013-11-20 NOTE — H&P (Signed)
PREOPERATIVE H&P  Chief Complaint: Right medial knee pain   HPI: Justin Norman is a 43 y.o. male who presents for preoperative history and physical with a diagnosis of right knee medial meniscus tear . Symptoms are rated as moderate to severe, and have been worsening.  This is significantly impairing activities of daily living.  He has elected for surgical management. He has had symptoms for almost 4 or 5 months now, failed injections, anti-inflammatories, activity modification. He complains of ongoing locking around the knee. Pain is rated 6/10.  Past Medical History  Diagnosis Date  . ERECTILE DYSFUNCTION 03/03/2007  . HYPERTENSION NEC 03/03/2007  . HYPERTENSION 07/03/2007  . Obesities, morbid   . Sleep apnea 09/2012    does not use Cpap  . Anxiety     during a divorce  . Kidney stone   . GERD (gastroesophageal reflux disease)     not taking meds  . Headache     headaches  . Dry skin    Past Surgical History  Procedure Laterality Date  . Vocal cord abscess surgery     History   Social History  . Marital Status: Divorced    Spouse Name: N/A    Number of Children: 2  . Years of Education: N/A   Occupational History  .  [employer]   Social History Main Topics  . Smoking status: Current Every Day Smoker -- 0.50 packs/day for 15 years    Types: Cigarettes  . Smokeless tobacco: Never Used  . Alcohol Use: Yes     Comment: occasional  . Drug Use: No  . Sexual Activity: None   Other Topics Concern  . None   Social History Narrative  . None   Family History  Problem Relation Age of Onset  . Hypertension Mother   . Arthritis Mother   . Diabetes Mother   . Diabetes Sister   . Diabetes Sister   . Hypertension Sister   . Arrhythmia Sister     has pacemaker   No Known Allergies Prior to Admission medications   Medication Sig Start Date End Date Taking? Authorizing Provider  ibuprofen (ADVIL,MOTRIN) 200 MG tablet Take 200 mg by mouth every 6 (six) hours as  needed for mild pain.   Yes Historical Provider, MD  lisinopril-hydrochlorothiazide (PRINZIDE,ZESTORETIC) 20-25 MG per tablet Take 1 tablet by mouth daily. 02/16/13  Yes Marletta Lor, MD     Positive ROS: All other systems have been reviewed and were otherwise negative with the exception of those mentioned in the HPI and as above.  Physical Exam: General: Alert, no acute distress Cardiovascular: No pedal edema Respiratory: No cyanosis, no use of accessory musculature GI: No organomegaly, abdomen is soft and non-tender Skin: No lesions in the area of chief complaint Neurologic: Sensation intact distally Psychiatric: Patient is competent for consent with normal mood and affect Lymphatic: No axillary or cervical lymphadenopathy  MUSCULOSKELETAL: Right knee has positive medial joint line tenderness, active motion is from 10 to 110, but this is limited primarily by pain. He is stable to varus and valgus stress with pain on varus stress testing.  MRI demonstrates a complex tear of the medial meniscus at the posterior horn.  Assessment: Lateral meniscal tear, right, initial encounter   Plan: Plan for Procedure(s): RIGHT KNEE ARTHROSCOPY WITH MEDIAL MENISCECTOMY   The risks benefits and alternatives were discussed with the patient including but not limited to the risks of nonoperative treatment, versus surgical intervention including infection, bleeding, nerve injury,  blood clots, cardiopulmonary complications, morbidity, mortality, among others, and they were willing to proceed.   Johnny Bridge, MD Cell [phone]   11/20/2013 7:23 AM

## 2013-11-20 NOTE — Transfer of Care (Signed)
Immediate Anesthesia Transfer of Care Note  Patient: Justin Norman  Procedure(s) Performed: Procedure(s): RIGHT KNEE ARTHROSCOPY WITH MEDIAL MENISCECTOMY  (Right)  Patient Location: PACU  Anesthesia Type:General  Level of Consciousness: awake, alert  and oriented  Airway & Oxygen Therapy: Patient Spontanous Breathing and Patient connected to face mask oxygen  Post-op Assessment: Report given to PACU RN, Post -op Vital signs reviewed and stable and Patient moving all extremities X 4  Post vital signs: Reviewed and stable  Complications: No apparent anesthesia complications

## 2013-11-20 NOTE — Discharge Instructions (Signed)
Diet: As you were doing prior to hospitalization   Shower:  May shower but keep the wounds dry, use an occlusive plastic wrap, NO SOAKING IN TUB.  If the bandage gets wet, change with a clean dry gauze.  Dressing:  You may change your dressing 3-5 days after surgery.  Then change the dressing daily with sterile gauze dressing.    There are sticky tapes (steri-strips) on your wounds and all the stitches are absorbable.  Leave the steri-strips in place when changing your dressings, they will peel off with time, usually 2-3 weeks.  Activity:  Increase activity slowly as tolerated, but follow the weight bearing instructions below.  No lifting or driving for 6 weeks.  Weight Bearing:   As tolerated.    To prevent constipation: you may use a stool softener such as -  Colace (over the counter) 100 mg by mouth twice a day  Drink plenty of fluids (prune juice may be helpful) and high fiber foods Miralax (over the counter) for constipation as needed.    Itching:  If you experience itching with your medications, try taking only a single pain pill, or even half a pain pill at a time.  You may take up to 10 pain pills per day, and you can also use benadryl over the counter for itching or also to help with sleep.   Precautions:  If you experience chest pain or shortness of breath - call 911 immediately for transfer to the hospital emergency department!!  If you develop a fever greater that 101 F, purulent drainage from wound, increased redness or drainage from wound, or calf pain -- Call the office at [phone]                                                Follow- Up Appointment:  Please call for an appointment to be seen in 2 weeks [place] - [phone]  What to eat:  For your first meals, you should eat lightly; only small meals initially.  If you do not have nausea, you may eat larger meals.  Avoid spicy, greasy and heavy food.    General Anesthesia, Adult, Care After  Refer to this sheet  in the next few weeks. These instructions provide you with information on caring for yourself after your procedure. Your health care provider may also give you more specific instructions. Your treatment has been planned according to current medical practices, but problems sometimes occur. Call your health care provider if you have any problems or questions after your procedure.  WHAT TO EXPECT AFTER THE PROCEDURE  After the procedure, it is typical to experience:  Sleepiness.  Nausea and vomiting. HOME CARE INSTRUCTIONS  For the first 24 hours after general anesthesia:  Have a responsible person with you.  Do not drive a car. If you are alone, do not take public transportation.  Do not drink alcohol.  Do not take medicine that has not been prescribed by your health care provider.  Do not sign important papers or make important decisions.  You may resume a normal diet and activities as directed by your health care provider.  Change bandages (dressings) as directed.  If you have questions or problems that seem related to general anesthesia, call the hospital and ask for the anesthetist or anesthesiologist on call. SEEK MEDICAL CARE IF:  You have nausea  and vomiting that continue the day after anesthesia.  You develop a rash. SEEK IMMEDIATE MEDICAL CARE IF:  You have difficulty breathing.  You have chest pain.  You have any allergic problems. Document Released: 05/07/2000 Document Revised: 10/01/2012 Document Reviewed: 08/14/2012  [facility] Patient Information 2014 [place], [place].

## 2013-11-20 NOTE — Anesthesia Preprocedure Evaluation (Addendum)
Anesthesia Evaluation  Patient identified by MRN, date of birth, ID band Patient awake    Reviewed: Allergy & Precautions, H&P , NPO status , Patient's Chart, lab work & pertinent test results  Airway Mallampati: II TM Distance: >3 FB Neck ROM: full    Dental  (+) Teeth Intact, Dental Advidsory Given   Pulmonary sleep apnea and Continuous Positive Airway Pressure Ventilation , Current Smoker,    Pulmonary exam normal       Cardiovascular hypertension,     Neuro/Psych  Headaches,    GI/Hepatic GERD-  Medicated and Controlled,  Endo/Other    Renal/GU      Musculoskeletal   Abdominal   Peds  Hematology   Anesthesia Other Findings   Reproductive/Obstetrics                          Anesthesia Physical Anesthesia Plan  ASA: III  Anesthesia Plan: General   Post-op Pain Management:    Induction: Intravenous  Airway Management Planned: Oral ETT  Additional Equipment:   Intra-op Plan:   Post-operative Plan: Extubation in OR  Informed Consent: I have reviewed the patients History and Physical, chart, labs and discussed the procedure including the risks, benefits and alternatives for the proposed anesthesia with the patient or authorized representative who has indicated his/her understanding and acceptance.   Dental Advisory Given and Dental advisory given  Plan Discussed with: Anesthesiologist, CRNA and Surgeon  Anesthesia Plan Comments:        Anesthesia Quick Evaluation

## 2013-11-20 NOTE — Anesthesia Procedure Notes (Signed)
Procedure Name: Intubation Date/Time: 11/20/2013 11:47 AM Performed by: Neldon Newport Pre-anesthesia Checklist: Patient identified, Timeout performed, Emergency Drugs available, Suction available and Patient being monitored Patient Re-evaluated:Patient Re-evaluated prior to inductionOxygen Delivery Method: Circle system utilized Preoxygenation: Pre-oxygenation with 100% oxygen Intubation Type: IV induction and Rapid sequence Ventilation: Mask ventilation without difficulty and Two handed mask ventilation required Laryngoscope Size: Mac and 4 Grade View: Grade III Tube type: Oral Tube size: 8.0 mm Number of attempts: 3 Airway Equipment and Method: Video-laryngoscopy Placement Confirmation: positive ETCO2,  ETT inserted through vocal cords under direct vision and breath sounds checked- equal and bilateral Secured at: 24 cm Tube secured with: Tape Dental Injury: Teeth and Oropharynx as per pre-operative assessment

## 2013-11-20 NOTE — Op Note (Signed)
11/20/2013  12:33 PM  PATIENT:  Justin Norman    PRE-OPERATIVE DIAGNOSIS:  Medial meniscal tear, right  POST-OPERATIVE DIAGNOSIS:  Same  PROCEDURE:  RIGHT KNEE ARTHROSCOPY WITH MEDIAL MENISCECTOMY   SURGEON:  Johnny Bridge, MD  PHYSICIAN ASSISTANT: Joya Gaskins, OPA-C, present and scrubbed throughout the case, critical for completion in a timely fashion, and for retraction, instrumentation, and closure.  Second assistant: Mechele Claude, PA-Student ANESTHESIA:   General  PREOPERATIVE INDICATIONS:  [name] is a  43 y.o. male with a diagnosis of Lateral meniscal tear, right, initial encounter  who failed conservative measures and elected for surgical management.    The risks benefits and alternatives were discussed with the patient preoperatively including but not limited to the risks of infection, bleeding, nerve injury, cardiopulmonary complications, the need for revision surgery, among others, and the patient was willing to proceed.  OPERATIVE IMPLANTS: none  OPERATIVE FINDINGS: intact lateral and patellofemoral compartment.  Mild grade 2 changes medial femoral condyle.  Radial posterior horn medial meniscus tear.  Medial tibial condyle intact.  OPERATIVE PROCEDURE: The patient was brought to the operating room and placed in the supine condition.  General anesthesia administered and required a glidescope.    The right lower extremity was prepped and draped in usual sterile fashion. Time out was performed. Diagnostic arthroscopy was carried out the above-named findings. The arthroscopic shaver was used to debride the posterior horn of the medial meniscus, mild also used an arthroscopic biter trimming the radial tear back to a stable configuration. Excess posterior medial knee was fairly challenging but I was able to get adequate access to debride the meniscus.  I performed a light chondroplasty of the loose fragments of cartilage that were on the femur, and once all loose  fragments and then removed the arthroscopic instrument were removed, the knee was injected, the portals closed with Monocryl followed by Steri-Strips and sterile gauze. He tolerated the procedure well and there were no complications. He has untreated severe sleep apnea and will be on a CPAP overnight tonight monitored in the hospital, discharge tomorrow if medically optimized.

## 2013-11-20 NOTE — Progress Notes (Signed)
Patient started on CPAP therapy for HS.  Patient states that he has had a sleep study, and has been diagnosed with sleep apnea, but he does not have a CPAP machine to use at home.  Nasal mask used with autotitration mode, minimum 5, maximum 20 cmH20.  Patient tolerated well. Room air.  RN notified.

## 2013-11-20 NOTE — Progress Notes (Signed)
Bp readings called to Dr Tobias Alexander.  BP in left arm 165/106 BP right arm 189/105 both taken with large cuff.  New orders noted.

## 2013-11-21 DIAGNOSIS — S83241A Other tear of medial meniscus, current injury, right knee, initial encounter: Secondary | ICD-10-CM | POA: Diagnosis not present

## 2013-11-21 MED ORDER — DSS 100 MG PO CAPS: 100.0000 mg | ORAL_CAPSULE | Freq: Two times a day (BID) | ORAL | Status: DC

## 2013-11-21 NOTE — Progress Notes (Signed)
Discharge home. Home discharge instruction given. Per PA, patient do not need CPM machine at home.Patient understood home instructions, no questions verbalized.Assisted by staff to car .

## 2013-11-21 NOTE — Progress Notes (Signed)
Utilization Review completed.  

## 2013-11-21 NOTE — Discharge Summary (Signed)
Patient ID: Quentez Lober MRN: [id number] DOB/AGE: 44-28-1972 44 y.o.  Admit date: 11/20/2013 Discharge date: 11/21/2013  Admission Diagnoses:  Principal Problem:   Tear of medial meniscus of right knee Active Problems:   Obesity, Class III, BMI 40-49.9 (morbid obesity)   OSA (obstructive sleep apnea)   Tear, knee, medial meniscus   Discharge Diagnoses:  Same  Past Medical History  Diagnosis Date  . ERECTILE DYSFUNCTION 03/03/2007  . HYPERTENSION NEC 03/03/2007  . HYPERTENSION 07/03/2007  . Obesities, morbid   . Sleep apnea 09/2012    does not use Cpap  . Anxiety     during a divorce  . Kidney stone   . GERD (gastroesophageal reflux disease)     not taking meds  . Headache     headaches  . Dry skin   . Tear of medial meniscus of right knee 11/20/2013    Surgeries: Procedure(s): RIGHT KNEE ARTHROSCOPY WITH MEDIAL MENISCECTOMY  on 11/20/2013   Consultants:    Discharged Condition: Improved  Hospital Course: Eissa Buchberger is an 43 y.o. male who was admitted 11/20/2013 for operative treatment ofTear of medial meniscus of right knee. Patient has severe unremitting pain that affects sleep, daily activities, and work/hobbies. After pre-op clearance the patient was taken to the operating room on 11/20/2013 and underwent  Procedure(s): RIGHT KNEE ARTHROSCOPY WITH MEDIAL MENISCECTOMY .    Patient was given perioperative antibiotics: Anti-infectives   Start     Dose/Rate Route Frequency Ordered Stop   11/20/13 1800  ceFAZolin (ANCEF) IVPB 2 g/50 mL premix     2 g 100 mL/hr over 30 Minutes Intravenous Every 6 hours 11/20/13 1416 11/21/13 0539   11/20/13 1000  ceFAZolin (ANCEF) 3 g in dextrose 5 % 50 mL IVPB     3 g 160 mL/hr over 30 Minutes Intravenous On call to O.R. 11/20/13 0915 11/20/13 1158       Patient was given sequential compression devices, early ambulation, and chemoprophylaxis to prevent DVT.  Patient benefited maximally from hospital stay and there were no  complications.    Recent vital signs: Patient Vitals for the past 24 hrs:  BP Temp Temp src Pulse Resp SpO2 Height Weight  11/21/13 0517 136/74 mmHg 97.9 F (36.6 C) Oral 74 18 93 % - -  11/21/13 0202 134/74 mmHg 98 F (36.7 C) Oral 82 18 96 % - -  11/20/13 2224 - - - 84 16 97 % - -  11/20/13 1950 145/61 mmHg 97.5 F (36.4 C) Oral 83 18 96 % - -  11/20/13 1420 141/105 mmHg 97.5 F (36.4 C) Oral 98 - 96 % - -  11/20/13 1353 173/97 mmHg 97.7 F (36.5 C) - 91 18 97 % - -  11/20/13 1345 175/100 mmHg - - 83 23 98 % - -  11/20/13 1330 168/104 mmHg - - 87 28 98 % - -  11/20/13 1315 150/94 mmHg - - 86 14 100 % - -  11/20/13 1304 124/38 mmHg 97.8 F (36.6 C) - 88 12 97 % - -  11/20/13 0939 189/105 mmHg - - - - - - -  11/20/13 6314 - - - - - - 6\' 1"  (1.854 m) 172.14 kg (379 lb 8 oz)  11/20/13 0937 165/106 mmHg - - - - - - -     Recent laboratory studies:  Recent Labs  11/20/13 0909  WBC 6.1  HGB 16.4  HCT 47.1  PLT 180  NA 139  K 4.1  CL 102  CO2 24  BUN 17  CREATININE 1.05  GLUCOSE 102*  CALCIUM 9.0     Discharge Medications:     Medication List         baclofen 10 MG tablet  Commonly known as:  LIORESAL  Take 1 tablet (10 mg total) by mouth 3 (three) times daily. As needed for muscle spasm     DSS 100 MG Caps  Take 100 mg by mouth 2 (two) times daily.     ibuprofen 200 MG tablet  Commonly known as:  ADVIL,MOTRIN  Take 200 mg by mouth every 6 (six) hours as needed for mild pain.     lisinopril-hydrochlorothiazide 20-25 MG per tablet  Commonly known as:  PRINZIDE,ZESTORETIC  Take 1 tablet by mouth daily.     ondansetron 4 MG tablet  Commonly known as:  ZOFRAN  Take 1 tablet (4 mg total) by mouth every 8 (eight) hours as needed for nausea or vomiting.     oxyCODONE-acetaminophen 10-325 MG per tablet  Commonly known as:  PERCOCET  Take 1-2 tablets by mouth every 6 (six) hours as needed for pain. MAXIMUM TOTAL ACETAMINOPHEN DOSE IS 4000 MG PER DAY      sennosides-docusate sodium 8.6-50 MG tablet  Commonly known as:  SENOKOT-S  Take 2 tablets by mouth daily.        Diagnostic Studies: Dg Chest 2 View  11/20/2013   CLINICAL DATA:  Preop for meniscal repair  EXAM: CHEST  2 VIEW  COMPARISON:  09/22/2006  FINDINGS: The heart size and mediastinal contours are within normal limits. Both lungs are clear. The visualized skeletal structures are unremarkable.  IMPRESSION: No active cardiopulmonary disease.   Electronically Signed   By: Kathreen Devoid   On: 11/20/2013 09:48   Mr Knee Right Wo Contrast  10/31/2013   CLINICAL DATA:  Right posterior knee pain and swelling for 2 weeks.  EXAM: MRI OF THE RIGHT KNEE WITHOUT CONTRAST  TECHNIQUE: Multiplanar, multisequence MR imaging of the knee was performed. No intravenous contrast was administered.  COMPARISON:  None.  FINDINGS: Body habitus reduces diagnostic sensitivity and specificity.  MENISCI  Medial meniscus: Large radial tear of the posterior horn of the medial meniscus 1 cm lateral to the meniscal root.  Lateral meniscus:  Intact  LIGAMENTS  Cruciates:  Intact  Collaterals:  Intact  CARTILAGE  Patellofemoral:  Intact  Medial:  Intact  Lateral:  Intact  Joint:  Small knee effusion.  Popliteal Fossa:  Small Baker's cyst.  Extensor Mechanism:  Mild proximal patellar tendinopathy.  Bones:  Intact  IMPRESSION: 1. Large radial tear of the posterior horn medial meniscus, 1 cm lateral to the meniscal root. 2. Small knee effusion and small Baker's cyst. 3. Mild proximal patellar tendinopathy.   Electronically Signed   By: Sherryl Barters M.D.   On: 10/31/2013 10:10    Disposition: 01-Home or Self Care      Discharge Instructions   CPM    Complete by:  As directed   Continuous passive motion machine (CPM):      Use the CPM from 0 to 90 for 6 hours per day.       You may break it up into 2 or 3 sessions per day.      Use CPM for 2 weeks or until you are told to stop.     Call MD / Call 911    Complete by:  As  directed   If you experience chest pain or shortness  of breath, CALL 911 and be transported to the hospital emergency room.  If you develope a fever above 101 F, pus (white drainage) or increased drainage or redness at the wound, or calf pain, call your surgeon's office.     Call MD / Call 911    Complete by:  As directed   If you experience chest pain or shortness of breath, CALL 911 and be transported to the hospital emergency room.  If you develope a fever above 101 F, pus (white drainage) or increased drainage or redness at the wound, or calf pain, call your surgeon's office.     Change dressing    Complete by:  As directed   Change the dressing daily with sterile 4 x 4 inch gauze dressing and apply TED hose.  You may clean the incision with alcohol prior to redressing.     Constipation Prevention    Complete by:  As directed   Drink plenty of fluids.  Prune juice may be helpful.  You may use a stool softener, such as Colace (over the counter) 100 mg twice a day.  Use MiraLax (over the counter) for constipation as needed.     Constipation Prevention    Complete by:  As directed   Drink plenty of fluids.  Prune juice may be helpful.  You may use a stool softener, such as Colace (over the counter) 100 mg twice a day.  Use MiraLax (over the counter) for constipation as needed.     Diet - low sodium heart healthy    Complete by:  As directed      Diet general    Complete by:  As directed      Discharge instructions    Complete by:  As directed   Change dressing in 3 days and reapply fresh dressing, unless you have a splint (half cast).  If you have a splint/cast, just leave in place until your follow-up appointment.    Keep wounds dry for 3 weeks.  Leave steri-strips in place on skin.  Do not apply lotion or anything to the wound.     Discharge instructions    Complete by:  As directed   Knee arthroscopy Care After Refer to this sheet in the next few weeks. These discharge instructions  provide you with general information on caring for yourself after you leave the hospital. Your caregiver may also give you specific instructions. Your treatment has been planned according to the most current medical practices available, but unavoidable complications sometimes occur. If you have any problems or questions after discharge, please call your caregiver. HOME INSTRUCTIONS You may resume a normal diet and activities as directed. Perform exercises as directed.  Take showers instead of baths until informed otherwise.  Change bandages (dressings) in 3 days.  Swab wounds daily with betadine.  Wash leg with soap and water.  Pat dry.  Cover wounds with bandaids. Only take over-the-counter or prescription medicines for pain, discomfort, or fever as directed by your caregiver.  Eat a well-balanced diet.  Avoid lifting or driving until you are instructed otherwise.  Make an appointment to see your caregiver for stitches (suture) or staple removal as directed.   SEEK MEDICAL CARE IF: You have swelling of your calf or leg.  You develop shortness of breath or chest pain.  You have redness, swelling, or increasing pain in the wound.  There is pus or any unusual drainage coming from the surgical site.  You notice a bad smell  coming from the surgical site or dressing.  The surgical site breaks open after sutures or staples have been removed.  There is persistent bleeding from the suture or staple line.  You are getting worse or are not improving.  You have any other questions or concerns.  SEEK IMMEDIATE MEDICAL CARE IF:  You have a fever.  You develop a rash.  You have difficulty breathing.  You develop any reaction or side effects to medicines given.  Your knee motion is decreasing rather than improving.  MAKE SURE YOU:  Understand these instructions.  Will watch your condition.  Will get help right away if you are not doing well or get worse.     Do not put a pillow under the knee. Place  it under the heel.    Complete by:  As directed   Place yellow foam block, yellow side up under heel at all times except when in CPM or when walking.  DO NOT modify, tear, cut, or change in any way the yellow foam block.     Increase activity slowly as tolerated    Complete by:  As directed      TED hose    Complete by:  As directed   Use stockings (TED hose) for 2 weeks on both leg(s).  You may remove them at night for sleeping.           Follow-up Information   Follow up with Johnny Bridge, MD. Schedule an appointment as soon as possible for a visit in 2 weeks.   Specialty:  Orthopedic Surgery   Contact information:   [address] [place] [id number] [phone]        Signed: Linda Hedges 11/21/2013, 9:15 AM

## 2013-11-21 NOTE — Evaluation (Signed)
Physical Therapy Evaluation Patient Details Name: Justin Norman MRN: [id number] DOB: 1970/12/13 Today's Date: 11/21/2013   History of Present Illness  Pt admitted with lateral meniscal tear R knee.  He underwent R knee arthroscopy and meniscal repair 11-20-13.  Clinical Impression  Pt is independent to modified independent with all mobility.  No home equipment needs or follow up services indicated at this time.  Recommend OPPT when cleared by MD.    Follow Up Recommendations No PT follow up (Recommend OPPT when cleared by MD.)    Equipment Recommendations  None recommended by PT    Recommendations for Other Services       Precautions / Restrictions Precautions Precautions: None Restrictions Weight Bearing Restrictions: Yes RLE Weight Bearing: Weight bearing as tolerated      Mobility  Bed Mobility Overal bed mobility: Independent                Transfers Overall transfer level: Independent Equipment used: None                Ambulation/Gait Ambulation/Gait assistance: Modified independent (Device/Increase time) Ambulation Distance (Feet): 50 Feet Assistive device: None Gait Pattern/deviations: Antalgic;Step-to pattern Gait velocity: decreased      Stairs Stairs:  (Pt declined stair training, reporting no questions or concerns.)          Wheelchair Mobility    Modified Rankin (Stroke Patients Only)       Balance Overall balance assessment: Independent                                           Pertinent Vitals/Pain Pain Assessment: 0-10 Pain Score: 3  Pain Location: R knee Pain Intervention(s): Monitored during session;Premedicated before session    Home Living Family/patient expects to be discharged to:: Private residence Living Arrangements: Spouse/significant other Available Help at Discharge: Family Type of Home: House Home Access: Stairs to enter Entrance Stairs-Rails: Left Entrance Stairs-Number of  Steps: 2 Home Layout: One level Home Equipment: None      Prior Function Level of Independence: Independent               Hand Dominance        Extremity/Trunk Assessment               Lower Extremity Assessment: RLE deficits/detail RLE Deficits / Details: R knee AROM 0-90 degrees in sitting    Cervical / Trunk Assessment: Normal  Communication   Communication: No difficulties  Cognition Arousal/Alertness: Awake/alert Behavior During Therapy: WFL for tasks assessed/performed Overall Cognitive Status: Within Functional Limits for tasks assessed                      General Comments      Exercises        Assessment/Plan    PT Assessment Patent does not need any further PT services  PT Diagnosis Difficulty walking;Acute pain   PT Problem List    PT Treatment Interventions     PT Goals (Current goals can be found in the Care Plan section) Acute Rehab PT Goals Patient Stated Goal: home PT Goal Formulation: No goals set, d/c therapy    Frequency     Barriers to discharge        Co-evaluation               End of Session   Activity Tolerance: Patient  tolerated treatment well Patient left: in chair;with family/visitor present Nurse Communication: Mobility status    Functional Assessment Tool Used: clinical judgement Functional Limitation: Mobility: Walking and moving around Mobility: Walking and Moving Around Current Status ([id number]): At least 1 percent but less than 20 percent impaired, limited or restricted Mobility: Walking and Moving Around Goal Status [phone]): At least 1 percent but less than 20 percent impaired, limited or restricted Mobility: Walking and Moving Around Discharge Status [phone]): At least 1 percent but less than 20 percent impaired, limited or restricted    Time: 1022-1031 PT Time Calculation (min): 9 min   Charges:   PT Evaluation $Initial PT Evaluation Tier I: 1 Procedure     PT G Codes:   Functional  Assessment Tool Used: clinical judgement Functional Limitation: Mobility: Walking and moving around    Lorriane Shire 11/21/2013, 11:14 AM

## 2013-11-21 NOTE — Care Management Note (Signed)
    Page 1 of 1   11/21/2013     11:08:58 AM CARE MANAGEMENT NOTE 11/21/2013  Patient:  Justin Norman, Justin Norman   Account Number:  [id number]  Date Initiated:  11/21/2013  Documentation initiated by:  [facility]  Subjective/Objective Assessment:   adm:     Action/Plan:   discharge planning   Anticipated DC Date:  11/21/2013   Anticipated DC Plan:  [place]  CM consult      Choice offered to / List presented to:     DME arranged  CPM      DME agency  [employer]        Status of service:  Completed, signed off Medicare Important Message given?   (If response is "NO", the following Medicare IM given date fields will be blank) Date Medicare IM given:   Medicare IM given by:   Date Additional Medicare IM given:   Additional Medicare IM given by:    Discharge Disposition:  HOME/SELF CARE  Per UR Regulation:    If discussed at Long Length of Stay Meetings, dates discussed:    Comments:  11/21/13 10:30 CM met with pt in room to verify address and contact information for requested CPM.  CM requested RN, Joaquim Lai, to get orders from MD while I begin to arrange with [employer].  CM called [employer] [place] [employer], to arrange but then received call from RN to state MD no longer wants pt to have CPM. No HH orders.   No other CM needs were communicated.  Mariane Masters, BSn, CM [phone].

## 2013-11-23 ENCOUNTER — Encounter (HOSPITAL_COMMUNITY): Payer: Self-pay | Admitting: Orthopedic Surgery

## 2013-11-27 NOTE — Anesthesia Postprocedure Evaluation (Signed)
Anesthesia Post Note  Patient: Justin Norman  Procedure(s) Performed: Procedure(s) (LRB): RIGHT KNEE ARTHROSCOPY WITH MEDIAL MENISCECTOMY  (Right)  Anesthesia type: general  Patient location: PACU  Post pain: Pain level controlled  Post assessment: Patient's Cardiovascular Status Stable  Post vital signs: Reviewed and stable  Level of consciousness: sedated  Complications: No apparent anesthesia complications

## 2013-12-23 ENCOUNTER — Other Ambulatory Visit (INDEPENDENT_AMBULATORY_CARE_PROVIDER_SITE_OTHER): Payer: PRIVATE HEALTH INSURANCE

## 2013-12-23 DIAGNOSIS — Z0000 Encounter for general adult medical examination without abnormal findings: Secondary | ICD-10-CM

## 2013-12-23 LAB — CBC WITH DIFFERENTIAL/PLATELET
BASOS PCT: 0.4 % (ref 0.0–3.0)
Basophils Absolute: 0 10*3/uL (ref 0.0–0.1)
Eosinophils Absolute: 0.3 10*3/uL (ref 0.0–0.7)
Eosinophils Relative: 3.9 % (ref 0.0–5.0)
HCT: 48.4 % (ref 39.0–52.0)
HEMOGLOBIN: 16.2 g/dL (ref 13.0–17.0)
LYMPHS PCT: 34.4 % (ref 12.0–46.0)
Lymphs Abs: 2.5 10*3/uL (ref 0.7–4.0)
MCHC: 33.4 g/dL (ref 30.0–36.0)
MCV: 91.6 fl (ref 78.0–100.0)
MONOS PCT: 9.4 % (ref 3.0–12.0)
Monocytes Absolute: 0.7 10*3/uL (ref 0.1–1.0)
Neutro Abs: 3.7 10*3/uL (ref 1.4–7.7)
Neutrophils Relative %: 51.9 % (ref 43.0–77.0)
Platelets: 227 10*3/uL (ref 150.0–400.0)
RBC: 5.28 Mil/uL (ref 4.22–5.81)
RDW: 14.3 % (ref 11.5–15.5)
WBC: 7.2 10*3/uL (ref 4.0–10.5)

## 2013-12-23 LAB — LIPID PANEL
Cholesterol: 206 mg/dL — ABNORMAL HIGH (ref 0–200)
HDL: 62.8 mg/dL (ref >39.00)
LDL Cholesterol: 121 mg/dL — ABNORMAL HIGH (ref 0–99)
NonHDL: 143.2
TRIGLYCERIDES: 111 mg/dL (ref 0.0–149.0)
Total CHOL/HDL Ratio: 3
VLDL: 22.2 mg/dL (ref 0.0–40.0)

## 2013-12-23 LAB — HEPATIC FUNCTION PANEL
ALK PHOS: 43 U/L (ref 39–117)
ALT: 33 U/L (ref 0–53)
AST: 20 U/L (ref 0–37)
Albumin: 3.2 g/dL — ABNORMAL LOW (ref 3.5–5.2)
Bilirubin, Direct: 0.1 mg/dL (ref 0.0–0.3)
TOTAL PROTEIN: 7.1 g/dL (ref 6.0–8.3)
Total Bilirubin: 0.8 mg/dL (ref 0.2–1.2)

## 2013-12-23 LAB — POCT URINALYSIS DIPSTICK
BILIRUBIN UA: NEGATIVE
Glucose, UA: NEGATIVE
LEUKOCYTES UA: NEGATIVE
NITRITE UA: NEGATIVE
Protein, UA: NEGATIVE
RBC UA: NEGATIVE
Spec Grav, UA: 1.02
Urobilinogen, UA: 0.2
pH, UA: 5.5

## 2013-12-23 LAB — TSH: TSH: 2.39 u[IU]/mL (ref 0.35–4.50)

## 2013-12-23 LAB — BASIC METABOLIC PANEL
BUN: 13 mg/dL (ref 6–23)
CHLORIDE: 101 meq/L (ref 96–112)
CO2: 24 mEq/L (ref 19–32)
CREATININE: 1.2 mg/dL (ref 0.4–1.5)
Calcium: 9.4 mg/dL (ref 8.4–10.5)
GFR: 87.9 mL/min (ref >60.00)
Glucose, Bld: 117 mg/dL — ABNORMAL HIGH (ref 70–99)
Potassium: 4.2 mEq/L (ref 3.5–5.1)
Sodium: 140 mEq/L (ref 135–145)

## 2013-12-23 LAB — PSA: PSA: 0.63 ng/mL (ref 0.10–4.00)

## 2013-12-31 ENCOUNTER — Ambulatory Visit (INDEPENDENT_AMBULATORY_CARE_PROVIDER_SITE_OTHER): Payer: PRIVATE HEALTH INSURANCE | Admitting: Internal Medicine

## 2013-12-31 ENCOUNTER — Encounter: Payer: Self-pay | Admitting: Internal Medicine

## 2013-12-31 VITALS — BP 150/90 | HR 86 | Temp 98.5°F | Resp 20 | Ht 73.0 in | Wt 385.0 lb

## 2013-12-31 DIAGNOSIS — Z23 Encounter for immunization: Secondary | ICD-10-CM

## 2013-12-31 DIAGNOSIS — G4733 Obstructive sleep apnea (adult) (pediatric): Secondary | ICD-10-CM

## 2013-12-31 DIAGNOSIS — Z0000 Encounter for general adult medical examination without abnormal findings: Secondary | ICD-10-CM

## 2013-12-31 DIAGNOSIS — I10 Essential (primary) hypertension: Secondary | ICD-10-CM

## 2013-12-31 MED ORDER — LISINOPRIL-HYDROCHLOROTHIAZIDE 20-25 MG PO TABS: 1.0000 | ORAL_TABLET | Freq: Every day | ORAL | Status: DC

## 2013-12-31 NOTE — Progress Notes (Signed)
Subjective:    Patient ID: Justin Norman, male    DOB: 1971-01-26, 43 y.o.   MRN: [id number]  HPI  Wt Readings from Last 3 Encounters:  12/31/13 385 lb (174.635 kg)  11/20/13 379 lb 8 oz (172.14 kg)  06/22/13 383 lb (173.85 kg)   43 year old patient who is seen today for a preventive health examination.  Medical problems include exogenous obesity.  He has had recent right knee arthroscopic surgery.  Comorbidities also include OSA as well as hypertension.  He has a history of testosterone deficiency and ED. Presently is not on testosterone replacement He feels that he is sleeping much better, although has not used CPAP consistently in the past.  There have been both issues with tolerability and affordability. Family history.  Recently lost his mother last month.  Complications of advanced dementia  Past Medical History  Diagnosis Date  . ERECTILE DYSFUNCTION 03/03/2007  . HYPERTENSION NEC 03/03/2007  . HYPERTENSION 07/03/2007  . Obesities, morbid   . Sleep apnea 09/2012    does not use Cpap  . Anxiety     during a divorce  . Kidney stone   . GERD (gastroesophageal reflux disease)     not taking meds  . Headache     headaches  . Dry skin   . Tear of medial meniscus of right knee 11/20/2013    History   Social History  . Marital Status: Divorced    Spouse Name: N/A    Number of Children: 2  . Years of Education: N/A   Occupational History  .  [employer]   Social History Main Topics  . Smoking status: Current Every Day Smoker -- 0.50 packs/day for 15 years    Types: Cigarettes  . Smokeless tobacco: Never Used  . Alcohol Use: Yes     Comment: occasional  . Drug Use: No  . Sexual Activity: Not on file   Other Topics Concern  . Not on file   Social History Narrative    Past Surgical History  Procedure Laterality Date  . Vocal cord abscess surgery    . Knee arthroscopy Right 11/20/2013    Procedure: RIGHT KNEE ARTHROSCOPY WITH MEDIAL MENISCECTOMY ;   Surgeon: Johnny Bridge, MD;  Location: [facility];  Service: Orthopedics;  Laterality: Right;    Family History  Problem Relation Age of Onset  . Hypertension Mother   . Arthritis Mother   . Diabetes Mother   . Diabetes Sister   . Diabetes Sister   . Hypertension Sister   . Arrhythmia Sister     has pacemaker    No Known Allergies  Current Outpatient Prescriptions on File Prior to Visit  Medication Sig Dispense Refill  . baclofen (LIORESAL) 10 MG tablet Take 1 tablet (10 mg total) by mouth 3 (three) times daily. As needed for muscle spasm 50 tablet 0  . docusate sodium 100 MG CAPS Take 100 mg by mouth 2 (two) times daily. 10 capsule 0  . ibuprofen (ADVIL,MOTRIN) 200 MG tablet Take 200 mg by mouth every 6 (six) hours as needed for mild pain.    [name] ondansetron (ZOFRAN) 4 MG tablet Take 1 tablet (4 mg total) by mouth every 8 (eight) hours as needed for nausea or vomiting. 30 tablet 0   No current facility-administered medications on file prior to visit.    BP 150/90 mmHg  Pulse 86  Temp(Src) 98.5 F (36.9 C) (Oral)  Resp 20  Ht 6\' 1"  (1.854 m)  Wt 385 lb (174.635 kg)  BMI 50.81 kg/m2  SpO2 96%    Review of Systems  Constitutional: Negative for fever, chills, activity change, appetite change and fatigue.  HENT: Negative for congestion, dental problem, ear pain, hearing loss, mouth sores, rhinorrhea, sinus pressure, sneezing, tinnitus, trouble swallowing and voice change.   Eyes: Negative for photophobia, pain, redness and visual disturbance.  Respiratory: Negative for apnea, cough, choking, chest tightness, shortness of breath and wheezing.   Cardiovascular: Negative for chest pain, palpitations and leg swelling.  Gastrointestinal: Negative for nausea, vomiting, abdominal pain, diarrhea, constipation, blood in stool, abdominal distention, anal bleeding and rectal pain.  Genitourinary: Negative for dysuria, urgency, frequency, hematuria, flank pain, decreased urine volume,  discharge, penile swelling, scrotal swelling, difficulty urinating, genital sores and testicular pain.  Musculoskeletal: Positive for gait problem. Negative for myalgias, back pain, joint swelling, arthralgias, neck pain and neck stiffness.  Skin: Negative for color change, rash and wound.  Neurological: Negative for dizziness, tremors, seizures, syncope, facial asymmetry, speech difficulty, weakness, light-headedness, numbness and headaches.  Hematological: Negative for adenopathy. Does not bruise/bleed easily.  Psychiatric/Behavioral: Negative for suicidal ideas, hallucinations, behavioral problems, confusion, sleep disturbance, self-injury, dysphoric mood, decreased concentration and agitation. The patient is not nervous/anxious.        Objective:   Physical Exam  Constitutional: He appears well-developed and well-nourished.  HENT:  Head: Normocephalic and atraumatic.  Right Ear: External ear normal.  Left Ear: External ear normal.  Nose: Nose normal.  Mouth/Throat: Oropharynx is clear and moist.  Eyes: Conjunctivae and EOM are normal. Pupils are equal, round, and reactive to light. No scleral icterus.  Neck: Normal range of motion. Neck supple. No JVD present. No thyromegaly present.  Cardiovascular: Regular rhythm, normal heart sounds and intact distal pulses.  Exam reveals no gallop and no friction rub.   No murmur heard. Pulmonary/Chest: Effort normal and breath sounds normal. He exhibits no tenderness.  Abdominal: Soft. Bowel sounds are normal. He exhibits no distension and no mass. There is no tenderness.  Genitourinary: Penis normal.  Musculoskeletal: Normal range of motion. He exhibits no edema or tenderness.  Lymphadenopathy:    He has no cervical adenopathy.  Neurological: He is alert. He has normal reflexes. No cranial nerve deficit. Coordination normal.  Skin: Skin is warm and dry. No rash noted.  Psychiatric: He has a normal mood and affect. His behavior is normal.           Assessment & Plan:   Preventive health examination Morbid obesity.  Comorbidities include knee pain OSA and hypertension.  He has had dietary evaluation in the past and has tried a number of diets.  His activity has been limited due to right knee pain, but more recently has joined a health club.  He has been asked to consider bariatric surgery.  Information dispensed Hypertension, controlled Testosterone insufficiency OSA  Recheck 6 months

## 2013-12-31 NOTE — Patient Instructions (Addendum)
Limit your sodium (Salt) intake  Please check your blood pressure on a regular basis.  If it is consistently greater than 150/90, please make an office appointment.    It is important that you exercise regularly, at least 20 minutes 3 to 4 times per week.  If you develop chest pain or shortness of breath seek  medical attention.  You need to lose weight.  Consider a lower calorie diet and regular exercise.  Return in 6 months for follow-up Bariatric Surgery Information Severe obesity is difficult to treat through diet and exercise alone. Bariatric surgery (also called weight loss surgery) is an option for people who are severely obese and cannot lose weight by traditional means, or who suffer from serious obesity-related health problems. The surgery promotes weight loss by decreasing the absorption of food and, in some cases, by interrupting the digestive process. As in other treatments for obesity, best results are achieved with healthy eating behaviors and regular physical activity.  People who may consider bariatric surgery include those with a body mass index (BMI) above 40. Men with a BMI of 40 are about 100 lb (45 kg) overweight and women with this BMI are about 80 lb (36 kg) overweight. People with a BMI between 35 and 40 and who suffer from type 2 diabetes or life-threatening heart and lung (cardiopulmonary) problems, such as severe sleep apnea or obesity-related heart disease, may also be candidates for surgery.  THE NORMAL DIGESTIVE PROCESS Normally, as food moves along the digestive tract, digestive juices and enzymes digest and absorb calories and nutrients. After we chew and swallow our food, it moves down the esophagus to the stomach. There a strong acid continues the digestive process. When the stomach contents move to the first portion of the small intestine (duodenum), bile and pancreatic juice speed up digestion. The jejunum and ileum are the remaining two segments of the small  intestine. They complete the absorption of almost all calories and nutrients. The food particles that cannot be digested in the small intestine are stored in the large intestine until they are eliminated.  HOW DOES SURGERY PROMOTE WEIGHT LOSS? Bariatric surgery alters the digestive process. The surgery closes off parts of the stomach to make it smaller, restricting the amount of food the stomach can hold. There are two types of bariatric surgeries: restrictive surgeries and malabsorptive surgeries. Restrictive surgeries only reduce stomach size. They do not interfere with the normal digestive process. Malabsorptive surgeries combine stomach restriction with a partial bypass of the small intestine. These types of procedures create a direct connection from the stomach to the lower segment of the small intestine. The connection causes food to bypass the portions of the digestive tract that absorb calories and nutrients.Malabsorptive surgeries are the most common surgeries for weight loss. They restrict both food intake and the amount of calories and nutrients the body absorbs.  Restrictive surgeries lead to weight loss in almost all patients. But they are less successful than malabsorptive surgeries in achieving substantial, long-term weight loss. Some patients regain weight. Others are unable to adjust their eating habits and fail to lose the desired weight. Successful results depend on the patient's willingness to adopt a long-term plan of healthy eating and regular physical activity.  RESTRICTIVE SURGERY To perform a restrictive surgery, health care providers create a small pouch at the top of the stomach where food enters from the esophagus. At first, the pouch holds about 1 oz (28 g) of food. It later expands to hold 2-3  oz (56-84 g). The lower outlet of the pouch usually has a diameter of about  inch (1.9 cm). This small outlet delays the emptying of food from the pouch and causes a feeling of fullness. As  a result of this surgery, most people lose the ability to eat large amounts of food at one time. After the surgery, the person usually can eat only  to 1 c (about 2 L) of food without discomfort or nausea. Also, food has to be well chewed.  There are several types of procedures that create this pouch. Adjustable Gastric Banding In this procedure, a hollow band is placed around the stomach near its upper end. This creates a small pouch and a narrow passage into the larger remainder of the stomach. The band is then inflated with a salt solution. It can be tightened or loosened over time to change the size of the passage by increasing or decreasing the amount of salt solution.  The band is adjusted based on feelings of hunger and weight loss. Patients decide when they need an adjustment and come to their surgeons to be evaluated. The adjustment is done as an office visit. The band is fully reversible with a second surgery if the patient changes his or her mind. There is no cutting or rerouting of the intestine.  Vertical Banded Gastroplasty This is the most common restrictive surgery for weight control. Both a band and staples are used to create a small stomach pouch. Vertical banded gastroplasty is based on the same principle of restriction as adjustable gastric banding, but the stomach is surgically altered with the stapling. This treatment is not reversible.  About 30% of those who undergo vertical banded gastroplasty achieve normal weight. About 80% achieve some degree of weight loss. MALABSORPTIVE SURGERY Malabsorptive surgeries produce more weight loss than restrictive surgeries. And they are more effective in reversing the health problems associated with severe obesity. Patients who have malabsorptive surgeries generally lose two-thirds of their excess weight within 2 years. There are several types of malabsorptive surgeries. Each one carries its own benefits and risks.  Roux-en-Y Gastric Bypass  (RGB) This surgery is the most common and successful malabsorptive surgery. First, a small stomach pouch is created to restrict food intake. Next, a y-shaped section of the small intestine is attached to the pouch. This allows food to bypass the lower stomach, the duodenum, and the first portion of jejunum. This reduces the amount of calories and nutrients the body absorbs.  Biliopancreatic diversion (BPD) In this more complicated malabsorptive surgery, portions of the stomach are removed. The small pouch that remains is connected directly to the final segment of the small intestine, completely bypassing the duodenum and the jejunum.  This procedure successfully promotes weight loss. But it is less frequently used than other types of surgery because of the high risk for nutritional deficiencies. A variation of this procedure includes a "duodenal switch." This leaves a larger portion of the stomach intact, including the valve that regulates the release of stomach contents into the small intestine (pyloric valve). It also keeps a small part of the duodenum in the digestive pathway.  WHAT ARE THE BENEFITS AND RISKS OF BARIATRIC SURGERY? General Benefits  Right after surgery, most patients lose weight quickly. They continue to lose weight for 18-24 months after the procedure. Most patients regain 5-10% of the weight they lost, but many maintain a long-term weight loss of about 100 lb (45 kg).   Most obesity-related conditions, such as abnormal blood  sugar levels, improve after the surgery.  General Risks  Infection.  Abdominal hernias.   Breakdown of the staple line.   Stretched stomach outlets.  Development of gallstones. These are clumps of cholesterol and other matter that form in the gallbladder. During quick or substantial weight loss, one's risk of developing gallstones increases.   Nutritional deficiencies. Nearly 30% of patients who have bariatric surgery develop nutritional  deficiencies. These include anemia, osteoporosis, and metabolic bone disease.  A leak from any of the surgical connections (anastomoses). This is life-threatening. The more involved the surgery, the more risk involved.   Inability to lose weight or weight gain. Patients who do not follow a strict diet will stretch out their stomach pouches and they will not lose weight.   Dumping syndrome. This occurs when stomach contents move too rapidly through the small intestine causing cramping, diarrhea, nausea, palpitations, sweating, bloating, and dizziness or fainting.  Specific Risks of Restrictive Surgeries  Vomiting. This occurs when the small stomach is overly stretched by food particles that have not been chewed well.   Band slippage and saline leakage. This may occur after adjustable gastric banding.   Band erosion into the lumen of the stomach.   Wearing away of the band and breakdown of the staple line. This may occur after vertical banded gastroplasty. In a small number of cases, stomach juices may leak into the abdomen. If this happens, an emergency surgery is needed.   Death from complications (rare). This happens in only about 1% of cases.   Stomach prolapse. Specific Risks of Malabsorptive Surgeries In addition to the risks of restrictive surgeries, malabsorptive surgeries also carry greater risk for nutritional deficiencies. This is because the procedure causes food to bypass the duodenum and jejunum. That is where most iron and calcium are absorbed. The more extensive the bypass, the greater the risk is for complications and nutritional deficiencies, including:    Anemia.  Osteoporosis.   Metabolic bone disease. Follow-up surgeries to correct complications are needed in about 10-20% of patients.  FOR MORE INFORMATION American Society for Metabolic & Bariatric Surgery: www.asmbs.org  Weight-control Information Network (WIN): win.AmenCredit.is Document Released:  01/29/2005 Document Revised: 02/03/2013 Document Reviewed: 07/29/2012 [facility] Patient Information 2015 [place], [place]. This information is not intended to replace advice given to you by your health care provider. Make sure you discuss any questions you have with your health care provider. Laparoscopic Gastric Band Surgery This surgery is done to help you lose weight.  BEFORE THE SURGERY  Do not gain any more weight once you know you will be having this surgery.  Arrange for someone to take you home from the hospital.  The day before the surgery:  Eat small liquid meals such as broth.  Use half of the surgical scrub, if given, to shower or bathe with. Do not use the surgical scrub to wash your hair. Use regular shampoo.  The day of the surgery:  Shower or bathe using the second half of the surgical scrub.  Arrive at your appointment time.  You will change into a hospital gown.  A tube (IV) will be put in your vein. SURGERY A band is put around the upper part of your stomach. This makes a small pouch which can hold only a small amount of food. The lower, bigger part of your stomach is below the band. The 2 parts stay connected by a small opening between the upper and the lower parts. Food goes through the opening to the lower  part of your stomach more slowly than before the surgery. You will feel more full with smaller amounts of food. On the inner lining of the band around your stomach is a balloon. The balloon is empty during the surgery. Later, at an office visit, it is filled with fluid. Your doctor puts the fluid in through a tube (port) that is right under the skin of your belly. AFTER THE SURGERY  You will go to the recovery room.  You may be given pain medicine.  You may be asked to walk once you are stable.  You may have shoulder pain caused by the gas.  By the time you go home, try to walk for 35 minutes every day.  You will be shown how to use a small breathing  machine (incentive spirometer). This will help you take deep breaths. You need to use this machine several times a day while you are in the hospital and after you go home.  You will need to take another test. For this test, you will swallow a liquid that will show up on X-ray. You will have X-rays taken while you are in different positions. These X-rays will show:  If your new stomach has any leaks.  How well your new stomach holds liquids.  How the liquid moves down to your gut.  Try to not throw up (vomit). Tell your doctor if you feel sick to your stomach (nauseous). There is medicine to help keep you from throwing up.  Your diet will begin with drinking liquids (jello, tea, juice and broth) in small amounts.  Your doctor will decide when you are ready to drink or eat more. Document Released: 03/03/2010 Document Revised: 05/26/2012 Document Reviewed: 03/03/2010 [facility] Patient Information 2015 [place], [place]. This information is not intended to replace advice given to you by your health care provider. Make sure you discuss any questions you have with your health care provider. Health Maintenance A healthy lifestyle and preventative care can promote health and wellness.  Maintain regular health, dental, and eye exams.  Eat a healthy diet. Foods like vegetables, fruits, whole grains, low-fat dairy products, and lean protein foods contain the nutrients you need and are low in calories. Decrease your intake of foods high in solid fats, added sugars, and salt. Get information about a proper diet from your health care provider, if necessary.  Regular physical exercise is one of the most important things you can do for your health. Most adults should get at least 150 minutes of moderate-intensity exercise (any activity that increases your heart rate and causes you to sweat) each week. In addition, most adults need muscle-strengthening exercises on 2 or more days a week.   Maintain a healthy  weight. The body mass index (BMI) is a screening tool to identify possible weight problems. It provides an estimate of body fat based on height and weight. Your health care provider can find your BMI and can help you achieve or maintain a healthy weight. For males 20 years and older:  A BMI below 18.5 is considered underweight.  A BMI of 18.5 to 24.9 is normal.  A BMI of 25 to 29.9 is considered overweight.  A BMI of 30 and above is considered obese.  Maintain normal blood lipids and cholesterol by exercising and minimizing your intake of saturated fat. Eat a balanced diet with plenty of fruits and vegetables. Blood tests for lipids and cholesterol should begin at age 22 and be repeated every 5 years. If your  lipid or cholesterol levels are high, you are over age 91, or you are at high risk for heart disease, you may need your cholesterol levels checked more frequently.Ongoing high lipid and cholesterol levels should be treated with medicines if diet and exercise are not working.  If you smoke, find out from your health care provider how to quit. If you do not use tobacco, do not start.  Lung cancer screening is recommended for adults aged 33-80 years who are at high risk for developing lung cancer because of a history of smoking. A yearly low-dose CT scan of the lungs is recommended for people who have at least a 30-pack-year history of smoking and are current smokers or have quit within the past 15 years. A pack year of smoking is smoking an average of 1 pack of cigarettes a day for 1 year (for example, a 30-pack-year history of smoking could mean smoking 1 pack a day for 30 years or 2 packs a day for 15 years). Yearly screening should continue until the smoker has stopped smoking for at least 15 years. Yearly screening should be stopped for people who develop a health problem that would prevent them from having lung cancer treatment.  If you choose to drink alcohol, do not have more than 2 drinks  per day. One drink is considered to be 12 oz (360 mL) of beer, 5 oz (150 mL) of wine, or 1.5 oz (45 mL) of liquor.  Avoid the use of street drugs. Do not share needles with anyone. Ask for help if you need support or instructions about stopping the use of drugs.  High blood pressure causes heart disease and increases the risk of stroke. Blood pressure should be checked at least every 1-2 years. Ongoing high blood pressure should be treated with medicines if weight loss and exercise are not effective.  If you are 79-60 years old, ask your health care provider if you should take aspirin to prevent heart disease.  Diabetes screening involves taking a blood sample to check your fasting blood sugar level. This should be done once every 3 years after age 10 if you are at a normal weight and without risk factors for diabetes. Testing should be considered at a younger age or be carried out more frequently if you are overweight and have at least 1 risk factor for diabetes.  Colorectal cancer can be detected and often prevented. Most routine colorectal cancer screening begins at the age of 4 and continues through age 6. However, your health care provider may recommend screening at an earlier age if you have risk factors for colon cancer. On a yearly basis, your health care provider may provide home test kits to check for hidden blood in the stool. A small camera at the end of a tube may be used to directly examine the colon (sigmoidoscopy or colonoscopy) to detect the earliest forms of colorectal cancer. Talk to your health care provider about this at age 79 when routine screening begins. A direct exam of the colon should be repeated every 5-10 years through age 82, unless early forms of precancerous polyps or small growths are found.  People who are at an increased risk for hepatitis B should be screened for this virus. You are considered at high risk for hepatitis B if:  You were born in a country where  hepatitis B occurs often. Talk with your health care provider about which countries are considered high risk.  Your parents were born in a  high-risk country and you have not received a shot to protect against hepatitis B (hepatitis B vaccine).  You have HIV or AIDS.  You use needles to inject street drugs.  You live with, or have sex with, someone who has hepatitis B.  You are a man who has sex with other men (MSM).  You get hemodialysis treatment.  You take certain medicines for conditions like cancer, organ transplantation, and autoimmune conditions.  Hepatitis C blood testing is recommended for all people born from [birth year] through 1966 and any individual with known risk factors for hepatitis C.  Healthy men should no longer receive prostate-specific antigen (PSA) blood tests as part of routine cancer screening. Talk to your health care provider about prostate cancer screening.  Testicular cancer screening is not recommended for adolescents or adult males who have no symptoms. Screening includes self-exam, a health care provider exam, and other screening tests. Consult with your health care provider about any symptoms you have or any concerns you have about testicular cancer.  Practice safe sex. Use condoms and avoid high-risk sexual practices to reduce the spread of sexually transmitted infections (STIs).  You should be screened for STIs, including gonorrhea and chlamydia if:  You are sexually active and are younger than 24 years.  You are older than 24 years, and your health care provider tells you that you are at risk for this type of infection.  Your sexual activity has changed since you were last screened, and you are at an increased risk for chlamydia or gonorrhea. Ask your health care provider if you are at risk.  If you are at risk of being infected with HIV, it is recommended that you take a prescription medicine daily to prevent HIV infection. This is called pre-exposure  prophylaxis (PrEP). You are considered at risk if:  You are a man who has sex with other men (MSM).  You are a heterosexual man who is sexually active with multiple partners.  You take drugs by injection.  You are sexually active with a partner who has HIV.  Talk with your health care provider about whether you are at high risk of being infected with HIV. If you choose to begin PrEP, you should first be tested for HIV. You should then be tested every 3 months for as long as you are taking PrEP.  Use sunscreen. Apply sunscreen liberally and repeatedly throughout the day. You should seek shade when your shadow is shorter than you. Protect yourself by wearing long sleeves, pants, a wide-brimmed hat, and sunglasses year round whenever you are outdoors.  Tell your health care provider of new moles or changes in moles, especially if there is a change in shape or color. Also, tell your health care provider if a mole is larger than the size of a pencil eraser.  A one-time screening for abdominal aortic aneurysm (AAA) and surgical repair of large AAAs by ultrasound is recommended for men aged 9-75 years who are current or former smokers.  Stay current with your vaccines (immunizations). Document Released: 07/28/2007 Document Revised: 02/03/2013 Document Reviewed: 06/26/2010 [facility] Patient Information 2015 [place], [place]. This information is not intended to replace advice given to you by your health care provider. Make sure you discuss any questions you have with your health care provider.

## 2013-12-31 NOTE — Progress Notes (Signed)
Pre visit review using our clinic review tool, if applicable. No additional management support is needed unless otherwise documented below in the visit note. 

## 2014-02-24 ENCOUNTER — Encounter: Payer: Self-pay | Admitting: Family Medicine

## 2014-02-24 ENCOUNTER — Ambulatory Visit (INDEPENDENT_AMBULATORY_CARE_PROVIDER_SITE_OTHER): Payer: PRIVATE HEALTH INSURANCE | Admitting: Family Medicine

## 2014-02-24 VITALS — BP 160/98 | HR 89 | Temp 98.1°F | Ht 73.0 in | Wt 397.1 lb

## 2014-02-24 DIAGNOSIS — J069 Acute upper respiratory infection, unspecified: Secondary | ICD-10-CM

## 2014-02-24 MED ORDER — HYDROCODONE-HOMATROPINE 5-1.5 MG/5ML PO SYRP: 5.0000 mL | ORAL_SOLUTION | Freq: Three times a day (TID) | ORAL | Status: DC | PRN

## 2014-02-24 NOTE — Patient Instructions (Signed)

## 2014-02-24 NOTE — Progress Notes (Signed)
HPI:  -started: 1,5-2 week go -symptoms:nasal congestion,PND, cough, sneezing and watery eyes initially -denies:fever, SOB, NVD, sinus pain -has tried: musinex -sick contacts/travel/risks: denies flu exposure, tick exposure or or Ebola risks -Hx of: smoking ROS: See pertinent positives and negatives per HPI.  Past Medical History  Diagnosis Date  . ERECTILE DYSFUNCTION 03/03/2007  . HYPERTENSION NEC 03/03/2007  . HYPERTENSION 07/03/2007  . Obesities, morbid   . Sleep apnea 09/2012    does not use Cpap  . Anxiety     during a divorce  . Kidney stone   . GERD (gastroesophageal reflux disease)     not taking meds  . Headache     headaches  . Dry skin   . Tear of medial meniscus of right knee 11/20/2013    Past Surgical History  Procedure Laterality Date  . Vocal cord abscess surgery    . Knee arthroscopy Right 11/20/2013    Procedure: RIGHT KNEE ARTHROSCOPY WITH MEDIAL MENISCECTOMY ;  Surgeon: Johnny Bridge, MD;  Location: [facility];  Service: Orthopedics;  Laterality: Right;    Family History  Problem Relation Age of Onset  . Hypertension Mother   . Arthritis Mother   . Diabetes Mother   . Diabetes Sister   . Diabetes Sister   . Hypertension Sister   . Arrhythmia Sister     has pacemaker    History   Social History  . Marital Status: Divorced    Spouse Name: N/A    Number of Children: 2  . Years of Education: N/A   Occupational History  .  [employer]   Social History Main Topics  . Smoking status: Current Every Day Smoker -- 0.50 packs/day for 15 years    Types: Cigarettes  . Smokeless tobacco: Never Used  . Alcohol Use: Yes     Comment: occasional  . Drug Use: No  . Sexual Activity: None   Other Topics Concern  . None   Social History Narrative     Current outpatient prescriptions:  .  [facility] SENNA PLUS 8.6-50 MG per tablet, Take 2 tablets by mouth as needed. , Disp: , Rfl: 1 .  ibuprofen (ADVIL,MOTRIN) 200 MG tablet, Take 200 mg by mouth  every 6 (six) hours as needed for mild pain., Disp: , Rfl:  .  lisinopril-hydrochlorothiazide (PRINZIDE,ZESTORETIC) 20-25 MG per tablet, Take 1 tablet by mouth daily., Disp: 90 tablet, Rfl: 3 .  HYDROcodone-homatropine (HYCODAN) 5-1.5 MG/5ML syrup, Take 5 mLs by mouth every 8 (eight) hours as needed for cough., Disp: 120 mL, Rfl: 0  EXAM:  Filed Vitals:   02/24/14 0930  BP: 160/98  Pulse: 89  Temp: 98.1 F (36.7 C)    Body mass index is 52.4 kg/(m^2).  GENERAL: vitals reviewed and listed above, alert, oriented, appears well hydrated and in no acute distress  HEENT: atraumatic, conjunttiva clear, no obvious abnormalities on inspection of external nose and ears, normal appearance of ear canals and TMs, clear nasal congestion, mild post oropharyngeal erythema with PND, no tonsillar edema or exudate, no sinus TTP  NECK: no obvious masses on inspection  LUNGS: clear to auscultation bilaterally, no wheezes, rales or rhonchi, good air movement  CV: HRRR, no peripheral edema  MS: moves all extremities without noticeable abnormality  PSYCH: pleasant and cooperative, no obvious depression or anxiety  ASSESSMENT AND PLAN:  Discussed the following assessment and plan:  Acute upper respiratory infection  -given HPI and exam findings today, a serious infection or illness  is unlikely. We discussed potential etiologies, with VURI being most likely, and advised supportive care and monitoring. We discussed treatment side effects, likely course, antibiotic misuse, transmission, and signs of developing a serious illness. -advised of risks and proper use of cough medication  -of course, we advised to return or notify a doctor immediately if symptoms worsen or persist or new concerns arise.    Patient Instructions  INSTRUCTIONS FOR UPPER RESPIRATORY INFECTION:  -plenty of rest and fluids  -nasal saline wash 2-3 times daily (use prepackaged nasal saline or bottled/distilled water if making  your own)   -can use AFRIN nasal spray for drainage and nasal congestion - but do NOT use longer then 3-4 days  -can use tylenol or ibuprofen as directed for aches and sorethroat  -in the winter time, using a humidifier at night is helpful (please follow cleaning instructions)  -if you are taking a cough medication - use only as directed, may also try a teaspoon of honey to coat the throat and throat lozenges  -for sore throat, salt water gargles can help  -follow up if you have fevers, facial pain, tooth pain, difficulty breathing or are worsening or not getting better in 5-7 days      Marshea Wisher R.

## 2014-02-24 NOTE — Progress Notes (Signed)
Pre visit review using our clinic review tool, if applicable. No additional management support is needed unless otherwise documented below in the visit note. 

## 2014-05-10 ENCOUNTER — Ambulatory Visit (HOSPITAL_COMMUNITY): Payer: PRIVATE HEALTH INSURANCE | Attending: Internal Medicine | Admitting: Cardiology

## 2014-05-10 ENCOUNTER — Encounter: Payer: Self-pay | Admitting: Internal Medicine

## 2014-05-10 ENCOUNTER — Ambulatory Visit (INDEPENDENT_AMBULATORY_CARE_PROVIDER_SITE_OTHER): Payer: PRIVATE HEALTH INSURANCE | Admitting: Internal Medicine

## 2014-05-10 VITALS — BP 150/90 | HR 92 | Temp 98.1°F | Resp 20 | Ht 73.0 in | Wt >= 6400 oz

## 2014-05-10 DIAGNOSIS — G4733 Obstructive sleep apnea (adult) (pediatric): Secondary | ICD-10-CM

## 2014-05-10 DIAGNOSIS — M7989 Other specified soft tissue disorders: Secondary | ICD-10-CM

## 2014-05-10 DIAGNOSIS — I10 Essential (primary) hypertension: Secondary | ICD-10-CM | POA: Diagnosis not present

## 2014-05-10 DIAGNOSIS — F329 Major depressive disorder, single episode, unspecified: Secondary | ICD-10-CM

## 2014-05-10 DIAGNOSIS — F32A Depression, unspecified: Secondary | ICD-10-CM

## 2014-05-10 MED ORDER — BUPROPION HCL ER (XL) 150 MG PO TB24: 150.0000 mg | ORAL_TABLET | Freq: Every day | ORAL | Status: DC

## 2014-05-10 MED ORDER — BACLOFEN 10 MG PO TABS: 10.0000 mg | ORAL_TABLET | Freq: Three times a day (TID) | ORAL | Status: DC

## 2014-05-10 NOTE — Patient Instructions (Signed)
Venous Doppler study as discussed  Return in 2 months for follow-up  Limit your sodium (Salt) intake  Please check your blood pressure on a regular basis.  If it is consistently greater than 150/90, please make an office appointment.

## 2014-05-10 NOTE — Progress Notes (Signed)
Left lower venous duplex performed  

## 2014-05-10 NOTE — Progress Notes (Signed)
Subjective:    Patient ID: Justin Norman, male    DOB: 31-Dec-1970, 44 y.o.   MRN: [id number]  HPI  Wt Readings from Last 3 Encounters:  05/10/14 403 lb 8 oz (183.026 kg)  02/24/14 397 lb 1.6 oz (180.123 kg)  12/31/13 385 lb (174.35 kg)   44 year old patient who has a history of morbid obesity and essential hypertension.  He presents today with a number of concerns.  He has had some intermittent back spasms.  He has been evaluated by orthopedics and has benefited from baclofen in the past.  He is requesting a refill. He has essential hypertension. He has morbid obesity.  Weight continues to increase.  Now at 403.8 pounds Additional problems include insomnia and situational stress.  Stressors include the loss of his mother recent orthopedic surgery and change in his job status. For the past several days she has had increasing swelling involving his left leg.  He has had some right arthroscopic knee surgery.  No pulmonary complaints.  No history of prior DVT.  He has documented OSA but states has not been able to afford CPAP equipment.  He has been asked to check with his insurance company to confirm coverage  Past Medical History  Diagnosis Date  . ERECTILE DYSFUNCTION 03/03/2007  . HYPERTENSION NEC 03/03/2007  . HYPERTENSION 07/03/2007  . Obesities, morbid   . Sleep apnea 09/2012    does not use Cpap  . Anxiety     during a divorce  . Kidney stone   . GERD (gastroesophageal reflux disease)     not taking meds  . Headache     headaches  . Dry skin   . Tear of medial meniscus of right knee 11/20/2013    History   Social History  . Marital Status: Divorced    Spouse Name: N/A  . Number of Children: 2  . Years of Education: N/A   Occupational History  .  [employer]   Social History Main Topics  . Smoking status: Current Every Day Smoker -- 0.50 packs/day for 15 years    Types: Cigarettes  . Smokeless tobacco: Never Used  . Alcohol Use: Yes     Comment:  occasional  . Drug Use: No  . Sexual Activity: Not on file   Other Topics Concern  . Not on file   Social History Narrative    Past Surgical History  Procedure Laterality Date  . Vocal cord abscess surgery    . Knee arthroscopy Right 11/20/2013    Procedure: RIGHT KNEE ARTHROSCOPY WITH MEDIAL MENISCECTOMY ;  Surgeon: Johnny Bridge, MD;  Location: [facility];  Service: Orthopedics;  Laterality: Right;    Family History  Problem Relation Age of Onset  . Hypertension Mother   . Arthritis Mother   . Diabetes Mother   . Diabetes Sister   . Diabetes Sister   . Hypertension Sister   . Arrhythmia Sister     has pacemaker    No Known Allergies  Current Outpatient Prescriptions on File Prior to Visit  Medication Sig Dispense Refill  . [facility] SENNA PLUS 8.6-50 MG per tablet Take 2 tablets by mouth as needed.   1  . ibuprofen (ADVIL,MOTRIN) 200 MG tablet Take 200 mg by mouth every 6 (six) hours as needed for mild pain.    [name] lisinopril-hydrochlorothiazide (PRINZIDE,ZESTORETIC) 20-25 MG per tablet Take 1 tablet by mouth daily. 90 tablet 3   No current facility-administered medications on file prior to visit.  BP 150/90 mmHg  Pulse 92  Temp(Src) 98.1 F (36.7 C) (Oral)  Resp 20  Ht 6\' 1"  (1.854 m)  Wt 403 lb 8 oz (183.026 kg)  BMI 53.25 kg/m2  SpO2 97%      Review of Systems  Constitutional: Negative for fever, chills, appetite change and fatigue.  HENT: Negative for congestion, dental problem, ear pain, hearing loss, sore throat, tinnitus, trouble swallowing and voice change.   Eyes: Negative for pain, discharge and visual disturbance.  Respiratory: Negative for cough, chest tightness, wheezing and stridor.   Cardiovascular: Positive for leg swelling. Negative for chest pain and palpitations.  Gastrointestinal: Negative for nausea, vomiting, abdominal pain, diarrhea, constipation, blood in stool and abdominal distention.  Genitourinary: Negative for urgency, hematuria, flank  pain, discharge, difficulty urinating and genital sores.  Musculoskeletal: Positive for back pain. Negative for myalgias, joint swelling, arthralgias, gait problem and neck stiffness.  Skin: Negative for rash.  Neurological: Negative for dizziness, syncope, speech difficulty, weakness, numbness and headaches.  Hematological: Negative for adenopathy. Does not bruise/bleed easily.  Psychiatric/Behavioral: Positive for sleep disturbance and dysphoric mood. Negative for behavioral problems. The patient is nervous/anxious.        Objective:   Physical Exam  Constitutional: He is oriented to person, place, and time. He appears well-developed.  Morbid obesity  Repeat blood pressure still 150 over 90  HENT:  Head: Normocephalic.  Right Ear: External ear normal.  Left Ear: External ear normal.  Eyes: Conjunctivae and EOM are normal.  Neck: Normal range of motion.  Cardiovascular: Normal rate and normal heart sounds.   Pulmonary/Chest: Breath sounds normal.  Abdominal: Bowel sounds are normal.  Musculoskeletal: Normal range of motion. He exhibits edema. He exhibits no tenderness.  Left calf swelling without significant tenderness  Neurological: He is alert and oriented to person, place, and time.  Psychiatric: He has a normal mood and affect. His behavior is normal.          Assessment & Plan:   Essential hypertension.  Weight loss.  Restricted salt diet all encouraged.  Home blood pressure monitoring.  Encouraged.  Recheck blood pressure 1 month Low back pain.  Baclofen refilled  Morbid obesity Left leg swelling.  Rule out DVT.  Will set up for prompt the venous Doppler evaluation Clinical depression.  Will give a trial of Wellbutrin;  reassess in 6-8 weeks

## 2014-05-10 NOTE — Progress Notes (Signed)
Pre visit review using our clinic review tool, if applicable. No additional management support is needed unless otherwise documented below in the visit note. 

## 2014-05-11 ENCOUNTER — Telehealth: Payer: Self-pay | Admitting: *Deleted

## 2014-05-11 MED ORDER — LISINOPRIL-HYDROCHLOROTHIAZIDE 20-25 MG PO TABS: 1.0000 | ORAL_TABLET | Freq: Every day | ORAL | Status: DC

## 2014-05-11 NOTE — Telephone Encounter (Signed)
-----   Message from Marletta Lor, MD sent at 05/10/2014  5:34 PM EDT ----- Regarding: FW: prelim Notify patient that his venous Doppler study was normal for blood clots ----- Message -----    From: Illene Silver    Sent: 05/10/2014   2:41 PM      To: Marletta Lor, MD Subject: prelim                                         Left Lower venous duplex appears negative for Acute thrombus.  Thank you

## 2014-05-11 NOTE — Telephone Encounter (Signed)
Pt called back told him venous doppler study was normal, no blood clots. Pt verbalized understanding.

## 2014-05-11 NOTE — Telephone Encounter (Signed)
Left message to call office with family member.

## 2014-05-26 ENCOUNTER — Telehealth: Payer: Self-pay | Admitting: Internal Medicine

## 2014-05-26 NOTE — Telephone Encounter (Signed)
Pt said he used to samples of Cialis from Dr Raliegh Ip and he is asking for a rx of Cialis     [facility]; [facility] [address]

## 2014-05-27 MED ORDER — TADALAFIL 10 MG PO TABS
10.0000 mg | ORAL_TABLET | Freq: Every day | ORAL | Status: DC | PRN
Start: 2014-05-27 — End: 2014-07-09

## 2014-05-27 NOTE — Telephone Encounter (Signed)
Left message to call office.Need dosage of Cialis that Dr. Raliegh Ip gave him?

## 2014-05-27 NOTE — Telephone Encounter (Signed)
Pt called back said he was given Cialis 10 mg tablets. Told pt okay will send Rx to pharmacy. Pt verbalized understanding.

## 2014-05-31 ENCOUNTER — Ambulatory Visit (INDEPENDENT_AMBULATORY_CARE_PROVIDER_SITE_OTHER): Payer: PRIVATE HEALTH INSURANCE | Admitting: Family Medicine

## 2014-05-31 ENCOUNTER — Encounter: Payer: Self-pay | Admitting: Family Medicine

## 2014-05-31 ENCOUNTER — Telehealth: Payer: Self-pay | Admitting: Internal Medicine

## 2014-05-31 VITALS — BP 138/90 | HR 96 | Temp 98.8°F | Ht 73.0 in | Wt >= 6400 oz

## 2014-05-31 DIAGNOSIS — R600 Localized edema: Secondary | ICD-10-CM | POA: Diagnosis not present

## 2014-05-31 DIAGNOSIS — R739 Hyperglycemia, unspecified: Secondary | ICD-10-CM | POA: Diagnosis not present

## 2014-05-31 LAB — GLUCOSE, POCT (MANUAL RESULT ENTRY): POC Glucose: 87 mg/dl (ref 70–99)

## 2014-05-31 NOTE — Telephone Encounter (Signed)
Spoke to pt, told him will need to come into the office to be evaluated for swelling in legs. Will have to see another provider due to Dr.K is out of the office. Told him I will have the schedulers call him back with an appt. Pt verbalized understanding. Norma notified to call pt and schedule.

## 2014-05-31 NOTE — Progress Notes (Signed)
HPI:  LE edema: -chronic lower extremity edema - he reports he has had had LE edema for a long time - reports has been for over 1.5 years, intermittently worse, reports chronic skin changes for this -reports he mentioned this at his last visit with his PCP and to his PCP in the past -reports had neg DVT screen for some swelling -wants to know what he can do with this - reports he wants to know what to do about this -denies: CP, SOB, DOE -he wants to check his FBS today - reports his PCP does this for him from time to time and told him he may have borderline diabetes  ROS: See pertinent positives and negatives per HPI.  Past Medical History  Diagnosis Date  . ERECTILE DYSFUNCTION 03/03/2007  . HYPERTENSION NEC 03/03/2007  . HYPERTENSION 07/03/2007  . Obesities, morbid   . Sleep apnea 09/2012    does not use Cpap  . Anxiety     during a divorce  . Kidney stone   . GERD (gastroesophageal reflux disease)     not taking meds  . Headache     headaches  . Dry skin   . Tear of medial meniscus of right knee 11/20/2013    Past Surgical History  Procedure Laterality Date  . Vocal cord abscess surgery    . Knee arthroscopy Right 11/20/2013    Procedure: RIGHT KNEE ARTHROSCOPY WITH MEDIAL MENISCECTOMY ;  Surgeon: Johnny Bridge, MD;  Location: [facility];  Service: Orthopedics;  Laterality: Right;    Family History  Problem Relation Age of Onset  . Hypertension Mother   . Arthritis Mother   . Diabetes Mother   . Diabetes Sister   . Diabetes Sister   . Hypertension Sister   . Arrhythmia Sister     has pacemaker    History   Social History  . Marital Status: Divorced    Spouse Name: N/A  . Number of Children: 2  . Years of Education: N/A   Occupational History  .  [employer]   Social History Main Topics  . Smoking status: Current Every Day Smoker -- 0.50 packs/day for 15 years    Types: Cigarettes  . Smokeless tobacco: Never Used  . Alcohol Use: Yes     Comment:  occasional  . Drug Use: No  . Sexual Activity: Not on file   Other Topics Concern  . None   Social History Narrative     Current outpatient prescriptions:  .  baclofen (LIORESAL) 10 MG tablet, Take 1 tablet (10 mg total) by mouth 3 (three) times daily., Disp: 30 each, Rfl: 4 .  ibuprofen (ADVIL,MOTRIN) 200 MG tablet, Take 200 mg by mouth every 6 (six) hours as needed for mild pain., Disp: , Rfl:  .  lisinopril-hydrochlorothiazide (PRINZIDE,ZESTORETIC) 20-25 MG per tablet, Take 1 tablet by mouth daily., Disp: 90 tablet, Rfl: 3 .  tadalafil (CIALIS) 10 MG tablet, Take 1 tablet (10 mg total) by mouth daily as needed for erectile dysfunction., Disp: 10 tablet, Rfl: 1  EXAM:  Filed Vitals:   05/31/14 1554  BP: 138/90  Pulse: 96  Temp: 98.8 F (37.1 C)    Body mass index is 53.35 kg/(m^2).  GENERAL: vitals reviewed and listed above, alert, oriented, appears well hydrated and in no acute distress  HEENT: atraumatic, conjunttiva clear, no obvious abnormalities on inspection of external nose and ears  NECK: no obvious masses on inspection  LUNGS: clear to auscultation bilaterally, no  wheezes, rales or rhonchi, good air movement  CV: HRRR, bilat LE edema to lower 1/3 calf and ankles  MS: moves all extremities without noticeable abnormality  PSYCH: pleasant and cooperative, no obvious depression or anxiety  ASSESSMENT AND PLAN:  Discussed the following assessment and plan:  Bilateral leg edema  -chronic, likely multifactorial with venous insufficiency, sedantary lifestyle, poor diet, obesity contributing, discussed other causes -opted to start with compression and elevation and follow up with PCP  -he wants to check FSBS - will have assistant check this before he leaves -Patient advised to return or notify a doctor immediately if symptoms worsen or persist or new concerns arise.  There are no Patient Instructions on file for this visit.   Colin Benton R.

## 2014-05-31 NOTE — Progress Notes (Signed)
Pre visit review using our clinic review tool, if applicable. No additional management support is needed unless otherwise documented below in the visit note. 

## 2014-05-31 NOTE — Telephone Encounter (Signed)
Pt has been sch

## 2014-05-31 NOTE — Patient Instructions (Signed)
BEFORE YOU LEAVE: -Schedule follow up with your doctor in 1-2 months  Compression socks daily elevation of legs as we discussed  Health diet and regular exercise

## 2014-05-31 NOTE — Addendum Note (Signed)
Addended by: Agnes Lawrence on: 05/31/2014 04:31 PM   Modules accepted: Orders

## 2014-05-31 NOTE — Telephone Encounter (Signed)
Pt was send on 05-10-14 for left leg swelling and had doppler no blood clot. Pt  Now has left and right  leg swelling. Pt would like to know the next step. Please is aware md out of office. Pt decline appt

## 2014-06-03 ENCOUNTER — Telehealth: Payer: Self-pay | Admitting: Pulmonary Disease

## 2014-06-03 NOTE — Telephone Encounter (Signed)
Spoke with pt. Advised him that we would not able to give a prescription for a new CPAP. He has not been seen since 2014. ROV has been schedule with RA on 07/09/14 at 10am. Nothing further was needed.

## 2014-07-09 ENCOUNTER — Encounter: Payer: Self-pay | Admitting: Pulmonary Disease

## 2014-07-09 ENCOUNTER — Ambulatory Visit (INDEPENDENT_AMBULATORY_CARE_PROVIDER_SITE_OTHER): Payer: 59 | Admitting: Internal Medicine

## 2014-07-09 ENCOUNTER — Other Ambulatory Visit: Payer: Self-pay | Admitting: *Deleted

## 2014-07-09 ENCOUNTER — Encounter: Payer: Self-pay | Admitting: Internal Medicine

## 2014-07-09 ENCOUNTER — Ambulatory Visit (INDEPENDENT_AMBULATORY_CARE_PROVIDER_SITE_OTHER): Payer: 59 | Admitting: Pulmonary Disease

## 2014-07-09 VITALS — BP 130/86 | HR 101 | Ht 73.0 in | Wt >= 6400 oz

## 2014-07-09 VITALS — BP 150/84 | HR 92 | Temp 98.2°F | Resp 20 | Ht 73.0 in | Wt >= 6400 oz

## 2014-07-09 DIAGNOSIS — Z0000 Encounter for general adult medical examination without abnormal findings: Secondary | ICD-10-CM

## 2014-07-09 DIAGNOSIS — M160 Bilateral primary osteoarthritis of hip: Secondary | ICD-10-CM | POA: Insufficient documentation

## 2014-07-09 DIAGNOSIS — G4733 Obstructive sleep apnea (adult) (pediatric): Secondary | ICD-10-CM

## 2014-07-09 DIAGNOSIS — E349 Endocrine disorder, unspecified: Secondary | ICD-10-CM

## 2014-07-09 MED ORDER — FUROSEMIDE 40 MG PO TABS: 40.0000 mg | ORAL_TABLET | Freq: Every day | ORAL | Status: DC

## 2014-07-09 MED ORDER — SYRINGE (DISPOSABLE) 3 ML MISC: Status: DC

## 2014-07-09 MED ORDER — "NEEDLE (DISP) 23G X 1"" MISC": Status: DC

## 2014-07-09 MED ORDER — "NEEDLE (DISP) 20G X 1"" MISC": Status: DC

## 2014-07-09 MED ORDER — TESTOSTERONE CYPIONATE 200 MG/ML IM SOLN: 200.0000 mg | INTRAMUSCULAR | Status: DC

## 2014-07-09 NOTE — Patient Instructions (Addendum)
Limit your sodium (Salt) intake  Please check your blood pressure on a regular basis.  If it is consistently greater than 150/90, please make an office appointment.  You need to lose weight.  Consider a lower calorie diet and regular exercise.  Start CPAP when available  Low-Sodium Eating Plan Sodium raises blood pressure and causes water to be held in the body. Getting less sodium from food will help lower your blood pressure, reduce any swelling, and protect your heart, liver, and kidneys. We get sodium by adding salt (sodium chloride) to food. Most of our sodium comes from canned, boxed, and frozen foods. Restaurant foods, fast foods, and pizza are also very high in sodium. Even if you take medicine to lower your blood pressure or to reduce fluid in your body, getting less sodium from your food is important. WHAT IS MY PLAN? Most people should limit their sodium intake to 2,300 mg a day. Your health care provider recommends that you limit your sodium intake to __________ a day.  WHAT DO I NEED TO KNOW ABOUT THIS EATING PLAN? For the low-sodium eating plan, you will follow these general guidelines:  Choose foods with a % Daily Value for sodium of less than 5% (as listed on the food label).   Use salt-free seasonings or herbs instead of table salt or sea salt.   Check with your health care provider or pharmacist before using salt substitutes.   Eat fresh foods.  Eat more vegetables and fruits.  Limit canned vegetables. If you do use them, rinse them well to decrease the sodium.   Limit cheese to 1 oz (28 g) per day.   Eat lower-sodium products, often labeled as "lower sodium" or "no salt added."  Avoid foods that contain monosodium glutamate (MSG). MSG is sometimes added to [place] food and some canned foods.  Check food labels (Nutrition Facts labels) on foods to learn how much sodium is in one serving.  Eat more home-cooked food and less restaurant, buffet, and fast  food.  When eating at a restaurant, ask that your food be prepared with less salt or none, if possible.  HOW DO I READ FOOD LABELS FOR SODIUM INFORMATION? The Nutrition Facts label lists the amount of sodium in one serving of the food. If you eat more than one serving, you must multiply the listed amount of sodium by the number of servings. Food labels may also identify foods as:  Sodium free--Less than 5 mg in a serving.  Very low sodium--35 mg or less in a serving.  Low sodium--140 mg or less in a serving.  Light in sodium--50% less sodium in a serving. For example, if a food that usually has 300 mg of sodium is changed to become light in sodium, it will have 150 mg of sodium.  Reduced sodium--25% less sodium in a serving. For example, if a food that usually has 400 mg of sodium is changed to reduced sodium, it will have 300 mg of sodium. WHAT FOODS CAN I EAT? Grains Low-sodium cereals, including oats, puffed wheat and rice, and shredded wheat cereals. Low-sodium crackers. Unsalted rice and pasta. Lower-sodium bread.  Vegetables Frozen or fresh vegetables. Low-sodium or reduced-sodium canned vegetables. Low-sodium or reduced-sodium tomato sauce and paste. Low-sodium or reduced-sodium tomato and vegetable juices.  Fruits Fresh, frozen, and canned fruit. Fruit juice.  Meat and Other Protein Products Low-sodium canned tuna and salmon. Fresh or frozen meat, poultry, seafood, and fish. Lamb. Unsalted nuts. Dried beans, peas, and lentils  without added salt. Unsalted canned beans. Homemade soups without salt. Eggs.  Dairy Milk. Soy milk. Ricotta cheese. Low-sodium or reduced-sodium cheeses. Yogurt.  Condiments Fresh and dried herbs and spices. Salt-free seasonings. Onion and garlic powders. Low-sodium varieties of mustard and ketchup. Lemon juice.  Fats and Oils Reduced-sodium salad dressings. Unsalted butter.  Other Unsalted popcorn and pretzels.  The items listed above may  not be a complete list of recommended foods or beverages. Contact your dietitian for more options. WHAT FOODS ARE NOT RECOMMENDED? Grains Instant hot cereals. Bread stuffing, pancake, and biscuit mixes. Croutons. Seasoned rice or pasta mixes. Noodle soup cups. Boxed or frozen macaroni and cheese. Self-rising flour. Regular salted crackers. Vegetables Regular canned vegetables. Regular canned tomato sauce and paste. Regular tomato and vegetable juices. Frozen vegetables in sauces. Salted french fries. Olives. Angie Fava. Relishes. Sauerkraut. Salsa. Meat and Other Protein Products Salted, canned, smoked, spiced, or pickled meats, seafood, or fish. Bacon, ham, sausage, hot dogs, corned beef, chipped beef, and packaged luncheon meats. Salt pork. Jerky. Pickled herring. Anchovies, regular canned tuna, and sardines. Salted nuts. Dairy Processed cheese and cheese spreads. Cheese curds. Blue cheese and cottage cheese. Buttermilk.  Condiments Onion and garlic salt, seasoned salt, table salt, and sea salt. Canned and packaged gravies. Worcestershire sauce. Tartar sauce. Barbecue sauce. Teriyaki sauce. Soy sauce, including reduced sodium. Steak sauce. Fish sauce. Oyster sauce. Cocktail sauce. Horseradish. Regular ketchup and mustard. Meat flavorings and tenderizers. Bouillon cubes. Hot sauce. Tabasco sauce. Marinades. Taco seasonings. Relishes. Fats and Oils Regular salad dressings. Salted butter. Margarine. Ghee. Bacon fat.  Other Potato and tortilla chips. Corn chips and puffs. Salted popcorn and pretzels. Canned or dried soups. Pizza. Frozen entrees and pot pies.  The items listed above may not be a complete list of foods and beverages to avoid. Contact your dietitian for more information. Document Released: 07/21/2001 Document Revised: 02/03/2013 Document Reviewed: 12/03/2012 [facility] Patient Information 2015 [place], [place]. This information is not intended to replace advice given to you by your  health care provider. Make sure you discuss any questions you have with your health care provider.

## 2014-07-09 NOTE — Progress Notes (Signed)
Pre visit review using our clinic review tool, if applicable. No additional management support is needed unless otherwise documented below in the visit note. 

## 2014-07-09 NOTE — Progress Notes (Signed)
Subjective:    Patient ID: Justin Norman, male    DOB: 1970-08-11, 44 y.o.   MRN: [id number]  HPI  44 year old patient who is seen today in follow-up.  He has treated hypertension.  He was seen earlier today by pulmonary medicine and CPAP supplies ordered His chief complaint is lower extremity edema.  This has been helped somewhat by support hose.  No pulmonary complaints  Past Medical History  Diagnosis Date  . ERECTILE DYSFUNCTION 03/03/2007  . HYPERTENSION NEC 03/03/2007  . HYPERTENSION 07/03/2007  . Obesities, morbid   . Sleep apnea 09/2012    does not use Cpap  . Anxiety     during a divorce  . Kidney stone   . GERD (gastroesophageal reflux disease)     not taking meds  . Headache     headaches  . Dry skin   . Tear of medial meniscus of right knee 11/20/2013    History   Social History  . Marital Status: Divorced    Spouse Name: N/A  . Number of Children: 2  . Years of Education: N/A   Occupational History  .  [employer]   Social History Main Topics  . Smoking status: Current Every Day Smoker -- 0.50 packs/day for 15 years    Types: Cigarettes  . Smokeless tobacco: Never Used  . Alcohol Use: Yes     Comment: occasional  . Drug Use: No  . Sexual Activity: Not on file   Other Topics Concern  . Not on file   Social History Narrative    Past Surgical History  Procedure Laterality Date  . Vocal cord abscess surgery    . Knee arthroscopy Right 11/20/2013    Procedure: RIGHT KNEE ARTHROSCOPY WITH MEDIAL MENISCECTOMY ;  Surgeon: Johnny Bridge, MD;  Location: [facility];  Service: Orthopedics;  Laterality: Right;    Family History  Problem Relation Age of Onset  . Hypertension Mother   . Arthritis Mother   . Diabetes Mother   . Diabetes Sister   . Diabetes Sister   . Hypertension Sister   . Arrhythmia Sister     has pacemaker    No Known Allergies  Current Outpatient Prescriptions on File Prior to Visit  Medication Sig Dispense Refill  .  ibuprofen (ADVIL,MOTRIN) 200 MG tablet Take 200 mg by mouth every 6 (six) hours as needed for mild pain.    [name] lisinopril-hydrochlorothiazide (PRINZIDE,ZESTORETIC) 20-25 MG per tablet Take 1 tablet by mouth daily. 90 tablet 3   No current facility-administered medications on file prior to visit.    BP 150/84 mmHg  Pulse 92  Temp(Src) 98.2 F (36.8 C) (Oral)  Resp 20  Ht 6\' 1"  (1.854 m)  Wt 403 lb (182.8 kg)  BMI 53.18 kg/m2  SpO2 97%                     Review of Systems  Constitutional: Negative for fever, chills, appetite change and fatigue.  HENT: Negative for congestion, dental problem, ear pain, hearing loss, sore throat, tinnitus, trouble swallowing and voice change.   Eyes: Negative for pain, discharge and visual disturbance.  Respiratory: Negative for cough, chest tightness, wheezing and stridor.   Cardiovascular: Positive for leg swelling. Negative for chest pain and palpitations.  Gastrointestinal: Negative for nausea, vomiting, abdominal pain, diarrhea, constipation, blood in stool and abdominal distention.  Genitourinary: Negative for urgency, hematuria, flank pain, discharge, difficulty urinating and genital sores.  Musculoskeletal: Negative for myalgias,  back pain, joint swelling, arthralgias, gait problem and neck stiffness.  Skin: Negative for rash.  Neurological: Negative for dizziness, syncope, speech difficulty, weakness, numbness and headaches.  Hematological: Negative for adenopathy. Does not bruise/bleed easily.  Psychiatric/Behavioral: Positive for sleep disturbance. Negative for behavioral problems and dysphoric mood. The patient is not nervous/anxious.        Objective:   Physical Exam  Constitutional: He appears well-developed and well-nourished.  Blood pressure 136/80 Weight 401  HENT:  Head: Normocephalic and atraumatic.  Right Ear: External ear normal.  Left Ear: External ear normal.  Nose: Nose normal.  Mouth/Throat: Oropharynx  is clear and moist.  Eyes: Conjunctivae and EOM are normal. Pupils are equal, round, and reactive to light. No scleral icterus.  Neck: Normal range of motion. Neck supple. No JVD present. No thyromegaly present.  Cardiovascular: Regular rhythm, normal heart sounds and intact distal pulses.  Exam reveals no gallop and no friction rub.   No murmur heard. Pulmonary/Chest: Effort normal and breath sounds normal. He exhibits no tenderness.  Abdominal: Soft. Bowel sounds are normal. He exhibits no distension and no mass. There is no tenderness.  Genitourinary: Prostate normal and penis normal.  Musculoskeletal: Normal range of motion. He exhibits edema. He exhibits no tenderness.  Prominent lower extremity edema distal to the knees  Lymphadenopathy:    He has no cervical adenopathy.  Neurological: He is alert. He has normal reflexes. No cranial nerve deficit. Coordination normal.  Skin: Skin is warm and dry. No rash noted.  Psychiatric: He has a normal mood and affect. His behavior is normal.          Assessment & Plan:   Lower extremity edema.  Will place on a low-salt diet.  Information dispensed.  We'll continue present hypertensive therapy and add furosemide 40 mg every morning as needed.  We'll continue support hose Morbid obesity.  Weight loss encouraged OSA.  CPAP ordered  Testosterone insufficiency.  Patient wishes to resume biweekly injections.  New prescription dispensed

## 2014-07-09 NOTE — Progress Notes (Signed)
   Subjective:    Patient ID: Justin Norman, male    DOB: 1970-03-02, 44 y.o.   MRN: [id number]  HPI  44 year old morbidly obese smoker presents for FU of obstructive sleep apnea.  He reports excessive daytime sleepiness and his fianc has noted loud snoring and witnessed apneas.    07/09/2014  Chief Complaint  Patient presents with  . Sleep Apnea    Needs CPAP and supplies.  Patient had sleep study in 2014.  Patient    Last seen 09/2012 Never got  bi-pap machine -mother had dementia Gained 29 lbs last 2 yrs C/o pedal edema  Epworth sleepiness score is 15/24. He reports sleepiness and very suppression such as sitting and reading, watching TV, sitting inactive in a public place or lying down to rest in the afternoon.  Bedtime is around 11 PM, sleep latency is 10-20 minutes, he sleeps on her side with one pillow but prefers prone position. He has 4-5 awakenings including a bathroom visit and is out of bed at 6 AM feeling tired with dryness of mouth.  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 ERECTILE DYSFUNCTION 03/03/2007  . HYPERTENSION NEC 03/03/2007  . HYPERTENSION 07/03/2007  . Obesities, morbid   . Sleep apnea 09/2012    does not use Cpap  . Anxiety     during a divorce  . Kidney stone   . GERD (gastroesophageal reflux disease)     not taking meds  . Headache     headaches  . Dry skin   . Tear of medial meniscus of right knee 11/20/2013     Review of Systems neg for any significant sore throat, dysphagia, itching, sneezing, nasal congestion or excess/ purulent secretions, fever, chills, sweats, unintended wt loss, pleuritic or exertional cp,  hempoptysis, orthopnea pnd or change in chronic leg swelling. Also denies presyncope, palpitations, heartburn, abdominal pain, nausea, vomiting, diarrhea or change in bowel or urinary habits, dysuria,hematuria, rash, arthralgias, visual complaints, headache, numbness weakness or ataxia.     Objective:   Physical Exam  Gen. Pleasant, obese, in no distress, normal affect ENT - no lesions, no post nasal drip, class 2-3 airway Neck: No JVD, no thyromegaly, no carotid bruits Lungs: no use of accessory muscles, no dullness to percussion, decreased without rales or rhonchi  Cardiovascular: Rhythm regular, heart sounds  normal, no murmurs or gallops, no peripheral edema Abdomen: soft and non-tender, no hepatosplenomegaly, BS normal. Musculoskeletal: No deformities, no cyanosis or clubbing Neuro:  alert, non focal, no tremors       Assessment & Plan:

## 2014-07-09 NOTE — Patient Instructions (Signed)
You have severe obstructive sleep apnea  Rx will be sent to [facility] for autobipap with full face mask Download in 4 weeks Please use it at least 6h every night

## 2014-07-09 NOTE — Assessment & Plan Note (Signed)
severe obstructive sleep apnea  Rx will be sent to [facility] for autobipap with full face mask Download in 4 weeks Please use it at least 6h every night   The pathophysiology of obstructive sleep apnea , it's cardiovascular consequences & modes of treatment including CPAP were discused with the patient in detail & they evidenced understanding. Weight loss encouraged, compliance with goal of at least 4-6 hrs every night is the expectation. Advised against medications with sedative side effects Cautioned against driving when sleepy - understanding that sleepiness will vary on a day to day basis

## 2014-07-13 ENCOUNTER — Telehealth: Payer: Self-pay | Admitting: Pulmonary Disease

## 2014-07-13 NOTE — Telephone Encounter (Signed)
Called and spoke to [place] at [facility]. Per Pershing Cox was missing the pressure for the pt's BiPAP (included in the order) and also missing the pt's insurance--members ID and insurance info given to Scout Guyett [place]. Lucy verbalized understanding and denied any further issues at this time.

## 2014-08-17 ENCOUNTER — Ambulatory Visit: Payer: 59 | Admitting: Adult Health

## 2014-09-07 ENCOUNTER — Ambulatory Visit: Payer: 59 | Admitting: Adult Health

## 2014-10-07 ENCOUNTER — Encounter: Payer: Self-pay | Admitting: Internal Medicine

## 2014-10-07 ENCOUNTER — Ambulatory Visit (INDEPENDENT_AMBULATORY_CARE_PROVIDER_SITE_OTHER): Payer: 59 | Admitting: Internal Medicine

## 2014-10-07 VITALS — BP 140/80 | HR 112 | Temp 99.2°F | Resp 20 | Ht 73.0 in | Wt 392.0 lb

## 2014-10-07 DIAGNOSIS — I10 Essential (primary) hypertension: Secondary | ICD-10-CM | POA: Diagnosis not present

## 2014-10-07 DIAGNOSIS — B9789 Other viral agents as the cause of diseases classified elsewhere: Secondary | ICD-10-CM

## 2014-10-07 DIAGNOSIS — J069 Acute upper respiratory infection, unspecified: Secondary | ICD-10-CM | POA: Diagnosis not present

## 2014-10-07 MED ORDER — HYDROCODONE-HOMATROPINE 5-1.5 MG/5ML PO SYRP: 5.0000 mL | ORAL_SOLUTION | Freq: Four times a day (QID) | ORAL | Status: DC | PRN

## 2014-10-07 NOTE — Patient Instructions (Addendum)
Acute bronchitis symptoms for less than 10 days are generally not helped by antibiotics.  Take over-the-counter expectorants and cough medications such as  Mucinex DM.  Call if there is no improvement in 5 to 7 days or if  you develop worsening cough, fever, or new symptoms, such as shortness of breath or chest pain.  Limit your sodium (Salt) intake  Venous Stasis or Chronic Venous Insufficiency Chronic venous insufficiency, also called venous stasis, is a condition that affects the veins in the legs. The condition prevents blood from being pumped through these veins effectively. Blood may no longer be pumped effectively from the legs back to the heart. This condition can range from mild to severe. With proper treatment, you should be able to continue with an active life. CAUSES  Chronic venous insufficiency occurs when the vein walls become stretched, weakened, or damaged or when valves within the vein are damaged. Some common causes of this include:  High blood pressure inside the veins (venous hypertension).  Increased blood pressure in the leg veins from long periods of sitting or standing.  A blood clot that blocks blood flow in a vein (deep vein thrombosis).  Inflammation of a superficial vein (phlebitis) that causes a blood clot to form. RISK FACTORS Various things can make you more likely to develop chronic venous insufficiency, including:  Family history of this condition.  Obesity.  Pregnancy.  Sedentary lifestyle.  Smoking.  Jobs requiring long periods of standing or sitting in one place.  Being a certain age. Women in their 55s and 45s and men in their 61s are more likely to develop this condition. SIGNS AND SYMPTOMS  Symptoms may include:   Varicose veins.  Skin breakdown or ulcers.  Reddened or discolored skin on the leg.  Brown, smooth, tight, and painful skin just above the ankle, usually on the inside surface (lipodermatosclerosis).  Swelling. DIAGNOSIS   To diagnose this condition, your health care provider will take a medical history and do a physical exam. The following tests may be ordered to confirm the diagnosis:  Duplex ultrasound--A procedure that produces a picture of a blood vessel and nearby organs and also provides information on blood flow through the blood vessel.  Plethysmography--A procedure that tests blood flow.  A venogram, or venography--A procedure used to look at the veins using X-ray and dye. TREATMENT The goals of treatment are to help you return to an active life and to minimize pain or disability. Treatment will depend on the severity of the condition. Medical procedures may be needed for severe cases. Treatment options may include:   Use of compression stockings. These can help with symptoms and lower the chances of the problem getting worse, but they do not cure the problem.  Sclerotherapy--A procedure involving an injection of a material that "dissolves" the damaged veins. Other veins in the network of blood vessels take over the function of the damaged veins.  Surgery to remove the vein or cut off blood flow through the vein (vein stripping or laser ablation surgery).  Surgery to repair a valve. HOME CARE INSTRUCTIONS   Wear compression stockings as directed by your health care provider.  Only take over-the-counter or prescription medicines for pain, discomfort, or fever as directed by your health care provider.  Follow up with your health care provider as directed. SEEK MEDICAL CARE IF:   You have redness, swelling, or increasing pain in the affected area.  You see a red streak or line that extends up or down  from the affected area.  You have a breakdown or loss of skin in the affected area, even if the breakdown is small.  You have an injury to the affected area. SEEK IMMEDIATE MEDICAL CARE IF:   You have an injury and open wound in the affected area.  Your pain is severe and does not improve with  medicine.  You have sudden numbness or weakness in the foot or ankle below the affected area, or you have trouble moving your foot or ankle.  You have a fever or persistent symptoms for more than 2-3 days.  You have a fever and your symptoms suddenly get worse. MAKE SURE YOU:   Understand these instructions.  Will watch your condition.  Will get help right away if you are not doing well or get worse. Document Released: 06/04/2006 Document Revised: 11/19/2012 Document Reviewed: 10/06/2012 [facility] Patient Information 2015 [place], [place]. This information is not intended to replace advice given to you by your health care provider. Make sure you discuss any questions you have with your health care provider.

## 2014-10-07 NOTE — Progress Notes (Signed)
Subjective:    Patient ID: Justin Norman, male    DOB: December 10, 1970, 44 y.o.   MRN: [id number]  HPI  44 year old patient who presents complaining of a URI.  This began as a mild cough that has improved but he still is having a nonproductive cough.  It interferes with sleep.  He has intermittent chronic lower extremity edema and due to worsening left lower leg pain has been not elevating his left leg to the fairly extreme degree.  He has developed some left knee pain.  He has had a prior right meniscectomy.  He did have a venous Doppler study in March of this year that was negative for DVT. Chief complaints today are cough and left knee pain  Past Medical History  Diagnosis Date  . ERECTILE DYSFUNCTION 03/03/2007  . HYPERTENSION NEC 03/03/2007  . HYPERTENSION 07/03/2007  . Obesities, morbid   . Sleep apnea 09/2012    does not use Cpap  . Anxiety     during a divorce  . Kidney stone   . GERD (gastroesophageal reflux disease)     not taking meds  . Headache     headaches  . Dry skin   . Tear of medial meniscus of right knee 11/20/2013    Social History   Social History  . Marital Status: Divorced    Spouse Name: N/A  . Number of Children: 2  . Years of Education: N/A   Occupational History  .  [employer]   Social History Main Topics  . Smoking status: Current Every Day Smoker -- 0.50 packs/day for 15 years    Types: Cigarettes  . Smokeless tobacco: Never Used  . Alcohol Use: Yes     Comment: occasional  . Drug Use: No  . Sexual Activity: Not on file   Other Topics Concern  . Not on file   Social History Narrative    Past Surgical History  Procedure Laterality Date  . Vocal cord abscess surgery    . Knee arthroscopy Right 11/20/2013    Procedure: RIGHT KNEE ARTHROSCOPY WITH MEDIAL MENISCECTOMY ;  Surgeon: Johnny Bridge, MD;  Location: [facility];  Service: Orthopedics;  Laterality: Right;    Family History  Problem Relation Age of Onset  . Hypertension Mother    . Arthritis Mother   . Diabetes Mother   . Diabetes Sister   . Diabetes Sister   . Hypertension Sister   . Arrhythmia Sister     has pacemaker    No Known Allergies  Current Outpatient Prescriptions on File Prior to Visit  Medication Sig Dispense Refill  . furosemide (LASIX) 40 MG tablet Take 1 tablet (40 mg total) by mouth daily. 30 tablet 3  . ibuprofen (ADVIL,MOTRIN) 200 MG tablet Take 200 mg by mouth every 6 (six) hours as needed for mild pain.    [name] lisinopril-hydrochlorothiazide (PRINZIDE,ZESTORETIC) 20-25 MG per tablet Take 1 tablet by mouth daily. 90 tablet 3  . NEEDLE, DISP, 20 G (BD DISP NEEDLES) 20G X 1" MISC Use to draw up Testosterone medication every 14 days. 50 each 1  . NEEDLE, DISP, 23 G 23G X 1" MISC Use to inject Testosterone every 14 days. 25 each 1  . Syringe, Disposable, (B-D SYRINGE LUER-LOK 3CC) 3 ML MISC Use to inject Testosterone every 2 weeks 100 each 0  . testosterone cypionate (DEPOTESTOTERONE CYPIONATE) 200 MG/ML injection Inject 1 mL (200 mg total) into the muscle every 14 (fourteen) days. 10 mL 1  No current facility-administered medications on file prior to visit.    BP 140/80 mmHg  Pulse 112  Temp(Src) 99.2 F (37.3 C) (Oral)  Resp 20  Ht 6\' 1"  (1.854 m)  Wt 392 lb (177.81 kg)  BMI 51.73 kg/m2  SpO2 95%     Review of Systems  Constitutional: Negative for fever, chills, appetite change and fatigue.  HENT: Negative for congestion, dental problem, ear pain, hearing loss, sore throat, tinnitus, trouble swallowing and voice change.   Eyes: Negative for pain, discharge and visual disturbance.  Respiratory: Positive for cough. Negative for chest tightness, wheezing and stridor.   Cardiovascular: Negative for chest pain, palpitations and leg swelling.  Gastrointestinal: Negative for nausea, vomiting, abdominal pain, diarrhea, constipation, blood in stool and abdominal distention.  Genitourinary: Negative for urgency, hematuria, flank pain,  discharge, difficulty urinating and genital sores.  Musculoskeletal: Positive for arthralgias. Negative for myalgias, back pain, joint swelling, gait problem and neck stiffness.       Left knee pain  Skin: Negative for rash.  Neurological: Negative for dizziness, syncope, speech difficulty, weakness, numbness and headaches.  Hematological: Negative for adenopathy. Does not bruise/bleed easily.  Psychiatric/Behavioral: Negative for behavioral problems and dysphoric mood. The patient is not nervous/anxious.        Objective:   Physical Exam  Constitutional: He is oriented to person, place, and time. He appears well-developed.  Blood pressure 130/82  HENT:  Head: Normocephalic.  Right Ear: External ear normal.  Left Ear: External ear normal.  Eyes: Conjunctivae and EOM are normal.  Neck: Normal range of motion.  Cardiovascular: Normal rate and normal heart sounds.   Pulmonary/Chest: Breath sounds normal.  Abdominal: Bowel sounds are normal.  Musculoskeletal: Normal range of motion. He exhibits tenderness. He exhibits no edema.  Left knee slightly warm to touch, but no obvious effusion  Left lower leg edema without tenderness  Support hose in place  Neurological: He is alert and oriented to person, place, and time.  Psychiatric: He has a normal mood and affect. His behavior is normal.          Assessment & Plan:   Viral URI with cough Left leg edema Left knee pain.  Probable strain secondary to over extension and elevation.  Pain feels much different from prior meniscal tear on the right.  Will continue Advil and observe

## 2014-10-07 NOTE — Progress Notes (Signed)
Pre visit review using our clinic review tool, if applicable. No additional management support is needed unless otherwise documented below in the visit note. 

## 2015-01-04 ENCOUNTER — Other Ambulatory Visit (INDEPENDENT_AMBULATORY_CARE_PROVIDER_SITE_OTHER): Payer: 59

## 2015-01-04 DIAGNOSIS — Z0000 Encounter for general adult medical examination without abnormal findings: Secondary | ICD-10-CM | POA: Diagnosis not present

## 2015-01-04 LAB — TSH: TSH: 1.5 u[IU]/mL (ref 0.35–4.50)

## 2015-01-04 LAB — POCT URINALYSIS DIPSTICK
Bilirubin, UA: NEGATIVE
Glucose, UA: NEGATIVE
Ketones, UA: NEGATIVE
Leukocytes, UA: NEGATIVE
NITRITE UA: NEGATIVE
PH UA: 6
Protein, UA: NEGATIVE
RBC UA: NEGATIVE
Spec Grav, UA: 1.025
UROBILINOGEN UA: 0.2

## 2015-01-04 LAB — CBC WITH DIFFERENTIAL/PLATELET
BASOS ABS: 0 10*3/uL (ref 0.0–0.1)
Basophils Relative: 0.3 % (ref 0.0–3.0)
Eosinophils Absolute: 0.4 10*3/uL (ref 0.0–0.7)
Eosinophils Relative: 6.5 % — ABNORMAL HIGH (ref 0.0–5.0)
HCT: 49.5 % (ref 39.0–52.0)
Hemoglobin: 16.3 g/dL (ref 13.0–17.0)
LYMPHS ABS: 1.9 10*3/uL (ref 0.7–4.0)
Lymphocytes Relative: 29.2 % (ref 12.0–46.0)
MCHC: 32.8 g/dL (ref 30.0–36.0)
MCV: 92.1 fl (ref 78.0–100.0)
MONOS PCT: 8.6 % (ref 3.0–12.0)
Monocytes Absolute: 0.6 10*3/uL (ref 0.1–1.0)
NEUTROS ABS: 3.6 10*3/uL (ref 1.4–7.7)
NEUTROS PCT: 55.4 % (ref 43.0–77.0)
PLATELETS: 227 10*3/uL (ref 150.0–400.0)
RBC: 5.37 Mil/uL (ref 4.22–5.81)
RDW: 14.8 % (ref 11.5–15.5)
WBC: 6.4 10*3/uL (ref 4.0–10.5)

## 2015-01-04 LAB — BASIC METABOLIC PANEL
BUN: 12 mg/dL (ref 6–23)
CALCIUM: 9.2 mg/dL (ref 8.4–10.5)
CO2: 30 mEq/L (ref 19–32)
Chloride: 102 mEq/L (ref 96–112)
Creatinine, Ser: 0.98 mg/dL (ref 0.40–1.50)
GFR: 106.28 mL/min (ref >60.00)
GLUCOSE: 103 mg/dL — AB (ref 70–99)
POTASSIUM: 4.4 meq/L (ref 3.5–5.1)
SODIUM: 140 meq/L (ref 135–145)

## 2015-01-04 LAB — LIPID PANEL
CHOLESTEROL: 188 mg/dL (ref 0–200)
HDL: 62.2 mg/dL (ref >39.00)
LDL Cholesterol: 112 mg/dL — ABNORMAL HIGH (ref 0–99)
NonHDL: 125.82
TRIGLYCERIDES: 68 mg/dL (ref 0.0–149.0)
Total CHOL/HDL Ratio: 3
VLDL: 13.6 mg/dL (ref 0.0–40.0)

## 2015-01-04 LAB — HEPATIC FUNCTION PANEL
ALBUMIN: 4.1 g/dL (ref 3.5–5.2)
ALT: 25 U/L (ref 0–53)
AST: 17 U/L (ref 0–37)
Alkaline Phosphatase: 44 U/L (ref 39–117)
Bilirubin, Direct: 0.1 mg/dL (ref 0.0–0.3)
Total Bilirubin: 0.6 mg/dL (ref 0.2–1.2)
Total Protein: 7.5 g/dL (ref 6.0–8.3)

## 2015-01-04 LAB — PSA: PSA: 1.47 ng/mL (ref 0.10–4.00)

## 2015-01-11 ENCOUNTER — Encounter: Payer: Self-pay | Admitting: Internal Medicine

## 2015-01-11 ENCOUNTER — Ambulatory Visit (INDEPENDENT_AMBULATORY_CARE_PROVIDER_SITE_OTHER): Payer: 59 | Admitting: Internal Medicine

## 2015-01-11 VITALS — BP 150/100 | HR 71 | Temp 98.2°F | Resp 20 | Ht 73.0 in | Wt 390.0 lb

## 2015-01-11 DIAGNOSIS — I10 Essential (primary) hypertension: Secondary | ICD-10-CM

## 2015-01-11 DIAGNOSIS — R7302 Impaired glucose tolerance (oral): Secondary | ICD-10-CM

## 2015-01-11 DIAGNOSIS — Z0000 Encounter for general adult medical examination without abnormal findings: Secondary | ICD-10-CM | POA: Diagnosis not present

## 2015-01-11 DIAGNOSIS — Z23 Encounter for immunization: Secondary | ICD-10-CM | POA: Diagnosis not present

## 2015-01-11 DIAGNOSIS — E291 Testicular hypofunction: Secondary | ICD-10-CM

## 2015-01-11 DIAGNOSIS — E349 Endocrine disorder, unspecified: Secondary | ICD-10-CM

## 2015-01-11 DIAGNOSIS — E119 Type 2 diabetes mellitus without complications: Secondary | ICD-10-CM | POA: Insufficient documentation

## 2015-01-11 MED ORDER — FUROSEMIDE 40 MG PO TABS: 40.0000 mg | ORAL_TABLET | Freq: Every day | ORAL | Status: DC

## 2015-01-11 MED ORDER — LISINOPRIL-HYDROCHLOROTHIAZIDE 20-25 MG PO TABS: 1.0000 | ORAL_TABLET | Freq: Every day | ORAL | Status: DC

## 2015-01-11 NOTE — Progress Notes (Signed)
Subjective:    Patient ID: Justin Norman, male    DOB: 07-18-1970, 44 y.o.   MRN: [id number]  HPI   Wt Readings from Last 3 Encounters:  01/11/15 390 lb (176.903 kg)  10/07/14 392 lb (177.81 kg)  07/09/14 403 lb (36.76 kg)   44 year-old patient who is seen today for a preventive health examination.   Medical problems include exogenous obesity.  He has had  right knee arthroscopic surgery.  Comorbidities also include OSA as well as hypertension.  He has a history of testosterone deficiency and ED. Presently no testosterone replacement     Family history.  Recently lost his mother last year- complications of advanced dementia  Past Medical History  Diagnosis Date  . ERECTILE DYSFUNCTION 03/03/2007  . HYPERTENSION NEC 03/03/2007  . HYPERTENSION 07/03/2007  . Obesities, morbid ([place])   . Sleep apnea 09/2012    does not use Cpap  . Anxiety     during a divorce  . Kidney stone   . GERD (gastroesophageal reflux disease)     not taking meds  . Headache     headaches  . Dry skin   . Tear of medial meniscus of right knee 11/20/2013    Social History   Social History  . Marital Status: Divorced    Spouse Name: N/A  . Number of Children: 2  . Years of Education: N/A   Occupational History  .  [employer]   Social History Main Topics  . Smoking status: Current Every Day Smoker -- 0.50 packs/day for 15 years    Types: Cigarettes  . Smokeless tobacco: Never Used  . Alcohol Use: Yes     Comment: occasional  . Drug Use: No  . Sexual Activity: Not on file   Other Topics Concern  . Not on file   Social History Narrative    Past Surgical History  Procedure Laterality Date  . Vocal cord abscess surgery    . Knee arthroscopy Right 11/20/2013    Procedure: RIGHT KNEE ARTHROSCOPY WITH MEDIAL MENISCECTOMY ;  Surgeon: Johnny Bridge, MD;  Location: [facility];  Service: Orthopedics;  Laterality: Right;    Family History  Problem Relation Age of Onset  . Hypertension  Mother   . Arthritis Mother   . Diabetes Mother   . Diabetes Sister   . Diabetes Sister   . Hypertension Sister   . Arrhythmia Sister     has pacemaker    No Known Allergies  Current Outpatient Prescriptions on File Prior to Visit  Medication Sig Dispense Refill  . HYDROcodone-homatropine (HYCODAN) 5-1.5 MG/5ML syrup Take 5 mLs by mouth every 6 (six) hours as needed for cough. 120 mL 0  . ibuprofen (ADVIL,MOTRIN) 200 MG tablet Take 200 mg by mouth every 6 (six) hours as needed for mild pain.    [name] NEEDLE, DISP, 20 G (BD DISP NEEDLES) 20G X 1" MISC Use to draw up Testosterone medication every 14 days. (Patient not taking: Reported on 01/11/2015) 50 each 1  . NEEDLE, DISP, 23 G 23G X 1" MISC Use to inject Testosterone every 14 days. (Patient not taking: Reported on 01/11/2015) 25 each 1  . Syringe, Disposable, (B-D SYRINGE LUER-LOK 3CC) 3 ML MISC Use to inject Testosterone every 2 weeks (Patient not taking: Reported on 01/11/2015) 100 each 0  . testosterone cypionate (DEPOTESTOTERONE CYPIONATE) 200 MG/ML injection Inject 1 mL (200 mg total) into the muscle every 14 (fourteen) days. (Patient not taking: Reported on 01/11/2015)  10 mL 1   No current facility-administered medications on file prior to visit.    BP 150/100 mmHg  Pulse 71  Temp(Src) 98.2 F (36.8 C) (Oral)  Resp 20  Ht 6\' 1"  (1.854 m)  Wt 390 lb (176.903 kg)  BMI 51.47 kg/m2  SpO2 96%    Review of Systems  Constitutional: Negative for fever, chills, activity change, appetite change and fatigue.  HENT: Negative for congestion, dental problem, ear pain, hearing loss, mouth sores, rhinorrhea, sinus pressure, sneezing, tinnitus, trouble swallowing and voice change.   Eyes: Negative for photophobia, pain, redness and visual disturbance.  Respiratory: Negative for apnea, cough, choking, chest tightness, shortness of breath and wheezing.   Cardiovascular: Negative for chest pain, palpitations and leg swelling.    Gastrointestinal: Negative for nausea, vomiting, abdominal pain, diarrhea, constipation, blood in stool, abdominal distention, anal bleeding and rectal pain.  Genitourinary: Negative for dysuria, urgency, frequency, hematuria, flank pain, decreased urine volume, discharge, penile swelling, scrotal swelling, difficulty urinating, genital sores and testicular pain.  Musculoskeletal: Positive for gait problem. Negative for myalgias, back pain, joint swelling, arthralgias, neck pain and neck stiffness.  Skin: Negative for color change, rash and wound.  Neurological: Negative for dizziness, tremors, seizures, syncope, facial asymmetry, speech difficulty, weakness, light-headedness, numbness and headaches.  Hematological: Negative for adenopathy. Does not bruise/bleed easily.  Psychiatric/Behavioral: Negative for suicidal ideas, hallucinations, behavioral problems, confusion, sleep disturbance, self-injury, dysphoric mood, decreased concentration and agitation. The patient is not nervous/anxious.        Objective:   Physical Exam  Constitutional: He appears well-developed and well-nourished.  HENT:  Head: Normocephalic and atraumatic.  Right Ear: External ear normal.  Left Ear: External ear normal.  Nose: Nose normal.  Mouth/Throat: Oropharynx is clear and moist.  Eyes: Conjunctivae and EOM are normal. Pupils are equal, round, and reactive to light. No scleral icterus.  Neck: Normal range of motion. Neck supple. No JVD present. No thyromegaly present.  Cardiovascular: Regular rhythm, normal heart sounds and intact distal pulses.  Exam reveals no gallop and no friction rub.   No murmur heard. Pulmonary/Chest: Effort normal and breath sounds normal. He exhibits no tenderness.  Abdominal: Soft. Bowel sounds are normal. He exhibits no distension and no mass. There is no tenderness.  Genitourinary: Penis normal.  Musculoskeletal: Normal range of motion. He exhibits edema. He exhibits no tenderness.   Lymphadenopathy:    He has no cervical adenopathy.  Neurological: He is alert. He has normal reflexes. No cranial nerve deficit. Coordination normal.  Skin: Skin is warm and dry. No rash noted.  Psychiatric: He has a normal mood and affect. His behavior is normal.          Assessment & Plan:   Preventive health examination Morbid obesity.  Comorbidities include knee pain OSA and hypertension.  He has had dietary evaluation in the past and has tried a number of diets.  His activity has been limited due to right knee pain, but more recently has joined a health club.  He has been asked to consider bariatric surgery in the past.  Information dispensed Hypertension, controlled.  Blood pressure up a bit today, but patient has not taken any medications.  Will continue home blood pressure monitoring.  Blood pressures generally have been stable Testosterone insufficiency OSA. Improved with more regular use of CPAP  Recheck 6 months

## 2015-01-11 NOTE — Patient Instructions (Signed)
Limit your sodium (Salt) intake  Please check your blood pressure on a regular basis.  If it is consistently greater than 150/90, please make an office appointment.    It is important that you exercise regularly, at least 20 minutes 3 to 4 times per week.  If you develop chest pain or shortness of breath seek  medical attention.  You need to lose weight.  Consider a lower calorie diet and regular exercise.  Health Maintenance, Male A healthy lifestyle and preventative care can promote health and wellness.  Maintain regular health, dental, and eye exams.  Eat a healthy diet. Foods like vegetables, fruits, whole grains, low-fat dairy products, and lean protein foods contain the nutrients you need and are low in calories. Decrease your intake of foods high in solid fats, added sugars, and salt. Get information about a proper diet from your health care provider, if necessary.  Regular physical exercise is one of the most important things you can do for your health. Most adults should get at least 150 minutes of moderate-intensity exercise (any activity that increases your heart rate and causes you to sweat) each week. In addition, most adults need muscle-strengthening exercises on 2 or more days a week.   Maintain a healthy weight. The body mass index (BMI) is a screening tool to identify possible weight problems. It provides an estimate of body fat based on height and weight. Your health care provider can find your BMI and can help you achieve or maintain a healthy weight. For males 20 years and older:  A BMI below 18.5 is considered underweight.  A BMI of 18.5 to 24.9 is normal.  A BMI of 25 to 29.9 is considered overweight.  A BMI of 30 and above is considered obese.  Maintain normal blood lipids and cholesterol by exercising and minimizing your intake of saturated fat. Eat a balanced diet with plenty of fruits and vegetables. Blood tests for lipids and cholesterol should begin at age 20  and be repeated every 5 years. If your lipid or cholesterol levels are high, you are over age 50, or you are at high risk for heart disease, you may need your cholesterol levels checked more frequently.Ongoing high lipid and cholesterol levels should be treated with medicines if diet and exercise are not working.  If you smoke, find out from your health care provider how to quit. If you do not use tobacco, do not start.  Lung cancer screening is recommended for adults aged 55-80 years who are at high risk for developing lung cancer because of a history of smoking. A yearly low-dose CT scan of the lungs is recommended for people who have at least a 30-pack-year history of smoking and are current smokers or have quit within the past 15 years. A pack year of smoking is smoking an average of 1 pack of cigarettes a day for 1 year (for example, a 30-pack-year history of smoking could mean smoking 1 pack a day for 30 years or 2 packs a day for 15 years). Yearly screening should continue until the smoker has stopped smoking for at least 15 years. Yearly screening should be stopped for people who develop a health problem that would prevent them from having lung cancer treatment.  If you choose to drink alcohol, do not have more than 2 drinks per day. One drink is considered to be 12 oz (360 mL) of beer, 5 oz (150 mL) of wine, or 1.5 oz (45 mL) of liquor.  Avoid   the use of street drugs. Do not share needles with anyone. Ask for help if you need support or instructions about stopping the use of drugs.  High blood pressure causes heart disease and increases the risk of stroke. High blood pressure is more likely to develop in:  People who have blood pressure in the end of the normal range (100-139/85-89 mm Hg).  People who are overweight or obese.  People who are African American.  If you are 18-39 years of age, have your blood pressure checked every 3-5 years. If you are 40 years of age or older, have your  blood pressure checked every year. You should have your blood pressure measured twice--once when you are at a hospital or clinic, and once when you are not at a hospital or clinic. Record the average of the two measurements. To check your blood pressure when you are not at a hospital or clinic, you can use:  An automated blood pressure machine at a pharmacy.  A home blood pressure monitor.  If you are 45-79 years old, ask your health care provider if you should take aspirin to prevent heart disease.  Diabetes screening involves taking a blood sample to check your fasting blood sugar level. This should be done once every 3 years after age 45 if you are at a normal weight and without risk factors for diabetes. Testing should be considered at a younger age or be carried out more frequently if you are overweight and have at least 1 risk factor for diabetes.  Colorectal cancer can be detected and often prevented. Most routine colorectal cancer screening begins at the age of 50 and continues through age 75. However, your health care provider may recommend screening at an earlier age if you have risk factors for colon cancer. On a yearly basis, your health care provider may provide home test kits to check for hidden blood in the stool. A small camera at the end of a tube may be used to directly examine the colon (sigmoidoscopy or colonoscopy) to detect the earliest forms of colorectal cancer. Talk to your health care provider about this at age 50 when routine screening begins. A direct exam of the colon should be repeated every 5-10 years through age 75, unless early forms of precancerous polyps or small growths are found.  People who are at an increased risk for hepatitis B should be screened for this virus. You are considered at high risk for hepatitis B if:  You were born in a country where hepatitis B occurs often. Talk with your health care provider about which countries are considered high risk.  Your  parents were born in a high-risk country and you have not received a shot to protect against hepatitis B (hepatitis B vaccine).  You have HIV or AIDS.  You use needles to inject street drugs.  You live with, or have sex with, someone who has hepatitis B.  You are a man who has sex with other men (MSM).  You get hemodialysis treatment.  You take certain medicines for conditions like cancer, organ transplantation, and autoimmune conditions.  Hepatitis C blood testing is recommended for all people born from 1945 through 1965 and any individual with known risk factors for hepatitis C.  Healthy men should no longer receive prostate-specific antigen (PSA) blood tests as part of routine cancer screening. Talk to your health care provider about prostate cancer screening.  Testicular cancer screening is not recommended for adolescents or adult males who have   no symptoms. Screening includes self-exam, a health care provider exam, and other screening tests. Consult with your health care provider about any symptoms you have or any concerns you have about testicular cancer.  Practice safe sex. Use condoms and avoid high-risk sexual practices to reduce the spread of sexually transmitted infections (STIs).  You should be screened for STIs, including gonorrhea and chlamydia if:  You are sexually active and are younger than 24 years.  You are older than 24 years, and your health care provider tells you that you are at risk for this type of infection.  Your sexual activity has changed since you were last screened, and you are at an increased risk for chlamydia or gonorrhea. Ask your health care provider if you are at risk.  If you are at risk of being infected with HIV, it is recommended that you take a prescription medicine daily to prevent HIV infection. This is called pre-exposure prophylaxis (PrEP). You are considered at risk if:  You are a man who has sex with other men (MSM).  You are a  heterosexual man who is sexually active with multiple partners.  You take drugs by injection.  You are sexually active with a partner who has HIV.  Talk with your health care provider about whether you are at high risk of being infected with HIV. If you choose to begin PrEP, you should first be tested for HIV. You should then be tested every 3 months for as long as you are taking PrEP.  Use sunscreen. Apply sunscreen liberally and repeatedly throughout the day. You should seek shade when your shadow is shorter than you. Protect yourself by wearing long sleeves, pants, a wide-brimmed hat, and sunglasses year round whenever you are outdoors.  Tell your health care provider of new moles or changes in moles, especially if there is a change in shape or color. Also, tell your health care provider if a mole is larger than the size of a pencil eraser.  A one-time screening for abdominal aortic aneurysm (AAA) and surgical repair of large AAAs by ultrasound is recommended for men aged 65-75 years who are current or former smokers.  Stay current with your vaccines (immunizations).   This information is not intended to replace advice given to you by your health care provider. Make sure you discuss any questions you have with your health care provider.   Document Released: 07/28/2007 Document Revised: 02/19/2014 Document Reviewed: 06/26/2010 Elsevier Interactive Patient Education 2016 [employer] Inc[employer]  

## 2015-01-11 NOTE — Addendum Note (Signed)
Addended by: Marian Sorrow on: 01/11/2015 04:19 PM   Modules accepted: Orders

## 2015-01-11 NOTE — Progress Notes (Signed)
Pre visit review using our clinic review tool, if applicable. No additional management support is needed unless otherwise documented below in the visit note. 

## 2015-06-06 ENCOUNTER — Ambulatory Visit: Payer: 59 | Admitting: Internal Medicine

## 2015-09-06 ENCOUNTER — Ambulatory Visit (INDEPENDENT_AMBULATORY_CARE_PROVIDER_SITE_OTHER): Payer: 59 | Admitting: Internal Medicine

## 2015-09-06 ENCOUNTER — Encounter: Payer: Self-pay | Admitting: Internal Medicine

## 2015-09-06 VITALS — BP 168/92 | HR 91 | Temp 98.6°F | Ht 73.0 in | Wt 372.4 lb

## 2015-09-06 DIAGNOSIS — G4733 Obstructive sleep apnea (adult) (pediatric): Secondary | ICD-10-CM | POA: Diagnosis not present

## 2015-09-06 DIAGNOSIS — F17200 Nicotine dependence, unspecified, uncomplicated: Secondary | ICD-10-CM

## 2015-09-06 DIAGNOSIS — J441 Chronic obstructive pulmonary disease with (acute) exacerbation: Secondary | ICD-10-CM

## 2015-09-06 DIAGNOSIS — I10 Essential (primary) hypertension: Secondary | ICD-10-CM | POA: Diagnosis not present

## 2015-09-06 MED ORDER — AZITHROMYCIN 250 MG PO TABS: ORAL_TABLET | ORAL | 0 refills | Status: DC

## 2015-09-06 MED ORDER — LISINOPRIL-HYDROCHLOROTHIAZIDE 20-25 MG PO TABS: 1.0000 | ORAL_TABLET | Freq: Every day | ORAL | 5 refills | Status: DC

## 2015-09-06 MED ORDER — FUROSEMIDE 40 MG PO TABS: 40.0000 mg | ORAL_TABLET | Freq: Every day | ORAL | 5 refills | Status: DC

## 2015-09-06 MED ORDER — PREDNISONE 20 MG PO TABS: 20.0000 mg | ORAL_TABLET | Freq: Two times a day (BID) | ORAL | 0 refills | Status: DC

## 2015-09-06 NOTE — Progress Notes (Signed)
Subjective:    Patient ID: Justin Norman, male    DOB: [birth year], 45 y.o.   MRN: [id number]  HPI  45 year old patient who has a history of essential hypertension, OSA as well as ongoing tobacco use. He states that he has had a cough for a least the past month and perhaps as long as 2 months.  He describes considerable cough, congestion, intermittent wheezing and difficulty sleeping at night due to the cough.  He has had minimal sputum production.  He does use a CPAP for 71/2  Hours nightly.  Denies any fever.  He does describe some fatigue  Past Medical History:  Diagnosis Date  . Anxiety    during a divorce  . Dry skin   . ERECTILE DYSFUNCTION 03/03/2007  . GERD (gastroesophageal reflux disease)    not taking meds  . Headache    headaches  . HYPERTENSION 07/03/2007  . HYPERTENSION NEC 03/03/2007  . Kidney stone   . Obesities, morbid ([place])   . Sleep apnea 09/2012   does not use Cpap  . Tear of medial meniscus of right knee 11/20/2013     Social History   Social History  . Marital status: Divorced    Spouse name: N/A  . Number of children: 2  . Years of education: N/A   Occupational History  .  [employer]   Social History Main Topics  . Smoking status: Current Every Day Smoker    Packs/day: 0.50    Years: 15.00    Types: Cigarettes  . Smokeless tobacco: Never Used  . Alcohol use Yes     Comment: occasional  . Drug use: No  . Sexual activity: Not on file   Other Topics Concern  . Not on file   Social History Narrative  . No narrative on file    Past Surgical History:  Procedure Laterality Date  . KNEE ARTHROSCOPY Right 11/20/2013   Procedure: RIGHT KNEE ARTHROSCOPY WITH MEDIAL MENISCECTOMY ;  Surgeon: Johnny Bridge, MD;  Location: [facility];  Service: Orthopedics;  Laterality: Right;  . vocal cord abscess surgery      Family History  Problem Relation Age of Onset  . Hypertension Mother   . Arthritis Mother   . Diabetes Mother   . Diabetes Sister     . Diabetes Sister   . Hypertension Sister   . Arrhythmia Sister     has pacemaker    No Known Allergies  Current Outpatient Prescriptions on File Prior to Visit  Medication Sig Dispense Refill  . HYDROcodone-homatropine (HYCODAN) 5-1.5 MG/5ML syrup Take 5 mLs by mouth every 6 (six) hours as needed for cough. 120 mL 0  . ibuprofen (ADVIL,MOTRIN) 200 MG tablet Take 200 mg by mouth every 6 (six) hours as needed for mild pain.    [name] NEEDLE, DISP, 20 G (BD DISP NEEDLES) 20G X 1" MISC Use to draw up Testosterone medication every 14 days. 50 each 1  . NEEDLE, DISP, 23 G 23G X 1" MISC Use to inject Testosterone every 14 days. 25 each 1  . Syringe, Disposable, (B-D SYRINGE LUER-LOK 3CC) 3 ML MISC Use to inject Testosterone every 2 weeks 100 each 0  . testosterone cypionate (DEPOTESTOTERONE CYPIONATE) 200 MG/ML injection Inject 1 mL (200 mg total) into the muscle every 14 (fourteen) days. 10 mL 1   No current facility-administered medications on file prior to visit.     BP (!) 168/92   Pulse 91   Temp 98.6 F (  37 C) (Oral)   Ht 6\' 1"  (1.854 m)   Wt (!) 372 lb 6 oz (168.9 kg)   SpO2 96%   BMI 49.13 kg/m     Review of Systems  Constitutional: Positive for fatigue. Negative for appetite change, chills and fever.  HENT: Negative for congestion, dental problem, ear pain, hearing loss, sore throat, tinnitus, trouble swallowing and voice change.   Eyes: Negative for pain, discharge and visual disturbance.  Respiratory: Positive for cough, shortness of breath and wheezing. Negative for chest tightness and stridor.   Cardiovascular: Negative for chest pain, palpitations and leg swelling.  Gastrointestinal: Negative for abdominal distention, abdominal pain, blood in stool, constipation, diarrhea, nausea and vomiting.  Genitourinary: Negative for difficulty urinating, discharge, flank pain, genital sores, hematuria and urgency.  Musculoskeletal: Negative for arthralgias, back pain, gait problem,  joint swelling, myalgias and neck stiffness.  Skin: Negative for rash.  Neurological: Negative for dizziness, syncope, speech difficulty, weakness, numbness and headaches.  Hematological: Negative for adenopathy. Does not bruise/bleed easily.  Psychiatric/Behavioral: Negative for behavioral problems and dysphoric mood. The patient is not nervous/anxious.        Objective:   Physical Exam  Constitutional: He is oriented to person, place, and time. He appears well-developed.  HENT:  Head: Normocephalic.  Right Ear: External ear normal.  Left Ear: External ear normal.  Eyes: Conjunctivae and EOM are normal.  Neck: Normal range of motion.  Cardiovascular: Normal rate and normal heart sounds.   Pulmonary/Chest: Effort normal. He has wheezes.  Diffuse coarse rhonchi and scattered faint wheezing  Abdominal: Bowel sounds are normal.  Musculoskeletal: Normal range of motion. He exhibits no edema or tenderness.  Neurological: He is alert and oriented to person, place, and time.  Psychiatric: He has a normal mood and affect. His behavior is normal.          Assessment & Plan:   Exacerbation chronic bronchitis.  Will treat with expectorants and force fluids.  Will treat with a brief course of oral prednisone and azithromycin.  Total smoking cessation encouraged Hypertension.  Repeat blood pressure 132/90.  Weight loss encouraged OSA  CPX 6 months  [name], MD

## 2015-09-06 NOTE — Patient Instructions (Signed)
Take over-the-counter expectorants and cough medications such as  Mucinex DM.  Call if there is no improvement in 5 to 7 days or if  you develop worsening cough, fever, or new symptoms, such as shortness of breath or chest pain.  Prednisone 20 mg twice daily for 6 days  Take your antibiotic as prescribed until ALL of it is gone, but stop if you develop a rash, swelling, or any side effects of the medication.  Contact our office as soon as possible if  there are side effects of the medication.

## 2016-02-02 ENCOUNTER — Encounter: Payer: Self-pay | Admitting: Family Medicine

## 2016-02-02 ENCOUNTER — Ambulatory Visit (INDEPENDENT_AMBULATORY_CARE_PROVIDER_SITE_OTHER): Payer: 59 | Admitting: Family Medicine

## 2016-02-02 VITALS — BP 140/82 | HR 80 | Temp 98.1°F | Ht 73.0 in | Wt 378.2 lb

## 2016-02-02 DIAGNOSIS — J069 Acute upper respiratory infection, unspecified: Secondary | ICD-10-CM

## 2016-02-02 DIAGNOSIS — F17200 Nicotine dependence, unspecified, uncomplicated: Secondary | ICD-10-CM | POA: Diagnosis not present

## 2016-02-02 MED ORDER — HYDROCODONE-HOMATROPINE 5-1.5 MG/5ML PO SYRP: 5.0000 mL | ORAL_SOLUTION | Freq: Every evening | ORAL | 0 refills | Status: DC | PRN

## 2016-02-02 NOTE — Progress Notes (Signed)
HPI:  URI: -started: 5 days ago -symptoms:nasal congestion, sore throat, cough, sweats and mild wheezing initially - now doing much better but still with some clear nasal congestion and cough -denies:fever, SOB, NVD, tooth pain, body aches -has tried: OTC options, reports last time he was sick PCP gave him cough syrup that really helped with cough at night - on ROC this was hycodan -sick contacts/travel/risks: no reported flu, strep or tick exposure -hx smoking, OSA on CPAP, ? COPD - reports has cut back to 1 pack per week on smoking  ROS: See pertinent positives and negatives per HPI.  Past Medical History:  Diagnosis Date  . Anxiety    during a divorce  . Dry skin   . ERECTILE DYSFUNCTION 03/03/2007  . GERD (gastroesophageal reflux disease)    not taking meds  . Headache    headaches  . HYPERTENSION 07/03/2007  . HYPERTENSION NEC 03/03/2007  . Kidney stone   . Obesities, morbid ([place])   . Sleep apnea 09/2012   does not use Cpap  . Tear of medial meniscus of right knee 11/20/2013    Past Surgical History:  Procedure Laterality Date  . KNEE ARTHROSCOPY Right 11/20/2013   Procedure: RIGHT KNEE ARTHROSCOPY WITH MEDIAL MENISCECTOMY ;  Surgeon: Johnny Bridge, MD;  Location: [place];  Service: Orthopedics;  Laterality: Right;  . vocal cord abscess surgery      Family History  Problem Relation Age of Onset  . Hypertension Mother   . Arthritis Mother   . Diabetes Mother   . Diabetes Sister   . Diabetes Sister   . Hypertension Sister   . Arrhythmia Sister     has pacemaker    Social History   Social History  . Marital status: Divorced    Spouse name: N/A  . Number of children: 2  . Years of education: N/A   Occupational History  .  [employer]   Social History Main Topics  . Smoking status: Current Every Day Smoker    Packs/day: 0.50    Years: 15.00    Types: Cigarettes  . Smokeless tobacco: Never Used  . Alcohol use Yes     Comment: occasional  . Drug  use: No  . Sexual activity: Not Asked   Other Topics Concern  . None   Social History Narrative  . None     Current Outpatient Prescriptions:  .  furosemide (LASIX) 40 MG tablet, Take 1 tablet (40 mg total) by mouth daily., Disp: 30 tablet, Rfl: 5 .  ibuprofen (ADVIL,MOTRIN) 200 MG tablet, Take 200 mg by mouth every 6 (six) hours as needed for mild pain., Disp: , Rfl:  .  lisinopril-hydrochlorothiazide (PRINZIDE,ZESTORETIC) 20-25 MG tablet, Take 1 tablet by mouth daily., Disp: 30 tablet, Rfl: 5 .  NEEDLE, DISP, 20 G (BD DISP NEEDLES) 20G X 1" MISC, Use to draw up Testosterone medication every 14 days., Disp: 50 each, Rfl: 1 .  NEEDLE, DISP, 23 G 23G X 1" MISC, Use to inject Testosterone every 14 days., Disp: 25 each, Rfl: 1 .  Syringe, Disposable, (B-D SYRINGE LUER-LOK 3CC) 3 ML MISC, Use to inject Testosterone every 2 weeks, Disp: 100 each, Rfl: 0 .  HYDROcodone-homatropine (HYCODAN) 5-1.5 MG/5ML syrup, Take 5 mLs by mouth at bedtime as needed for cough., Disp: 120 mL, Rfl: 0  EXAM:  Vitals:   02/02/16 1103  BP: 140/82  Pulse: 80  Temp: 98.1 F (36.7 C)    Body mass index is  49.9 kg/m.  GENERAL: vitals reviewed and listed above, alert, oriented, appears well hydrated and in no acute distress  HEENT: atraumatic, conjunttiva clear, no obvious abnormalities on inspection of external nose and ears, normal appearance of ear canals and TMs, clear nasal congestion, mild post oropharyngeal erythema with PND, no tonsillar edema or exudate, no sinus TTP  NECK: no obvious masses on inspection  LUNGS: clear to auscultation bilaterally, no wheezes, rales or rhonchi, good air movement, no signs respiratory distress, O2 sats normal.  CV: HRRR, no peripheral edema  MS: moves all extremities without noticeable abnormality  PSYCH: pleasant and cooperative, no obvious depression or anxiety  ASSESSMENT AND PLAN:  Discussed the following assessment and plan:  Acute upper respiratory  infection  Tobacco use disorder  -given HPI and exam findings today, a serious infection or illness is unlikely. We discussed potential etiologies, with VURI being most likely and his symptoms seem to be resolving, and advised supportive care and monitoring. Cough medication risks and proper use discussed. Advised to quit smoking. We discussed treatment side effects, likely course, antibiotic misuse, transmission, and signs of developing a serious or worsening illness. -of course, we advised to return or notify a doctor immediately if symptoms worsen or persist or new concerns arise.    Patient Instructions  INSTRUCTIONS FOR UPPER RESPIRATORY INFECTION:  -plenty of rest and fluids  -nasal saline wash 2-3 times daily (use prepackaged nasal saline or bottled/distilled water if making your own)   -can use AFRIN nasal spray for drainage and nasal congestion - but do NOT use longer then 3-4 days  -can use tylenol (in no history of liver disease) or ibuprofen (if no history of kidney disease, bowel bleeding or significant heart disease) as directed for aches and sorethroat  -in the winter time, using a humidifier at night is helpful (please follow cleaning instructions)  -if you are taking a cough medication - use only as directed, may also try a teaspoon of honey to coat the throat and throat lozenges. If given a cough medication with codeine or hydrocodone or other narcotic please be advised that this contains a strong and  potentially addicting medication. Please follow instructions carefully, take as little as possible and only use AS NEEDED for severe cough. Discuss potential side effects with your pharmacy. Please do not drive or operate machinery while taking these types of medications. Please do not take other sedating medications, drugs or alcohol while taking this medication without discussing with your doctor.  -for sore throat, salt water gargles can help  -follow up if you have  fevers, facial pain, tooth pain, difficulty breathing or are worsening or symptoms persist longer then expected  Upper Respiratory Infection, Adult An upper respiratory infection (URI) is also known as the common cold. It is often caused by a type of germ (virus). Colds are easily spread (contagious). You can pass it to others by kissing, coughing, sneezing, or drinking out of the same glass. Usually, you get better in 1 to 3  weeks.  However, the cough can last for even longer. HOME CARE   Only take medicine as told by your doctor. Follow instructions provided above.  Drink enough water and fluids to keep your pee (urine) clear or pale yellow.  Get plenty of rest.  Return to work when your temperature is < 100 for 24 hours or as told by your doctor. You may use a face mask and wash your hands to stop your cold from spreading. GET HELP  RIGHT AWAY IF:   After the first few days, you feel you are getting worse.  You have questions about your medicine.  You have chills, shortness of breath, or red spit (mucus).  You have pain in the face for more then 1-2 days, especially when you bend forward.  You have a fever, puffy (swollen) neck, pain when you swallow, or white spots in the back of your throat.  You have a bad headache, ear pain, sinus pain, or chest pain.  You have a high-pitched whistling sound when you breathe in and out (wheezing).  You cough up blood.  You have sore muscles or a stiff neck. MAKE SURE YOU:   Understand these instructions.  Will watch your condition.  Will get help right away if you are not doing well or get worse. Document Released: 07/18/2007 Document Revised: 04/23/2011 Document Reviewed: 05/06/2013 [facility] Patient Information 2015 [place], [place]. This information is not intended to replace advice given to you by your health care provider. Make sure you discuss any questions you have with your health care provider.    Colin Benton R., DO

## 2016-02-02 NOTE — Patient Instructions (Signed)

## 2016-02-02 NOTE — Progress Notes (Signed)
Pre visit review using our clinic review tool, if applicable. No additional management support is needed unless otherwise documented below in the visit note. 

## 2016-03-19 ENCOUNTER — Encounter: Payer: Self-pay | Admitting: Internal Medicine

## 2016-03-19 ENCOUNTER — Ambulatory Visit (INDEPENDENT_AMBULATORY_CARE_PROVIDER_SITE_OTHER): Payer: 59 | Admitting: Internal Medicine

## 2016-03-19 VITALS — BP 148/82 | HR 72 | Temp 98.2°F | Ht 73.0 in | Wt 377.2 lb

## 2016-03-19 DIAGNOSIS — I10 Essential (primary) hypertension: Secondary | ICD-10-CM

## 2016-03-19 DIAGNOSIS — M25562 Pain in left knee: Secondary | ICD-10-CM

## 2016-03-19 MED ORDER — MELOXICAM 15 MG PO TABS: 15.0000 mg | ORAL_TABLET | Freq: Every day | ORAL | 0 refills | Status: DC

## 2016-03-19 MED ORDER — HYDROCODONE-ACETAMINOPHEN 5-325 MG PO TABS: 1.0000 | ORAL_TABLET | Freq: Four times a day (QID) | ORAL | 0 refills | Status: DC | PRN

## 2016-03-19 NOTE — Progress Notes (Signed)
Subjective:    Patient ID: Justin Norman, male    DOB: December 04, 1970, 46 y.o.   MRN: [id number]  HPI  46 year old patient who has a history of exogenous obesity.  He is status post arthroscopic surgery for a right meniscal tear in October 2015.  He presents today with a 2 to three-week history of increasing left knee pain.  He has been using Advil and a bedtime dose of hydrocodone cough elixir with some benefit.  He is using a brace.  He continues to have nocturnal pain. 3 days ago he stepped on a uneven surface and hyperextended his left knee with increasing pain.  Past Medical History:  Diagnosis Date  . Anxiety    during a divorce  . Dry skin   . ERECTILE DYSFUNCTION 03/03/2007  . GERD (gastroesophageal reflux disease)    not taking meds  . Headache    headaches  . HYPERTENSION 07/03/2007  . HYPERTENSION NEC 03/03/2007  . Kidney stone   . Obesities, morbid ([place])   . Sleep apnea 09/2012   does not use Cpap  . Tear of medial meniscus of right knee 11/20/2013     Social History   Social History  . Marital status: Divorced    Spouse name: N/A  . Number of children: 2  . Years of education: N/A   Occupational History  .  [employer]   Social History Main Topics  . Smoking status: Current Every Day Smoker    Packs/day: 0.50    Years: 15.00    Types: Cigarettes  . Smokeless tobacco: Never Used  . Alcohol use Yes     Comment: occasional  . Drug use: No  . Sexual activity: Not on file   Other Topics Concern  . Not on file   Social History Narrative  . No narrative on file    Past Surgical History:  Procedure Laterality Date  . KNEE ARTHROSCOPY Right 11/20/2013   Procedure: RIGHT KNEE ARTHROSCOPY WITH MEDIAL MENISCECTOMY ;  Surgeon: Johnny Bridge, MD;  Location: [place];  Service: Orthopedics;  Laterality: Right;  . vocal cord abscess surgery      Family History  Problem Relation Age of Onset  . Hypertension Mother   . Arthritis Mother   . Diabetes Mother    . Diabetes Sister   . Diabetes Sister   . Hypertension Sister   . Arrhythmia Sister     has pacemaker    No Known Allergies  Current Outpatient Prescriptions on File Prior to Visit  Medication Sig Dispense Refill  . furosemide (LASIX) 40 MG tablet Take 1 tablet (40 mg total) by mouth daily. 30 tablet 5  . HYDROcodone-homatropine (HYCODAN) 5-1.5 MG/5ML syrup Take 5 mLs by mouth at bedtime as needed for cough. 120 mL 0  . ibuprofen (ADVIL,MOTRIN) 200 MG tablet Take 200 mg by mouth every 6 (six) hours as needed for mild pain.    [name] lisinopril-hydrochlorothiazide (PRINZIDE,ZESTORETIC) 20-25 MG tablet Take 1 tablet by mouth daily. 30 tablet 5   No current facility-administered medications on file prior to visit.     BP (!) 148/82 (BP Location: Right Arm, Patient Position: Sitting, Cuff Size: Large)   Pulse 72   Temp 98.2 F (36.8 C) (Oral)   Ht 6\' 1"  (1.854 m)   Wt (!) 377 lb 3.2 oz (171.1 kg)   SpO2 96%   BMI 49.77 kg/m     Review of Systems  Constitutional: Negative for appetite change, chills, fatigue and  fever.  HENT: Negative for congestion, dental problem, ear pain, hearing loss, sore throat, tinnitus, trouble swallowing and voice change.   Eyes: Negative for pain, discharge and visual disturbance.  Respiratory: Negative for cough, chest tightness, wheezing and stridor.   Cardiovascular: Negative for chest pain, palpitations and leg swelling.  Gastrointestinal: Negative for abdominal distention, abdominal pain, blood in stool, constipation, diarrhea, nausea and vomiting.  Genitourinary: Negative for difficulty urinating, discharge, flank pain, genital sores, hematuria and urgency.  Musculoskeletal: Positive for arthralgias, gait problem and joint swelling. Negative for back pain, myalgias and neck stiffness.  Skin: Negative for rash.  Neurological: Negative for dizziness, syncope, speech difficulty, weakness, numbness and headaches.  Hematological: Negative for adenopathy.  Does not bruise/bleed easily.  Psychiatric/Behavioral: Negative for behavioral problems and dysphoric mood. The patient is not nervous/anxious.        Objective:   Physical Exam  Constitutional:  Weight 3 7 7  Blood pressure 150/80  Musculoskeletal:  Left knee is mildly swollen and warm to touch          Assessment & Plan:   Left knee pain and effusion.  Probable meniscal tear.  Will treat with the [place] short-term.  Prescription for hydrocodone dispensed.  Will schedule orthopedic consultation Exogenous obesity.  Weight loss encouraged  Preventative health.  Annual exam scheduled  [name]

## 2016-03-19 NOTE — Patient Instructions (Signed)
Orthopedic follow-up  Limit your sodium (Salt) intake  Please check your blood pressure on a regular basis.  If it is consistently greater than 150/90, please make an office appointment.  You need to lose weight.  Consider a lower calorie diet and regular exercise.  Return in 4 months for follow-up for Annual exam

## 2016-03-19 NOTE — Progress Notes (Signed)
Pre visit review using our clinic review tool, if applicable. No additional management support is needed unless otherwise documented below in the visit note. 

## 2016-04-15 ENCOUNTER — Other Ambulatory Visit: Payer: Self-pay | Admitting: Internal Medicine

## 2016-05-22 ENCOUNTER — Ambulatory Visit: Payer: 59 | Admitting: Adult Health

## 2016-06-21 ENCOUNTER — Ambulatory Visit (INDEPENDENT_AMBULATORY_CARE_PROVIDER_SITE_OTHER): Payer: 59 | Admitting: Family Medicine

## 2016-06-21 ENCOUNTER — Encounter: Payer: Self-pay | Admitting: Family Medicine

## 2016-06-21 VITALS — BP 120/90 | HR 78 | Temp 98.2°F | Ht 73.0 in | Wt 367.4 lb

## 2016-06-21 DIAGNOSIS — J40 Bronchitis, not specified as acute or chronic: Secondary | ICD-10-CM

## 2016-06-21 DIAGNOSIS — J988 Other specified respiratory disorders: Secondary | ICD-10-CM | POA: Diagnosis not present

## 2016-06-21 MED ORDER — PREDNISONE 20 MG PO TABS: 40.0000 mg | ORAL_TABLET | Freq: Every day | ORAL | 0 refills | Status: DC

## 2016-06-21 MED ORDER — AZITHROMYCIN 250 MG PO TABS: ORAL_TABLET | ORAL | 0 refills | Status: DC

## 2016-06-21 MED ORDER — ALBUTEROL SULFATE HFA 108 (90 BASE) MCG/ACT IN AERS: 2.0000 | INHALATION_SPRAY | Freq: Four times a day (QID) | RESPIRATORY_TRACT | 0 refills | Status: DC | PRN

## 2016-06-21 NOTE — Progress Notes (Signed)
HPI:   Acute visit for cough: -reports started 3-4 weeks ago -symptoms: productive cough (white and yellow mucus), PND, wheezing at times -denies: SOB, DOE, CP, fevers, malaise, sinus pain or pressure -hx smoking 1 pack per week, OSA on CPA -reports recurrent bronchitis - he was very happy with the medications his PCP rxd for this in the past  ROS: See pertinent positives and negatives per HPI.  Past Medical History:  Diagnosis Date  . Anxiety    during a divorce  . Dry skin   . ERECTILE DYSFUNCTION 03/03/2007  . GERD (gastroesophageal reflux disease)    not taking meds  . Headache    headaches  . HYPERTENSION 07/03/2007  . HYPERTENSION NEC 03/03/2007  . Kidney stone   . Obesities, morbid ([place])   . Sleep apnea 09/2012   does not use Cpap  . Tear of medial meniscus of right knee 11/20/2013    Past Surgical History:  Procedure Laterality Date  . KNEE ARTHROSCOPY Right 11/20/2013   Procedure: RIGHT KNEE ARTHROSCOPY WITH MEDIAL MENISCECTOMY ;  Surgeon: Johnny Bridge, MD;  Location: [facility];  Service: Orthopedics;  Laterality: Right;  . vocal cord abscess surgery      Family History  Problem Relation Age of Onset  . Hypertension Mother   . Arthritis Mother   . Diabetes Mother   . Diabetes Sister   . Diabetes Sister   . Hypertension Sister   . Arrhythmia Sister        has pacemaker    Social History   Social History  . Marital status: Divorced    Spouse name: N/A  . Number of children: 2  . Years of education: N/A   Occupational History  .  [employer]   Social History Main Topics  . Smoking status: Current Every Day Smoker    Packs/day: 0.50    Years: 15.00    Types: Cigarettes  . Smokeless tobacco: Never Used  . Alcohol use Yes     Comment: occasional  . Drug use: No  . Sexual activity: Not Asked   Other Topics Concern  . None   Social History Narrative  . None     Current Outpatient Prescriptions:  .  furosemide (LASIX) 40 MG tablet, Take  1 tablet (40 mg total) by mouth daily., Disp: 30 tablet, Rfl: 5 .  HYDROcodone-acetaminophen (NORCO/VICODIN) 5-325 MG tablet, Take 1 tablet by mouth every 6 (six) hours as needed for moderate pain., Disp: 30 tablet, Rfl: 0 .  ibuprofen (ADVIL,MOTRIN) 200 MG tablet, Take 200 mg by mouth every 6 (six) hours as needed for mild pain., Disp: , Rfl:  .  lisinopril-hydrochlorothiazide (PRINZIDE,ZESTORETIC) 20-25 MG tablet, Take 1 tablet by mouth daily., Disp: 30 tablet, Rfl: 5 .  meloxicam (MOBIC) 15 MG tablet, TAKE 1 TABLET EVERY DAY, Disp: 30 tablet, Rfl: 0 .  albuterol (PROVENTIL HFA;VENTOLIN HFA) 108 (90 Base) MCG/ACT inhaler, Inhale 2 puffs into the lungs every 6 (six) hours as needed., Disp: 1 Inhaler, Rfl: 0 .  azithromycin (ZITHROMAX) 250 MG tablet, 2 tabs day 1, then one tab daily, Disp: 6 tablet, Rfl: 0 .  predniSONE (DELTASONE) 20 MG tablet, Take 2 tablets (40 mg total) by mouth daily with breakfast., Disp: 8 tablet, Rfl: 0  EXAM:  Vitals:   06/21/16 1643  BP: 120/90  Pulse: 78  Temp: 98.2 F (36.8 C)    Body mass index is 48.47 kg/m.  GENERAL: vitals reviewed and listed above, alert, oriented, appears  well hydrated and in no acute distress  HEENT: atraumatic, conjunttiva clear, no obvious abnormalities on inspection of external nose and ears, normal appearance of ear canals and TMs, clear nasal congestion, mild post oropharyngeal erythema with PND, no tonsillar edema or exudate, no sinus TTP  NECK: no obvious masses on inspection  LUNGS: prolonged exp phase with scattered wheeze  CV: HRRR, no peripheral edema  MS: moves all extremities without noticeable abnormality  PSYCH: pleasant and cooperative, no obvious depression or anxiety  ASSESSMENT AND PLAN:  Discussed the following assessment and plan:  Bronchitis  Respiratory infection  -azithro given duration and discolored sputum -prednisone for bronchitis - likely underlying COPD  -alb prn -advised to quit smoking  and counseled on risks, reasons to quit -due for PCP follow up next month per prior notes -Patient advised to return or notify a doctor immediately if symptoms worsen or persist or new concerns arise.  Patient Instructions  BEFORE YOU LEAVE: -follow up: due for regular follow up with your doctor in 1 month  Take the prednisone and the azithromycin as prescribe. Use the albuterol as needed.  Please quit smoking!  I hope you are feeling better soon! Seek care immediately if worsening, new concerns or you are not improving with treatment.     Colin Benton R., DO

## 2016-06-21 NOTE — Patient Instructions (Signed)
BEFORE YOU LEAVE: -follow up: due for regular follow up with your doctor in 1 month  Take the prednisone and the azithromycin as prescribe. Use the albuterol as needed.  Please quit smoking!  I hope you are feeling better soon! Seek care immediately if worsening, new concerns or you are not improving with treatment.

## 2016-07-30 ENCOUNTER — Ambulatory Visit (INDEPENDENT_AMBULATORY_CARE_PROVIDER_SITE_OTHER): Payer: 59 | Admitting: Internal Medicine

## 2016-07-30 ENCOUNTER — Encounter: Payer: Self-pay | Admitting: Internal Medicine

## 2016-07-30 VITALS — BP 148/90 | HR 89 | Temp 98.6°F | Ht 73.0 in | Wt 363.4 lb

## 2016-07-30 DIAGNOSIS — Z0000 Encounter for general adult medical examination without abnormal findings: Secondary | ICD-10-CM

## 2016-07-30 LAB — COMPREHENSIVE METABOLIC PANEL
ALT: 32 U/L (ref 0–53)
AST: 22 U/L (ref 0–37)
Albumin: 4.2 g/dL (ref 3.5–5.2)
Alkaline Phosphatase: 40 U/L (ref 39–117)
BILIRUBIN TOTAL: 0.6 mg/dL (ref 0.2–1.2)
BUN: 15 mg/dL (ref 6–23)
CO2: 31 meq/L (ref 19–32)
Calcium: 9.7 mg/dL (ref 8.4–10.5)
Chloride: 104 mEq/L (ref 96–112)
Creatinine, Ser: 1.08 mg/dL (ref 0.40–1.50)
GFR: 94.35 mL/min (ref >60.00)
GLUCOSE: 104 mg/dL — AB (ref 70–99)
Potassium: 4.5 mEq/L (ref 3.5–5.1)
Sodium: 140 mEq/L (ref 135–145)
TOTAL PROTEIN: 7.3 g/dL (ref 6.0–8.3)

## 2016-07-30 LAB — CBC WITH DIFFERENTIAL/PLATELET
BASOS ABS: 0 10*3/uL (ref 0.0–0.1)
Basophils Relative: 0.5 % (ref 0.0–3.0)
Eosinophils Absolute: 0.2 10*3/uL (ref 0.0–0.7)
Eosinophils Relative: 3.7 % (ref 0.0–5.0)
HCT: 49.6 % (ref 39.0–52.0)
Hemoglobin: 16.5 g/dL (ref 13.0–17.0)
LYMPHS ABS: 1.5 10*3/uL (ref 0.7–4.0)
Lymphocytes Relative: 30.4 % (ref 12.0–46.0)
MCHC: 33.3 g/dL (ref 30.0–36.0)
MCV: 92.7 fl (ref 78.0–100.0)
MONO ABS: 0.4 10*3/uL (ref 0.1–1.0)
Monocytes Relative: 8.3 % (ref 3.0–12.0)
NEUTROS ABS: 2.9 10*3/uL (ref 1.4–7.7)
NEUTROS PCT: 57.1 % (ref 43.0–77.0)
PLATELETS: 217 10*3/uL (ref 150.0–400.0)
RBC: 5.35 Mil/uL (ref 4.22–5.81)
RDW: 14.1 % (ref 11.5–15.5)
WBC: 5.1 10*3/uL (ref 4.0–10.5)

## 2016-07-30 LAB — LIPID PANEL
CHOL/HDL RATIO: 3
Cholesterol: 204 mg/dL — ABNORMAL HIGH (ref 0–200)
HDL: 69.4 mg/dL (ref >39.00)
LDL Cholesterol: 121 mg/dL — ABNORMAL HIGH (ref 0–99)
NONHDL: 134.39
Triglycerides: 69 mg/dL (ref 0.0–149.0)
VLDL: 13.8 mg/dL (ref 0.0–40.0)

## 2016-07-30 LAB — HEMOGLOBIN A1C: HEMOGLOBIN A1C: 6.1 % (ref 4.6–6.5)

## 2016-07-30 LAB — TSH: TSH: 1.36 u[IU]/mL (ref 0.35–4.50)

## 2016-07-30 NOTE — Progress Notes (Signed)
Subjective:    Patient ID: Justin Norman, male    DOB: Jun 10, 1970, 46 y.o.   MRN: [id number]  HPI 46 year old patient who is seen today for a preventive health examination Medical problems include exogenous obesity.  He has OSA and is followed by pulmonary medicine. He is also followed by orthopedics for knee pain.  He is had a recent arthrocentesis for a left knee effusion. He has history of ED and is requesting samples today. Additionally, he has a history of ongoing tobacco use as well .  Essential hypertension. There has been some weight loss of a proximally 40 pounds over the past 2 years  BP Readings from Last 3 Encounters:  07/30/16 (!) 148/90  06/21/16 120/90  03/19/16 (!) 148/82    Past Medical History:  Diagnosis Date  . Anxiety    during a divorce  . Dry skin   . ERECTILE DYSFUNCTION 03/03/2007  . GERD (gastroesophageal reflux disease)    not taking meds  . Headache    headaches  . HYPERTENSION 07/03/2007  . HYPERTENSION NEC 03/03/2007  . Kidney stone   . 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 Sleep apnea 09/2012   does not use Cpap  . Tear of medial meniscus of right knee 11/20/2013     Social History   Social History  . Marital status: Divorced    Spouse name: N/A  . Number of children: 2  . Years of education: N/A   Occupational History  .  [employer]   Social History Main Topics  . Smoking status: Current Every Day Smoker    Packs/day: 0.50    Years: 15.00    Types: Cigarettes  . Smokeless tobacco: Never Used  . Alcohol use Yes     Comment: occasional  . Drug use: No  . Sexual activity: Not on file   Other Topics Concern  . Not on file   Social History Narrative  . No narrative on file    Past Surgical History:  Procedure Laterality Date  . KNEE ARTHROSCOPY Right 11/20/2013   Procedure: RIGHT KNEE ARTHROSCOPY WITH MEDIAL MENISCECTOMY ;  Surgeon: Johnny Bridge, MD;  Location: [facility];  Service: Orthopedics;  Laterality: Right;  . vocal cord  abscess surgery      Family History  Problem Relation Age of Onset  . Hypertension Mother   . Arthritis Mother   . Diabetes Mother   . Diabetes Sister   . Diabetes Sister   . Hypertension Sister   . Arrhythmia Sister        has pacemaker    No Known Allergies  Current Outpatient Prescriptions on File Prior to Visit  Medication Sig Dispense Refill  . ibuprofen (ADVIL,MOTRIN) 200 MG tablet Take 200 mg by mouth every 6 (six) hours as needed for mild pain.    [name] lisinopril-hydrochlorothiazide (PRINZIDE,ZESTORETIC) 20-25 MG tablet Take 1 tablet by mouth daily. 30 tablet 5  . meloxicam (MOBIC) 15 MG tablet TAKE 1 TABLET EVERY DAY 30 tablet 0  . HYDROcodone-acetaminophen (NORCO/VICODIN) 5-325 MG tablet Take 1 tablet by mouth every 6 (six) hours as needed for moderate pain. (Patient not taking: Reported on 07/30/2016) 30 tablet 0   No current facility-administered medications on file prior to visit.     BP (!) 148/90 (BP Location: Left Arm, Patient Position: Sitting, Cuff Size: Normal)   Pulse 89   Temp 98.6 F (37 C) (Oral)   Ht 6\' 1"  (1.854 m)   Wt (!) 363 lb  6.4 oz (164.8 kg)   SpO2 95%   BMI 47.94 kg/m      Review of Systems  Constitutional: Negative for activity change, appetite change, chills, fatigue and fever.  HENT: Negative for congestion, dental problem, ear pain, hearing loss, mouth sores, rhinorrhea, sinus pressure, sneezing, tinnitus, trouble swallowing and voice change.   Eyes: Negative for photophobia, pain, redness and visual disturbance.  Respiratory: Negative for apnea, cough, choking, chest tightness, shortness of breath and wheezing.   Cardiovascular: Negative for chest pain, palpitations and leg swelling.  Gastrointestinal: Negative for abdominal distention, abdominal pain, anal bleeding, blood in stool, constipation, diarrhea, nausea, rectal pain and vomiting.  Genitourinary: Negative for decreased urine volume, difficulty urinating, discharge, dysuria,  flank pain, frequency, genital sores, hematuria, penile swelling, scrotal swelling, testicular pain and urgency.  Musculoskeletal: Positive for arthralgias. Negative for back pain, gait problem, joint swelling, myalgias, neck pain and neck stiffness.  Skin: Negative for color change, rash and wound.  Neurological: Negative for dizziness, tremors, seizures, syncope, facial asymmetry, speech difficulty, weakness, light-headedness, numbness and headaches.  Hematological: Negative for adenopathy. Does not bruise/bleed easily.  Psychiatric/Behavioral: Negative for agitation, behavioral problems, confusion, decreased concentration, dysphoric mood, hallucinations, self-injury, sleep disturbance and suicidal ideas. The patient is not nervous/anxious.        Objective:   Physical Exam  Constitutional: He appears well-developed and well-nourished.  Weight the 3 6 3  Blood pressure 140/84  HENT:  Head: Normocephalic and atraumatic.  Right Ear: External ear normal.  Left Ear: External ear normal.  Nose: Nose normal.  Mouth/Throat: Oropharynx is clear and moist.  Eyes: Conjunctivae and EOM are normal. Pupils are equal, round, and reactive to light. No scleral icterus.  Neck: Normal range of motion. Neck supple. No JVD present. No thyromegaly present.  Cardiovascular: Regular rhythm, normal heart sounds and intact distal pulses.  Exam reveals no gallop and no friction rub.   No murmur heard. Pulmonary/Chest: Effort normal and breath sounds normal. He exhibits no tenderness.  Abdominal: Soft. Bowel sounds are normal. He exhibits no distension and no mass. There is no tenderness.  Genitourinary: Prostate normal and penis normal.  Musculoskeletal: Normal range of motion. He exhibits edema. He exhibits no tenderness.  Lymphadenopathy:    He has no cervical adenopathy.  Neurological: He is alert. He has normal reflexes. No cranial nerve deficit. Coordination normal.  Skin: Skin is warm and dry. No rash  noted.  Psychiatric: He has a normal mood and affect. His behavior is normal.          Assessment & Plan:   Preventive health exam Exogenous obesity/OSA.  Weight loss encouraged.  Follow-up pulmonary medicine Essential hypertension, stable no change in therapy Impaired glucose tolerance/family history of diabetes  Ongoing tobacco use.  Total smoking cessation encouraged  Follow-up 6 months Laboratory update reviewed  [name]

## 2016-07-30 NOTE — Patient Instructions (Addendum)
WE NOW OFFER   [facility] FAST TRACK!!!  SAME DAY Appointments for ACUTE CARE  Such as: Sprains, Injuries, cuts, abrasions, rashes, muscle pain, joint pain, back pain Colds, flu, sore throats, headache, allergies, cough, fever  Ear pain, sinus and eye infections Abdominal pain, nausea, vomiting, diarrhea, upset stomach Animal/insect bites  3 Easy Ways to Schedule: Walk-In Scheduling Call in scheduling Mychart Sign-up: [email or web address]   Limit your sodium (Salt) intake  Please check your blood pressure on a regular basis.  If it is consistently greater than 150/90, please make an office appointment.    It is important that you exercise regularly, at least 20 minutes 3 to 4 times per week.  If you develop chest pain or shortness of breath seek  medical attention.  You need to lose weight.  Consider a lower calorie diet and regular exercise.  Smoking tobacco is very bad for your health. You should stop smoking immediately.  Return in 6 months for follow-up

## 2016-08-03 ENCOUNTER — Other Ambulatory Visit: Payer: Self-pay | Admitting: Internal Medicine

## 2016-10-18 ENCOUNTER — Ambulatory Visit (INDEPENDENT_AMBULATORY_CARE_PROVIDER_SITE_OTHER): Payer: 59 | Admitting: Adult Health

## 2016-10-18 VITALS — BP 166/100 | HR 68 | Temp 98.4°F | Wt 363.0 lb

## 2016-10-18 DIAGNOSIS — J069 Acute upper respiratory infection, unspecified: Secondary | ICD-10-CM

## 2016-10-18 MED ORDER — AZITHROMYCIN 250 MG PO TABS: ORAL_TABLET | ORAL | 0 refills | Status: DC

## 2016-10-18 MED ORDER — IPRATROPIUM-ALBUTEROL 0.5-2.5 (3) MG/3ML IN SOLN: 3.0000 mL | Freq: Once | RESPIRATORY_TRACT | Status: DC

## 2016-10-18 MED ORDER — PREDNISONE 10 MG PO TABS: ORAL_TABLET | ORAL | 0 refills | Status: DC

## 2016-10-18 NOTE — Progress Notes (Signed)
Subjective:    Patient ID: Justin Norman, male    DOB: 13-Oct-1970, 45 y.o.   MRN: [id number]  HPI  46 year old male who  has a past medical history of Anxiety; Dry skin; ERECTILE DYSFUNCTION (03/03/2007); GERD (gastroesophageal reflux disease); Headache; HYPERTENSION (07/03/2007); HYPERTENSION NEC (03/03/2007); Kidney stone; Obesities, morbid ([place]); Sleep apnea (09/2012); and Tear of medial meniscus of right knee (11/20/2013). He is a patient of Dr. Raliegh Ip who I am seeing today for an acute issue of 2-3 weeks of URI like symptoms. He reports that his symptoms include productive cough, SOB, and wheezing.   He denies any fevers or feeling ill   He continues to smoke    Review of Systems See HPI   Past Medical History:  Diagnosis Date  . Anxiety    during a divorce  . Dry skin   . ERECTILE DYSFUNCTION 03/03/2007  . GERD (gastroesophageal reflux disease)    not taking meds  . Headache    headaches  . HYPERTENSION 07/03/2007  . HYPERTENSION NEC 03/03/2007  . Kidney stone   . Obesities, morbid ([place])   . Sleep apnea 09/2012   does not use Cpap  . Tear of medial meniscus of right knee 11/20/2013    Social History   Social History  . Marital status: Divorced    Spouse name: N/A  . Number of children: 2  . Years of education: N/A   Occupational History  .  [employer]   Social History Main Topics  . Smoking status: Current Every Day Smoker    Packs/day: 0.50    Years: 15.00    Types: Cigarettes  . Smokeless tobacco: Never Used  . Alcohol use Yes     Comment: occasional  . Drug use: No  . Sexual activity: Not on file   Other Topics Concern  . Not on file   Social History Narrative  . No narrative on file    Past Surgical History:  Procedure Laterality Date  . KNEE ARTHROSCOPY Right 11/20/2013   Procedure: RIGHT KNEE ARTHROSCOPY WITH MEDIAL MENISCECTOMY ;  Surgeon: Johnny Bridge, MD;  Location: [facility];  Service: Orthopedics;  Laterality: Right;  . vocal cord  abscess surgery      Family History  Problem Relation Age of Onset  . Hypertension Mother   . Arthritis Mother   . Diabetes Mother   . Diabetes Sister   . Diabetes Sister   . Hypertension Sister   . Arrhythmia Sister        has pacemaker    No Known Allergies  Current Outpatient Prescriptions on File Prior to Visit  Medication Sig Dispense Refill  . lisinopril-hydrochlorothiazide (PRINZIDE,ZESTORETIC) 20-25 MG tablet Take 1 tablet by mouth daily. 30 tablet 5  . meloxicam (MOBIC) 15 MG tablet TAKE 1 TABLET EVERY DAY 30 tablet 0  . HYDROcodone-acetaminophen (NORCO/VICODIN) 5-325 MG tablet Take 1 tablet by mouth every 6 (six) hours as needed for moderate pain. (Patient not taking: Reported on 07/30/2016) 30 tablet 0  . ibuprofen (ADVIL,MOTRIN) 200 MG tablet Take 200 mg by mouth every 6 (six) hours as needed for mild pain.     No current facility-administered medications on file prior to visit.     BP (!) 166/100 (BP Location: Right Arm)   Pulse 68   Temp 98.4 F (36.9 C) (Oral)   Wt (!) 363 lb (164.7 kg)   SpO2 97%   BMI 47.89 kg/m       Objective:  Physical Exam  Constitutional: He is oriented to person, place, and time. He appears well-developed and well-nourished. No distress.  Cardiovascular: Normal rate, regular rhythm, normal heart sounds and intact distal pulses.   Pulmonary/Chest: Effort normal. He has wheezes. He has rhonchi. He has no rales. He exhibits no tenderness.  Neurological: He is alert and oriented to person, place, and time.  Skin: Skin is warm and dry. No rash noted. He is not diaphoretic. No erythema. No pallor.  Psychiatric: He has a normal mood and affect. His behavior is normal. Thought content normal.  Nursing note and vitals reviewed.     Assessment & Plan:  1. Upper respiratory tract infection, unspecified type - Encouraged to quit smoking and talk to PCP about chantix  - predniSONE (DELTASONE) 10 MG tablet; 40 mg x 3 days, 20 mg x 3 days,  10 mg x 3 days  Dispense: 21 tablet; Refill: 0 - azithromycin (ZITHROMAX Z-PAK) 250 MG tablet; Take 2 tablets on Day 1.  Then take 1 tablet daily.  Dispense: 6 tablet; Refill: 0 - ipratropium-albuterol (DUONEB) 0.5-2.5 (3) MG/3ML nebulizer solution 3 mL; Take 3 mLs by nebulization once. - After nebulizer wheezing and rhonchi had improved. Patient reported feeling as though he could breath easier   Dorothyann Peng, NP

## 2016-11-01 ENCOUNTER — Encounter: Payer: Self-pay | Admitting: Internal Medicine

## 2016-12-27 ENCOUNTER — Telehealth: Payer: Self-pay | Admitting: Internal Medicine

## 2016-12-27 ENCOUNTER — Other Ambulatory Visit: Payer: Self-pay | Admitting: Internal Medicine

## 2016-12-27 NOTE — Telephone Encounter (Signed)
Please advise 

## 2016-12-27 NOTE — Telephone Encounter (Signed)
Copied from [place] [id number]. Topic: General - Other >> Dec 27, 2016 10:46 AM Scherrie Gerlach wrote: Pt states given samples of Stendra 100 mg.  Pt is requesting a Rx for this med.  [id number] - [place], [place] - [address]

## 2016-12-27 NOTE — Telephone Encounter (Signed)
Okay for refill #20 Refill x6

## 2016-12-27 NOTE — Telephone Encounter (Signed)
1 tablet daily as needed for ED not to exceed 1 tablet/day

## 2016-12-27 NOTE — Telephone Encounter (Signed)
Please clarify  frequency & dosage.  Thank you  Justin Norman

## 2016-12-28 MED ORDER — AVANAFIL 100 MG PO TABS: 1.0000 | ORAL_TABLET | Freq: Every day | ORAL | 6 refills | Status: DC | PRN

## 2016-12-28 NOTE — Addendum Note (Signed)
Addended by: Dorrene German on: 12/28/2016 02:59 PM   Modules accepted: Orders

## 2016-12-28 NOTE — Telephone Encounter (Signed)
Medication filled to pharmacy as requested.   

## 2017-01-23 ENCOUNTER — Other Ambulatory Visit: Payer: Self-pay | Admitting: Internal Medicine

## 2017-02-27 ENCOUNTER — Encounter: Payer: Self-pay | Admitting: Adult Health

## 2017-02-27 ENCOUNTER — Ambulatory Visit (INDEPENDENT_AMBULATORY_CARE_PROVIDER_SITE_OTHER): Payer: 59 | Admitting: Adult Health

## 2017-02-27 VITALS — BP 160/90 | Temp 99.1°F | Ht 73.0 in | Wt 368.4 lb

## 2017-02-27 DIAGNOSIS — J069 Acute upper respiratory infection, unspecified: Secondary | ICD-10-CM

## 2017-02-27 MED ORDER — DOXYCYCLINE HYCLATE 100 MG PO CAPS
100.0000 mg | ORAL_CAPSULE | Freq: Two times a day (BID) | ORAL | 0 refills | Status: DC
Start: 2017-02-27 — End: 2018-07-15

## 2017-02-27 MED ORDER — PREDNISONE 10 MG PO TABS: ORAL_TABLET | ORAL | 0 refills | Status: DC

## 2017-02-27 MED ORDER — ALBUTEROL SULFATE HFA 108 (90 BASE) MCG/ACT IN AERS: 2.0000 | INHALATION_SPRAY | Freq: Four times a day (QID) | RESPIRATORY_TRACT | 2 refills | Status: DC | PRN

## 2017-02-27 NOTE — Progress Notes (Signed)
Subjective:    Patient ID: Justin Norman, male    DOB: 08-10-1970, 47 y.o.   MRN: [id number]  URI   This is a recurrent problem. The current episode started in the past 7 days. The problem has been gradually worsening. Maximum temperature: subjective low grade  Associated symptoms include congestion, coughing (productive ), headaches, rhinorrhea, a sore throat and wheezing. Pertinent negatives include no diarrhea, ear pain, nausea or sinus pain. Treatments tried: Robatussin  The treatment provided mild relief.   He continues to smoke    Review of Systems  Constitutional: Positive for diaphoresis and fever. Negative for activity change, chills and fatigue.  HENT: Positive for congestion, postnasal drip, rhinorrhea and sore throat. Negative for ear pain, sinus pressure and sinus pain.   Respiratory: Positive for cough (productive ), chest tightness and wheezing. Negative for shortness of breath and stridor.   Cardiovascular: Negative.   Gastrointestinal: Negative for diarrhea and nausea.  Neurological: Positive for headaches.   Past Medical History:  Diagnosis Date  . Anxiety    during a divorce  . Dry skin   . ERECTILE DYSFUNCTION 03/03/2007  . GERD (gastroesophageal reflux disease)    not taking meds  . Headache    headaches  . HYPERTENSION 07/03/2007  . HYPERTENSION NEC 03/03/2007  . Kidney stone   . Obesities, morbid ([place])   . Sleep apnea 09/2012   does not use Cpap  . Tear of medial meniscus of right knee 11/20/2013    Social History   Socioeconomic History  . Marital status: Divorced    Spouse name: Not on file  . Number of children: 2  . Years of education: Not on file  . Highest education level: Not on file  Social Needs  . Financial resource strain: Not on file  . Food insecurity - worry: Not on file  . Food insecurity - inability: Not on file  . Transportation needs - medical: Not on file  . Transportation needs - non-medical: Not on file  Occupational  History    Employer: [employer]  Tobacco Use  . Smoking status: Current Every Day Smoker    Packs/day: 0.50    Years: 15.00    Pack years: 7.50    Types: Cigarettes  . Smokeless tobacco: Never Used  Substance and Sexual Activity  . Alcohol use: Yes    Comment: occasional  . Drug use: No  . Sexual activity: Not on file  Other Topics Concern  . Not on file  Social History Narrative  . Not on file    Past Surgical History:  Procedure Laterality Date  . KNEE ARTHROSCOPY Right 11/20/2013   Procedure: RIGHT KNEE ARTHROSCOPY WITH MEDIAL MENISCECTOMY ;  Surgeon: Johnny Bridge, MD;  Location: [facility];  Service: Orthopedics;  Laterality: Right;  . vocal cord abscess surgery      Family History  Problem Relation Age of Onset  . Hypertension Mother   . Arthritis Mother   . Diabetes Mother   . Diabetes Sister   . Diabetes Sister   . Hypertension Sister   . Arrhythmia Sister        has pacemaker    No Known Allergies  Current Outpatient Medications on File Prior to Visit  Medication Sig Dispense Refill  . Avanafil (STENDRA) 100 MG TABS Take 1 tablet daily as needed by mouth (For ED). NOT TO EXCEED 1 TAB/DAY 20 tablet 6  . ibuprofen (ADVIL,MOTRIN) 200 MG tablet Take 200 mg by mouth every  6 (six) hours as needed for mild pain.    [name] lisinopril-hydrochlorothiazide (PRINZIDE,ZESTORETIC) 20-25 MG tablet Take 1 tablet by mouth daily. 30 tablet 5  . meloxicam (MOBIC) 15 MG tablet TAKE 1 TABLET EVERY DAY 30 tablet 0  . HYDROcodone-acetaminophen (NORCO/VICODIN) 5-325 MG tablet Take 1 tablet by mouth every 6 (six) hours as needed for moderate pain. (Patient not taking: Reported on 07/30/2016) 30 tablet 0   No current facility-administered medications on file prior to visit.     BP (!) 160/90 (BP Location: Left Arm, Patient Position: Sitting)   Temp 99.1 F (37.3 C) (Oral)   Ht 6\' 1"  (1.854 m)   Wt (!) 368 lb 6.4 oz (167.1 kg)   BMI 48.60 kg/m       Objective:   Physical Exam    Constitutional: He is oriented to person, place, and time. He appears well-developed and well-nourished. No distress.  HENT:  Head: Normocephalic and atraumatic.  Right Ear: External ear normal.  Left Ear: External ear normal.  Nose: Nose normal.  Mouth/Throat: Oropharynx is clear and moist.  Eyes: Conjunctivae and EOM are normal. Pupils are equal, round, and reactive to light. Right eye exhibits no discharge. Left eye exhibits no discharge. No scleral icterus.  Neck: Normal range of motion. Neck supple. No thyromegaly present.  Cardiovascular: Normal rate, regular rhythm, normal heart sounds and intact distal pulses. Exam reveals no gallop and no friction rub.  No murmur heard. Pulmonary/Chest: Effort normal. No respiratory distress. He has wheezes. He has no rales. He exhibits no tenderness.  Lymphadenopathy:    He has no cervical adenopathy.  Neurological: He is alert and oriented to person, place, and time.  Skin: Skin is warm and dry. No rash noted. He is not diaphoretic. No erythema. No pallor.  Psychiatric: He has a normal mood and affect. His behavior is normal. Judgment and thought content normal.  Nursing note and vitals reviewed.     Assessment & Plan:  1. Upper respiratory tract infection, unspecified type - Encouraged to quit smoking  - doxycycline (VIBRAMYCIN) 100 MG capsule; Take 1 capsule (100 mg total) by mouth 2 (two) times daily.  Dispense: 20 capsule; Refill: 0 - predniSONE (DELTASONE) 10 MG tablet; 40 mg x 3 days, 20 mg x 3 days, 10 mg x 3 days  Dispense: 21 tablet; Refill: 0 - albuterol (PROVENTIL HFA;VENTOLIN HFA) 108 (90 Base) MCG/ACT inhaler; Inhale 2 puffs into the lungs every 6 (six) hours as needed for wheezing or shortness of breath.  Dispense: 1 Inhaler; Refill: 2   Dorothyann Peng, NP

## 2018-07-15 ENCOUNTER — Ambulatory Visit (INDEPENDENT_AMBULATORY_CARE_PROVIDER_SITE_OTHER): Payer: 59 | Admitting: Internal Medicine

## 2018-07-15 ENCOUNTER — Encounter: Payer: Self-pay | Admitting: Internal Medicine

## 2018-07-15 ENCOUNTER — Other Ambulatory Visit: Payer: Self-pay

## 2018-07-15 VITALS — BP 150/100 | HR 76 | Temp 98.4°F | Ht 73.0 in | Wt 370.2 lb

## 2018-07-15 DIAGNOSIS — G4733 Obstructive sleep apnea (adult) (pediatric): Secondary | ICD-10-CM

## 2018-07-15 DIAGNOSIS — Z0000 Encounter for general adult medical examination without abnormal findings: Secondary | ICD-10-CM | POA: Diagnosis not present

## 2018-07-15 DIAGNOSIS — M160 Bilateral primary osteoarthritis of hip: Secondary | ICD-10-CM

## 2018-07-15 DIAGNOSIS — R7302 Impaired glucose tolerance (oral): Secondary | ICD-10-CM

## 2018-07-15 DIAGNOSIS — I10 Essential (primary) hypertension: Secondary | ICD-10-CM

## 2018-07-15 DIAGNOSIS — F17200 Nicotine dependence, unspecified, uncomplicated: Secondary | ICD-10-CM

## 2018-07-15 LAB — COMPREHENSIVE METABOLIC PANEL
ALT: 32 U/L (ref 0–53)
AST: 19 U/L (ref 0–37)
Albumin: 4 g/dL (ref 3.5–5.2)
Alkaline Phosphatase: 40 U/L (ref 39–117)
BUN: 14 mg/dL (ref 6–23)
CO2: 30 mEq/L (ref 19–32)
Calcium: 9.1 mg/dL (ref 8.4–10.5)
Chloride: 102 mEq/L (ref 96–112)
Creatinine, Ser: 1.02 mg/dL (ref 0.40–1.50)
GFR: 94.04 mL/min (ref >60.00)
Glucose, Bld: 92 mg/dL (ref 70–99)
Potassium: 4.2 mEq/L (ref 3.5–5.1)
Sodium: 138 mEq/L (ref 135–145)
Total Bilirubin: 0.6 mg/dL (ref 0.2–1.2)
Total Protein: 7.2 g/dL (ref 6.0–8.3)

## 2018-07-15 LAB — LIPID PANEL
Cholesterol: 207 mg/dL — ABNORMAL HIGH (ref 0–200)
HDL: 70.8 mg/dL (ref >39.00)
LDL Cholesterol: 123 mg/dL — ABNORMAL HIGH (ref 0–99)
NonHDL: 135.85
Total CHOL/HDL Ratio: 3
Triglycerides: 64 mg/dL (ref 0.0–149.0)
VLDL: 12.8 mg/dL (ref 0.0–40.0)

## 2018-07-15 LAB — CBC WITH DIFFERENTIAL/PLATELET
Basophils Absolute: 0 10*3/uL (ref 0.0–0.1)
Basophils Relative: 0.7 % (ref 0.0–3.0)
Eosinophils Absolute: 0.2 10*3/uL (ref 0.0–0.7)
Eosinophils Relative: 4.2 % (ref 0.0–5.0)
HCT: 46.9 % (ref 39.0–52.0)
Hemoglobin: 16.1 g/dL (ref 13.0–17.0)
Lymphocytes Relative: 32.4 % (ref 12.0–46.0)
Lymphs Abs: 1.7 10*3/uL (ref 0.7–4.0)
MCHC: 34.3 g/dL (ref 30.0–36.0)
MCV: 91.9 fl (ref 78.0–100.0)
Monocytes Absolute: 0.5 10*3/uL (ref 0.1–1.0)
Monocytes Relative: 9 % (ref 3.0–12.0)
Neutro Abs: 2.8 10*3/uL (ref 1.4–7.7)
Neutrophils Relative %: 53.7 % (ref 43.0–77.0)
Platelets: 196 10*3/uL (ref 150.0–400.0)
RBC: 5.11 Mil/uL (ref 4.22–5.81)
RDW: 14.2 % (ref 11.5–15.5)
WBC: 5.3 10*3/uL (ref 4.0–10.5)

## 2018-07-15 LAB — VITAMIN D 25 HYDROXY (VIT D DEFICIENCY, FRACTURES): VITD: 13.15 ng/mL — ABNORMAL LOW (ref 30.00–100.00)

## 2018-07-15 LAB — HEMOGLOBIN A1C: Hgb A1c MFr Bld: 6 % (ref 4.6–6.5)

## 2018-07-15 LAB — TSH: TSH: 1.57 u[IU]/mL (ref 0.35–4.50)

## 2018-07-15 LAB — VITAMIN B12: Vitamin B-12: 293 pg/mL (ref 211–911)

## 2018-07-15 MED ORDER — LISINOPRIL-HYDROCHLOROTHIAZIDE 20-25 MG PO TABS: 1.0000 | ORAL_TABLET | Freq: Every day | ORAL | 1 refills | Status: DC

## 2018-07-15 NOTE — Addendum Note (Signed)
Addended by: Elmer Picker on: 07/15/2018 10:10 AM   Modules accepted: Orders

## 2018-07-15 NOTE — Patient Instructions (Addendum)
-Nice meeting you today!  -Lab work today; will notify you when results are available.  -Schedule follow up in 4-6 weeks. Bring blood pressure log into next visit.   Preventive Care 40-64 Years, Male Preventive care refers to lifestyle choices and visits with your health care provider that can promote health and wellness. What does preventive care include?   A yearly physical exam. This is also called an annual well check.  Dental exams once or twice a year.  Routine eye exams. Ask your health care provider how often you should have your eyes checked.  Personal lifestyle choices, including: ? Daily care of your teeth and gums. ? Regular physical activity. ? Eating a healthy diet. ? Avoiding tobacco and drug use. ? Limiting alcohol use. ? Practicing safe sex. ? Taking low-dose aspirin every day starting at age 2. What happens during an annual well check? The services and screenings done by your health care provider during your annual well check will depend on your age, overall health, lifestyle risk factors, and family history of disease. Counseling Your health care provider may ask you questions about your:  Alcohol use.  Tobacco use.  Drug use.  Emotional well-being.  Home and relationship well-being.  Sexual activity.  Eating habits.  Work and work Statistician. Screening You may have the following tests or measurements:  Height, weight, and BMI.  Blood pressure.  Lipid and cholesterol levels. These may be checked every 5 years, or more frequently if you are over 84 years old.  Skin check.  Lung cancer screening. You may have this screening every year starting at age 60 if you have a 30-pack-year history of smoking and currently smoke or have quit within the past 15 years.  Colorectal cancer screening. All adults should have this screening starting at age 65 and continuing until age 49. Your health care provider may recommend screening at age 76. You will  have tests every 1-10 years, depending on your results and the type of screening test. People at increased risk should start screening at an earlier age. Screening tests may include: ? Guaiac-based fecal occult blood testing. ? Fecal immunochemical test (FIT). ? Stool DNA test. ? Virtual colonoscopy. ? Sigmoidoscopy. During this test, a flexible tube with a tiny camera (sigmoidoscope) is used to examine your rectum and lower colon. The sigmoidoscope is inserted through your anus into your rectum and lower colon. ? Colonoscopy. During this test, a long, thin, flexible tube with a tiny camera (colonoscope) is used to examine your entire colon and rectum.  Prostate cancer screening. Recommendations will vary depending on your family history and other risks.  Hepatitis C blood test.  Hepatitis B blood test.  Sexually transmitted disease (STD) testing.  Diabetes screening. This is done by checking your blood sugar (glucose) after you have not eaten for a while (fasting). You may have this done every 1-3 years. Discuss your test results, treatment options, and if necessary, the need for more tests with your health care provider. Vaccines Your health care provider may recommend certain vaccines, such as:  Influenza vaccine. This is recommended every year.  Tetanus, diphtheria, and acellular pertussis (Tdap, Td) vaccine. You may need a Td booster every 10 years.  Varicella vaccine. You may need this if you have not been vaccinated.  Zoster vaccine. You may need this after age 65.  Measles, mumps, and rubella (MMR) vaccine. You may need at least one dose of MMR if you were born in 1957 or later. You  may also need a second dose.  Pneumococcal 13-valent conjugate (PCV13) vaccine. You may need this if you have certain conditions and have not been vaccinated.  Pneumococcal polysaccharide (PPSV23) vaccine. You may need one or two doses if you smoke cigarettes or if you have certain  conditions.  Meningococcal vaccine. You may need this if you have certain conditions.  Hepatitis A vaccine. You may need this if you have certain conditions or if you travel or work in places where you may be exposed to hepatitis A.  Hepatitis B vaccine. You may need this if you have certain conditions or if you travel or work in places where you may be exposed to hepatitis B.  Haemophilus influenzae type b (Hib) vaccine. You may need this if you have certain risk factors. Talk to your health care provider about which screenings and vaccines you need and how often you need them. This information is not intended to replace advice given to you by your health care provider. Make sure you discuss any questions you have with your health care provider. Document Released: 02/25/2015 Document Revised: 03/21/2017 Document Reviewed: 11/30/2014 Elsevier Interactive Patient Education  2019 [employer].

## 2018-07-15 NOTE — Progress Notes (Signed)
Established Patient Office Visit     CC/Reason for Visit: Annual preventive exam, follow-up chronic conditions, medication refills  HPI: Justin Norman is a 48 y.o. male who is coming in today for the above mentioned reasons. Past Medical History is significant for: Morbid obesity, states he has gained 15 pounds in the last 6 months, hypertension that has not been well controlled and in fact he has been out of his blood pressure medications for the past 2 months at least, he has a history of obstructive sleep apnea on nightly CPAP, states he gets about 7 hours of sleep each night, does not have any daytime somnolence or fatigue.  He also has ongoing tobacco abuse of about 1 pack/week, has been a smoker for over 20 years, he also is an everyday drinker of about 2-3 vodka shots.  His family history is significant for a mother with hypertension and dementia, sister with hypertension and another sister with diabetes.  He has routine dental care, last visit was postponed due to COVID-19, he does not have routine eye care.  He states that he believes that he passed a kidney stone on Sunday, he had some blood in his urine afterwards that has now resolved   Past Medical/Surgical History: Past Medical History:  Diagnosis Date   Anxiety    during a divorce   Dry skin    ERECTILE DYSFUNCTION 03/03/2007   GERD (gastroesophageal reflux disease)    not taking meds   Headache    headaches   HYPERTENSION 07/03/2007   HYPERTENSION NEC 03/03/2007   Kidney stone    Obesities, morbid ([place])    Sleep apnea 09/2012   does not use Cpap   Tear of medial meniscus of right knee 11/20/2013    Past Surgical History:  Procedure Laterality Date   KNEE ARTHROSCOPY Right 11/20/2013   Procedure: RIGHT KNEE ARTHROSCOPY WITH MEDIAL MENISCECTOMY ;  Surgeon: Johnny Bridge, MD;  Location: [facility];  Service: Orthopedics;  Laterality: Right;   vocal cord abscess surgery      Social History:  reports that he has been smoking cigarettes. He has a 7.50 pack-year smoking history. He has never used smokeless tobacco. He reports current alcohol use. He reports that he does not use drugs.  Allergies: No Known Allergies  Family History:  Family History  Problem Relation Age of Onset   Hypertension Mother    Arthritis Mother    Diabetes Mother    Diabetes Sister    Diabetes Sister    Hypertension Sister    Arrhythmia Sister        has pacemaker     Current Outpatient Medications:    lisinopril-hydrochlorothiazide (ZESTORETIC) 20-25 MG tablet, Take 1 tablet by mouth daily., Disp: 90 tablet, Rfl: 1   meloxicam (MOBIC) 15 MG tablet, TAKE 1 TABLET EVERY DAY, Disp: 30 tablet, Rfl: 0  Review of Systems:  Constitutional: Denies fever, chills, diaphoresis, appetite change and fatigue.  HEENT: Denies photophobia, eye pain, redness, hearing loss, ear pain, congestion, sore throat, rhinorrhea, sneezing, mouth sores, trouble swallowing, neck pain, neck stiffness and tinnitus.   Respiratory: Denies SOB, DOE, cough, chest tightness,  and wheezing.   Cardiovascular: Denies chest pain, palpitations and leg swelling.  Gastrointestinal: Denies nausea, vomiting, abdominal pain, diarrhea, constipation, blood in stool and abdominal distention.  Genitourinary: Denies dysuria, urgency, frequency, hematuria, flank pain and difficulty urinating.  Endocrine: Denies: hot or cold intolerance, sweats, changes in hair or nails, polyuria, polydipsia. Musculoskeletal:  Denies myalgias, back pain, joint swelling, arthralgias and gait problem.  Skin: Denies pallor, rash and wound.  Neurological: Denies dizziness, seizures, syncope, weakness, light-headedness, numbness and headaches.  Hematological: Denies adenopathy. Easy bruising, personal or family bleeding history  Psychiatric/Behavioral: Denies suicidal ideation, mood changes, confusion, nervousness, sleep disturbance and agitation    Physical  Exam: Vitals:   07/15/18 0929  BP: (!) 150/100  Pulse: 76  Temp: 98.4 F (36.9 C)  TempSrc: Oral  SpO2: 96%  Weight: (!) 370 lb 3.2 oz (167.9 kg)  Height: 6' 1" (1.854 m)    Body mass index is 48.84 kg/m.   Constitutional: NAD, calm, comfortable Eyes: PERRL, lids and conjunctivae normal ENMT: Mucous membranes are moist. Posterior pharynx clear of any exudate or lesions. Normal dentition. Tympanic membrane is pearly white, no erythema or bulging. Neck: normal, supple, no masses, no thyromegaly Respiratory: clear to auscultation bilaterally, no wheezing, no crackles. Normal respiratory effort. No accessory muscle use.  Cardiovascular: Regular rate and rhythm, no murmurs / rubs / gallops. No extremity edema. 2+ pedal pulses. No carotid bruits.  Abdomen: no tenderness, no masses palpated. No hepatosplenomegaly. Bowel sounds positive.  Musculoskeletal: no clubbing / cyanosis. No joint deformity upper and lower extremities. Good ROM, no contractures. Normal muscle tone.  Skin: no rashes, lesions, ulcers. No induration Neurologic: CN 2-12 grossly intact. Sensation intact, DTR normal. Strength 5/5 in all 4.  Psychiatric: Normal judgment and insight. Alert and oriented x 3. Normal mood.    Impression and Plan:  Encounter for preventive health examination  -He is up-to-date on immunizations, will need flu shot this year. -Not yet at age for cancer screening, no colon cancer in family members below age 62. -We have discussed importance of routine eye and dental care. -We have discussed alcohol and tobacco cessation. -We have discussed importance of a healthy lifestyle. -Screening labs today.  Essential hypertension -Not well controlled, refilled lisinopril/hydrochlorothiazide, advised to keep an ambulatory blood pressure log. -Will return for follow-up in 4 to 6 weeks.  Morbid obesity ([place]) -Discussed healthy lifestyle, including increased physical activity and better food choices  to promote weight loss. -Body mass index is 48.84 kg/m.  Impaired glucose tolerance  -Last A1c was 6.1 in June 2018, repeat A1c today.  Tobacco use disorder -I have discussed tobacco cessation with the patient.  I have counseled the patient regarding the negative impacts of continued tobacco use including but not limited to lung cancer, COPD, and cardiovascular disease.  I have discussed alternatives to tobacco and modalities that may help facilitate tobacco cessation including but not limited to biofeedback, hypnosis, and medications.  Total time spent with tobacco counseling was 4 minutes. -He is not interested in quitting at this time. -Will continue to address at subsequent visits.  OSA (obstructive sleep apnea) -Wears a CPAP nightly, gets about 7 hours of sleep, no daytime somnolence or fatigue.  Primary osteoarthritis of both hips and knees -On meloxicam, not requesting refills today.    Patient Instructions  -Nice meeting you today!  -Lab work today; will notify you when results are available.  -Schedule follow up in 4-6 weeks. Bring blood pressure log into next visit.   Preventive Care 40-64 Years, Male Preventive care refers to lifestyle choices and visits with your health care provider that can promote health and wellness. What does preventive care include?   A yearly physical exam. This is also called an annual well check.  Dental exams once or twice a year.  Routine eye exams.  Ask your health care provider how often you should have your eyes checked.  Personal lifestyle choices, including: ? Daily care of your teeth and gums. ? Regular physical activity. ? Eating a healthy diet. ? Avoiding tobacco and drug use. ? Limiting alcohol use. ? Practicing safe sex. ? Taking low-dose aspirin every day starting at age 72. What happens during an annual well check? The services and screenings done by your health care provider during your annual well check will depend  on your age, overall health, lifestyle risk factors, and family history of disease. Counseling Your health care provider may ask you questions about your:  Alcohol use.  Tobacco use.  Drug use.  Emotional well-being.  Home and relationship well-being.  Sexual activity.  Eating habits.  Work and work Statistician. Screening You may have the following tests or measurements:  Height, weight, and BMI.  Blood pressure.  Lipid and cholesterol levels. These may be checked every 5 years, or more frequently if you are over 51 years old.  Skin check.  Lung cancer screening. You may have this screening every year starting at age 54 if you have a 30-pack-year history of smoking and currently smoke or have quit within the past 15 years.  Colorectal cancer screening. All adults should have this screening starting at age 3 and continuing until age 45. Your health care provider may recommend screening at age 36. You will have tests every 1-10 years, depending on your results and the type of screening test. People at increased risk should start screening at an earlier age. Screening tests may include: ? Guaiac-based fecal occult blood testing. ? Fecal immunochemical test (FIT). ? Stool DNA test. ? Virtual colonoscopy. ? Sigmoidoscopy. During this test, a flexible tube with a tiny camera (sigmoidoscope) is used to examine your rectum and lower colon. The sigmoidoscope is inserted through your anus into your rectum and lower colon. ? Colonoscopy. During this test, a long, thin, flexible tube with a tiny camera (colonoscope) is used to examine your entire colon and rectum.  Prostate cancer screening. Recommendations will vary depending on your family history and other risks.  Hepatitis C blood test.  Hepatitis B blood test.  Sexually transmitted disease (STD) testing.  Diabetes screening. This is done by checking your blood sugar (glucose) after you have not eaten for a while (fasting).  You may have this done every 1-3 years. Discuss your test results, treatment options, and if necessary, the need for more tests with your health care provider. Vaccines Your health care provider may recommend certain vaccines, such as:  Influenza vaccine. This is recommended every year.  Tetanus, diphtheria, and acellular pertussis (Tdap, Td) vaccine. You may need a Td booster every 10 years.  Varicella vaccine. You may need this if you have not been vaccinated.  Zoster vaccine. You may need this after age 76.  Measles, mumps, and rubella (MMR) vaccine. You may need at least one dose of MMR if you were born in 1957 or later. You may also need a second dose.  Pneumococcal 13-valent conjugate (PCV13) vaccine. You may need this if you have certain conditions and have not been vaccinated.  Pneumococcal polysaccharide (PPSV23) vaccine. You may need one or two doses if you smoke cigarettes or if you have certain conditions.  Meningococcal vaccine. You may need this if you have certain conditions.  Hepatitis A vaccine. You may need this if you have certain conditions or if you travel or work in places where you may be  exposed to hepatitis A.  Hepatitis B vaccine. You may need this if you have certain conditions or if you travel or work in places where you may be exposed to hepatitis B.  Haemophilus influenzae type b (Hib) vaccine. You may need this if you have certain risk factors. Talk to your health care provider about which screenings and vaccines you need and how often you need them. This information is not intended to replace advice given to you by your health care provider. Make sure you discuss any questions you have with your health care provider. Document Released: 02/25/2015 Document Revised: 03/21/2017 Document Reviewed: 11/30/2014 Elsevier Interactive Patient Education  2019 [place], MD [facility]

## 2018-07-16 ENCOUNTER — Encounter: Payer: Self-pay | Admitting: Internal Medicine

## 2018-07-16 ENCOUNTER — Other Ambulatory Visit: Payer: Self-pay | Admitting: Internal Medicine

## 2018-07-16 DIAGNOSIS — E559 Vitamin D deficiency, unspecified: Secondary | ICD-10-CM

## 2018-07-16 DIAGNOSIS — E785 Hyperlipidemia, unspecified: Secondary | ICD-10-CM | POA: Insufficient documentation

## 2018-07-16 MED ORDER — VITAMIN D (ERGOCALCIFEROL) 1.25 MG (50000 UNIT) PO CAPS: 50000.0000 [IU] | ORAL_CAPSULE | ORAL | 0 refills | Status: DC

## 2018-07-17 ENCOUNTER — Other Ambulatory Visit: Payer: Self-pay | Admitting: Internal Medicine

## 2018-07-17 DIAGNOSIS — E559 Vitamin D deficiency, unspecified: Secondary | ICD-10-CM

## 2018-07-21 ENCOUNTER — Ambulatory Visit: Payer: Self-pay | Admitting: *Deleted

## 2018-07-21 NOTE — Telephone Encounter (Signed)
Message from Esaw Dace sent at 07/21/2018 8:39 AM EDT   Summary: Dizzyness/Off Balance   Pt stated he woke up dizzy and off balance. He stated he started taking lisinopril HCTZ recently and he was working in the yard in the sun this weekend and wonders if either of those may have contributed to his symptoms. Would like to speak with a nurse. Please advise.         Pt stated he woke up dizzy and off balance this morning. He started back taking the lisinopril  HCTZ , last Wednesday.  He felt some dizziness and off balance this morning so he checked his b/p this morning and it was 154/96. He stated that he feels fine now, not chest pain, headache, blurred vision, dizziness or weakness. and he will call 911 if he experiences any of these.  He has attempted to recheck his b/p but the machine is not working now. Advised him to go have his b/p checked and to get his monitor looked at. Also he stated that when he started back on the lisinopril, he started with a dry cough and has increasingly  become worst. He is going to call back with his b/p reading but would also like to speak with his provider regarding the side effect of the lisinopril. Routing to [facility] at [facility] for review and recommendation.  Reason for Disposition . [6] Systolic BP  >= 283 OR Diastolic >= 80 AND [1] taking BP medications  Answer Assessment - Initial Assessment Questions 1. BLOOD PRESSURE: "What is the blood pressure?" "Did you take at least two measurements 5 minutes apart?"    154/96 this morning but not able to recheck because of monitor problems 2. ONSET: "When did you take your blood pressure?"     When getting out of bed this morning 3. HOW: "How did you obtain the blood pressure?" (e.g., visiting nurse, automatic home BP monitor)     Automatic home BP monitor 4. HISTORY: "Do you have a history of high blood pressure?"     yes 5. MEDICATIONS: "Are you taking any medications for blood pressure?" "Have you missed  any doses recently?"    Yes started back taking medication last Wednesday 6. OTHER SYMPTOMS: "Do you have any symptoms?" (e.g., headache, chest pain, blurred vision, difficulty breathing, weakness)     no 7. PREGNANCY: "Is there any chance you are pregnant?" "When was your last menstrual period?"     no  Protocols used: HIGH BLOOD PRESSURE-A-AH

## 2018-07-22 NOTE — Telephone Encounter (Signed)
Office visit scheduled.

## 2018-07-23 ENCOUNTER — Encounter: Payer: Self-pay | Admitting: Internal Medicine

## 2018-07-23 ENCOUNTER — Other Ambulatory Visit: Payer: Self-pay

## 2018-07-23 ENCOUNTER — Ambulatory Visit (INDEPENDENT_AMBULATORY_CARE_PROVIDER_SITE_OTHER): Payer: 59 | Admitting: Internal Medicine

## 2018-07-23 VITALS — BP 160/100 | HR 94 | Temp 98.3°F | Wt 363.0 lb

## 2018-07-23 DIAGNOSIS — I10 Essential (primary) hypertension: Secondary | ICD-10-CM | POA: Diagnosis not present

## 2018-07-23 DIAGNOSIS — M160 Bilateral primary osteoarthritis of hip: Secondary | ICD-10-CM | POA: Diagnosis not present

## 2018-07-23 MED ORDER — MELOXICAM 15 MG PO TABS: 15.0000 mg | ORAL_TABLET | Freq: Every day | ORAL | 1 refills | Status: DC

## 2018-07-23 NOTE — Progress Notes (Signed)
Established Patient Office Visit     CC/Reason for Visit: BP follow up  HPI: Justin Norman is a 48 y.o. male who is coming in today for the above mentioned reasons. He was seen last week for his CPE and was found to be markedly hypertensive and started on lisinopril/HCTZ. He was working in the yard this weekend and developed some dizziness, thought it might be due to the medication and scheduled this visit to follow up on. No new complaints.   Past Medical/Surgical History: Past Medical History:  Diagnosis Date  . Anxiety    during a divorce  . Dry skin   . ERECTILE DYSFUNCTION 03/03/2007  . GERD (gastroesophageal reflux disease)    not taking meds  . Headache    headaches  . HYPERTENSION 07/03/2007  . HYPERTENSION NEC 03/03/2007  . Kidney stone   . Obesities, morbid ([place])   . Sleep apnea 09/2012   does not use Cpap  . Tear of medial meniscus of right knee 11/20/2013    Past Surgical History:  Procedure Laterality Date  . KNEE ARTHROSCOPY Right 11/20/2013   Procedure: RIGHT KNEE ARTHROSCOPY WITH MEDIAL MENISCECTOMY ;  Surgeon: Johnny Bridge, MD;  Location: [facility];  Service: Orthopedics;  Laterality: Right;  . vocal cord abscess surgery      Social History:  reports that he has been smoking cigarettes. He has a 7.50 pack-year smoking history. He has never used smokeless tobacco. He reports current alcohol use. He reports that he does not use drugs.  Allergies: No Known Allergies  Family History:  Family History  Problem Relation Age of Onset  . Hypertension Mother   . Arthritis Mother   . Diabetes Mother   . Diabetes Sister   . Diabetes Sister   . Hypertension Sister   . Arrhythmia Sister        has pacemaker     Current Outpatient Medications:  .  lisinopril-hydrochlorothiazide (ZESTORETIC) 20-25 MG tablet, Take 1 tablet by mouth daily., Disp: 90 tablet, Rfl: 1 .  meloxicam (MOBIC) 15 MG tablet, Take 1 tablet (15 mg total) by mouth daily., Disp: 90  tablet, Rfl: 1 .  Vitamin D, Ergocalciferol, (DRISDOL) 1.25 MG (50000 UT) CAPS capsule, Take 1 capsule (50,000 Units total) by mouth every 7 (seven) days for 12 doses., Disp: 12 capsule, Rfl: 0  Review of Systems:  Constitutional: Denies fever, chills, diaphoresis, appetite change and fatigue.  HEENT: Denies photophobia, eye pain, redness, hearing loss, ear pain, congestion, sore throat, rhinorrhea, sneezing, mouth sores, trouble swallowing, neck pain, neck stiffness and tinnitus.   Respiratory: Denies SOB, DOE, cough, chest tightness,  and wheezing.   Cardiovascular: Denies chest pain, palpitations and leg swelling.  Gastrointestinal: Denies nausea, vomiting, abdominal pain, diarrhea, constipation, blood in stool and abdominal distention.  Genitourinary: Denies dysuria, urgency, frequency, hematuria, flank pain and difficulty urinating.  Endocrine: Denies: hot or cold intolerance, sweats, changes in hair or nails, polyuria, polydipsia. Musculoskeletal: Denies myalgias, back pain, joint swelling, arthralgias and gait problem.  Skin: Denies pallor, rash and wound.  Neurological: Denies dizziness, seizures, syncope, weakness, light-headedness, numbness and headaches.  Hematological: Denies adenopathy. Easy bruising, personal or family bleeding history  Psychiatric/Behavioral: Denies suicidal ideation, mood changes, confusion, nervousness, sleep disturbance and agitation    Physical Exam: Vitals:   07/23/18 1552  BP: (!) 160/100  Pulse: 94  Temp: 98.3 F (36.8 C)  TempSrc: Oral  SpO2: 95%  Weight: (!) 363 lb (164.7 kg)  Body mass index is 47.89 kg/m.   Constitutional: NAD, calm, comfortable Eyes: PERRL, lids and conjunctivae normal ENMT: Mucous membranes are moist.  Respiratory: clear to auscultation bilaterally, no wheezing, no crackles. Normal respiratory effort. No accessory muscle use.  Cardiovascular: Regular rate and rhythm, no murmurs / rubs / gallops. No extremity edema.  2+ pedal pulses. No carotid bruits.  Psychiatric: Normal judgment and insight. Alert and oriented x 3. Normal mood.    Impression and Plan:  Essential hypertension -As expected, BP remains elevated as meds were only started 1 week ago. -Suspect transient dizziness due to dehydration and mild orthostasis as he was working outside in the heat. -Continue BP meds and follow up in 1 month.  Primary osteoarthritis of both hips and knees -Will refill meloxicam.      Lelon Frohlich, MD [facility] at [facility]

## 2018-10-11 ENCOUNTER — Other Ambulatory Visit: Payer: Self-pay | Admitting: Internal Medicine

## 2018-10-11 DIAGNOSIS — E559 Vitamin D deficiency, unspecified: Secondary | ICD-10-CM

## 2018-10-21 ENCOUNTER — Other Ambulatory Visit: Payer: Self-pay

## 2019-01-12 ENCOUNTER — Encounter: Payer: Self-pay | Admitting: Family Medicine

## 2019-01-12 ENCOUNTER — Ambulatory Visit (INDEPENDENT_AMBULATORY_CARE_PROVIDER_SITE_OTHER): Payer: 59 | Admitting: Family Medicine

## 2019-01-12 ENCOUNTER — Other Ambulatory Visit: Payer: Self-pay

## 2019-01-12 VITALS — BP 170/100 | HR 81 | Temp 98.0°F | Ht 73.0 in | Wt 375.0 lb

## 2019-01-12 DIAGNOSIS — M26621 Arthralgia of right temporomandibular joint: Secondary | ICD-10-CM

## 2019-01-12 MED ORDER — METHYLPREDNISOLONE 4 MG PO TBPK: ORAL_TABLET | ORAL | 0 refills | Status: DC

## 2019-01-12 NOTE — Progress Notes (Signed)
   Subjective:    Patient ID: Justin Norman, male    DOB: December 26, 1970, 48 y.o.   MRN: [id number]  HPI Here for 2 days of pain in the right ear. No headache or sinus congestion. No PND or ST. No cough or SOB. No fever. He has tried OTC ear wax removal drops with no relief. Ibuprofen does help the pain for a few hours but it comes back. He notes that chewing food is painful.  Review of Systems  Constitutional: Negative.   HENT: Positive for ear pain. Negative for congestion, ear discharge, facial swelling, hearing loss, postnasal drip, sinus pressure, sinus pain and sore throat.   Eyes: Negative.   Respiratory: Negative.        Objective:   Physical Exam Constitutional:      Appearance: Normal appearance. He is not ill-appearing.  HENT:     Head: Normocephalic and atraumatic.     Right Ear: Tympanic membrane, ear canal and external ear normal.     Left Ear: Tympanic membrane, ear canal and external ear normal.     Nose: Nose normal.     Mouth/Throat:     Pharynx: Oropharynx is clear.     Comments: The right TMJ is quite tender, full ROM, no crepitus  Eyes:     Conjunctiva/sclera: Conjunctivae normal.  Pulmonary:     Effort: Pulmonary effort is normal.     Breath sounds: Normal breath sounds.  Lymphadenopathy:     Cervical: No cervical adenopathy.  Neurological:     Mental Status: He is alert.           Assessment & Plan:  TMJ pain, given a Medrol dose pack. Avoid opening his mouth wide or chewing as much as possible for a week or two.  Alysia Penna, MD

## 2019-02-04 ENCOUNTER — Encounter: Payer: Self-pay | Admitting: Family Medicine

## 2019-02-04 ENCOUNTER — Other Ambulatory Visit: Payer: Self-pay

## 2019-02-04 ENCOUNTER — Ambulatory Visit: Payer: Self-pay | Admitting: *Deleted

## 2019-02-04 ENCOUNTER — Ambulatory Visit (INDEPENDENT_AMBULATORY_CARE_PROVIDER_SITE_OTHER): Payer: 59 | Admitting: Family Medicine

## 2019-02-04 VITALS — BP 172/100 | HR 100 | Temp 98.0°F | Ht 73.0 in | Wt 372.2 lb

## 2019-02-04 DIAGNOSIS — M67431 Ganglion, right wrist: Secondary | ICD-10-CM | POA: Diagnosis not present

## 2019-02-04 DIAGNOSIS — I10 Essential (primary) hypertension: Secondary | ICD-10-CM

## 2019-02-04 DIAGNOSIS — R300 Dysuria: Secondary | ICD-10-CM | POA: Diagnosis not present

## 2019-02-04 DIAGNOSIS — Z113 Encounter for screening for infections with a predominantly sexual mode of transmission: Secondary | ICD-10-CM

## 2019-02-04 LAB — POCT URINALYSIS DIPSTICK
Bilirubin, UA: NEGATIVE
Blood, UA: POSITIVE
Glucose, UA: NEGATIVE
Ketones, UA: NEGATIVE
Nitrite, UA: NEGATIVE
Protein, UA: POSITIVE — AB
Spec Grav, UA: 1.02 (ref 1.010–1.025)
Urobilinogen, UA: 0.2 E.U./dL
pH, UA: 6 (ref 5.0–8.0)

## 2019-02-04 MED ORDER — CEPHALEXIN 500 MG PO CAPS: 500.0000 mg | ORAL_CAPSULE | Freq: Three times a day (TID) | ORAL | 0 refills | Status: DC

## 2019-02-04 NOTE — Progress Notes (Signed)
Subjective:     Patient ID: Justin Norman, male   DOB: 08/17/1970, 48 y.o.   MRN: [id number]  HPI Justin Norman is seen for the following issues  Tuesday he noticed some mild "stinging "with urination.  He had one episode of blood with urination on Tuesday but none since then.  This was accompanied by some urgency.  He thinks he may have had a stone previously but not sure.  He states the symptoms were somewhat similar.  Denies any recent flank pain or abdominal pain.  No nausea or vomiting.  No fevers or chills.  No penile discharge.  He is requesting STD testing.  No recent rashes.  Monogamous.  Denies past history of STD.  He has had good urine stream.  No obstructive symptoms.  He has hypertension treated with lisinopril HCTZ.  Blood pressure up today but he did not take his medication last night.  He has cystic lesion right wrist minimally painful which came up recently.  Denies any wrist injury.  Past Medical History:  Diagnosis Date  . Anxiety    during a divorce  . Dry skin   . ERECTILE DYSFUNCTION 03/03/2007  . GERD (gastroesophageal reflux disease)    not taking meds  . Headache    headaches  . HYPERTENSION 07/03/2007  . HYPERTENSION NEC 03/03/2007  . Kidney stone   . Obesities, morbid ([place])   . Sleep apnea 09/2012   does not use Cpap  . Tear of medial meniscus of right knee 11/20/2013   Past Surgical History:  Procedure Laterality Date  . KNEE ARTHROSCOPY Right 11/20/2013   Procedure: RIGHT KNEE ARTHROSCOPY WITH MEDIAL MENISCECTOMY ;  Surgeon: Johnny Bridge, MD;  Location: [place];  Service: Orthopedics;  Laterality: Right;  . vocal cord abscess surgery      reports that he has been smoking cigarettes. He has a 7.50 pack-year smoking history. He has never used smokeless tobacco. He reports current alcohol use. He reports that he does not use drugs. family history includes Arrhythmia in his sister; Arthritis in his mother; Diabetes in his mother, sister, and sister;  Hypertension in his mother and sister. No Known Allergies   Review of Systems  Constitutional: Negative for appetite change, chills, fever and unexpected weight change.  Respiratory: Negative for cough and shortness of breath.   Cardiovascular: Negative for chest pain.  Gastrointestinal: Negative for abdominal pain, nausea and vomiting.  Genitourinary: Positive for dysuria. Negative for difficulty urinating and flank pain.  Skin: Negative for rash.  Psychiatric/Behavioral: Negative for confusion.       Objective:   Physical Exam Vitals reviewed.  Constitutional:      Appearance: Normal appearance.  Cardiovascular:     Rate and Rhythm: Normal rate and regular rhythm.  Pulmonary:     Effort: Pulmonary effort is normal.     Breath sounds: Normal breath sounds.  Musculoskeletal:     Comments: Mobile, rounded,  non-tender cystic lesion right wrist near the base of the thumb.  Neurological:     Mental Status: He is alert.        Assessment:     #1 dysuria.  Patient does have hematuria on dipstick and small leukocytes.  Etiology not clear.  Differential could include infection, kidney stone, malignancy  #2 hypertension poorly controlled.  Patient did not take medication yesterday which may be contributing  #3 benign appearing ganglion cyst right wrist  #4 request for STD screening.  Low risk,  Asymptomatic with exception of  urine symptoms above.    Plan:     -Reassurance regarding ganglion cyst -Patient is encouraged to get back on lisinopril HCTZ regularly and schedule follow-up with Dr. Jerilee Hoh soon -Urine culture and cytology sent -Start Keflex 500 g 3 times daily pending culture results -STD check with urine cytology for GC, chlamydia, trichomonas, and HIV and RPR -- if urine cx negative and > 3 RBCs per HPF will need urologic workup for hematuria.  Eulas Post MD [facility] at [facility]

## 2019-02-04 NOTE — Telephone Encounter (Signed)
Patient is calling to report blood in urine- started yesterday.  Reason for Disposition . Pain or burning with passing urine  Answer Assessment - Initial Assessment Questions 1. COLOR of URINE: "Describe the color of the urine."  (e.g., tea-colored, pink, red, blood clots, bloody)     Normal/pink- possible stone in toilet 2. ONSET: "When did the bleeding start?"      Yesterday 3. EPISODES: "How many times has there been blood in the urine?" or "How many times today?"     Yesterday- started- light this morning 4. PAIN with URINATION: "Is there any pain with passing your urine?" If so, ask: "How bad is the pain?"  (Scale 1-10; or mild, moderate, severe)    - MILD - complains slightly about urination hurting    - MODERATE - interferes with normal activities      - SEVERE - excruciating, unwilling or unable to urinate because of the pain      Stinging sensation 5. FEVER: "Do you have a fever?" If so, ask: "What is your temperature, how was it measured, and when did it start?"     no 6. ASSOCIATED SYMPTOMS: "Are you passing urine more frequently than usual?"     No- it has calmed down 7. OTHER SYMPTOMS: "Do you have any other symptoms?" (e.g., back/flank pain, abdominal pain, vomiting)     Pain in thighs 8. PREGNANCY: "Is there any chance you are pregnant?" "When was your last menstrual period?"     n/a  Protocols used: URINE - BLOOD IN-A-AH

## 2019-02-04 NOTE — Patient Instructions (Signed)
Ganglion Cyst  A ganglion cyst is a non-cancerous, fluid-filled lump that occurs near a joint or tendon. The cyst grows out of a joint or the lining of a tendon. Ganglion cysts most often develop in the hand or wrist, but they can also develop in the shoulder, elbow, hip, knee, ankle, or foot. Ganglion cysts are ball-shaped or egg-shaped. Their size can range from the size of a pea to larger than a grape. Increased activity may cause the cyst to get bigger because more fluid starts to build up. What are the causes? The exact cause of this condition is not known, but it may be related to:  Inflammation or irritation around the joint.  An injury.  Repetitive movements or overuse.  Arthritis. What increases the risk? You are more likely to develop this condition if:  You are a woman.  You are 40-59 years old. What are the signs or symptoms? The main symptom of this condition is a lump. It most often appears on the hand or wrist. In many cases, there are no other symptoms, but a cyst can sometimes cause:  Tingling.  Pain.  Numbness.  Muscle weakness.  Weak grip.  Less range of motion in a joint. How is this diagnosed? Ganglion cysts are usually diagnosed based on a physical exam. Your health care provider will feel the lump and may shine a light next to it. If it is a ganglion cyst, the light will likely shine through it. Your health care provider may order an X-ray, ultrasound, or MRI to rule out other conditions. How is this treated? Ganglion cysts often go away on their own without treatment. If you have pain or other symptoms, treatment may be needed. Treatment is also needed if the ganglion cyst limits your movement or if it gets infected. Treatment may include:  Wearing a brace or splint on your wrist or finger.  Taking anti-inflammatory medicine.  Having fluid drained from the lump with a needle (aspiration).  Getting a steroid injected into the joint.  Having  surgery to remove the ganglion cyst.  Placing a pad on your shoe or wearing shoes that will not rub against the cyst if it is on your foot. Follow these instructions at home:  Do not press on the ganglion cyst, poke it with a needle, or hit it.  Take over-the-counter and prescription medicines only as told by your health care provider.  If you have a brace or splint: ? Wear it as told by your health care provider. ? Remove it as told by your health care provider. Ask if you need to remove it when you take a shower or a bath.  Watch your ganglion cyst for any changes.  Keep all follow-up visits as told by your health care provider. This is important. Contact a health care provider if:  Your ganglion cyst becomes larger or more painful.  You have pus coming from the lump.  You have weakness or numbness in the affected area.  You have a fever or chills. Get help right away if:  You have a fever and have any of these in the cyst area: ? Increased redness. ? Red streaks. ? Swelling. Summary  A ganglion cyst is a non-cancerous, fluid-filled lump that occurs near a joint or tendon.  Ganglion cysts most often develop in the hand or wrist, but they can also develop in the shoulder, elbow, hip, knee, ankle, or foot.  Ganglion cysts often go away on their own without treatment.  This information is not intended to replace advice given to you by your health care provider. Make sure you discuss any questions you have with your health care provider. Document Released: 01/27/2000 Document Revised: 01/11/2017 Document Reviewed: 09/28/2016 Elsevier Patient Education  [place] three times daily  We will call you with the culture results.

## 2019-02-05 LAB — URINALYSIS, MICROSCOPIC ONLY

## 2019-02-05 LAB — RPR: RPR Ser Ql: NONREACTIVE

## 2019-02-05 LAB — HIV ANTIBODY (ROUTINE TESTING W REFLEX): HIV 1&2 Ab, 4th Generation: NONREACTIVE

## 2019-02-06 LAB — URINE CULTURE
MICRO NUMBER:: 1226859
SPECIMEN QUALITY:: ADEQUATE

## 2019-02-09 ENCOUNTER — Telehealth: Payer: Self-pay | Admitting: Internal Medicine

## 2019-02-09 NOTE — Telephone Encounter (Signed)
Patient spoke with Mechele Claude F and verbalized understanding. Nothing further needed

## 2019-02-09 NOTE — Telephone Encounter (Signed)
Pt requesting Dr Lemmie Evens or nurse to give FU call asap. Pt looked at test results over the week end and is very confused and wants someone to FU with him at [phone] with the results so he can understand and maybe take medication, Pls FU

## 2019-03-04 ENCOUNTER — Ambulatory Visit: Payer: 59 | Admitting: Internal Medicine

## 2019-03-04 ENCOUNTER — Ambulatory Visit (INDEPENDENT_AMBULATORY_CARE_PROVIDER_SITE_OTHER): Payer: 59 | Admitting: Internal Medicine

## 2019-03-04 ENCOUNTER — Other Ambulatory Visit: Payer: Self-pay

## 2019-03-04 ENCOUNTER — Encounter: Payer: Self-pay | Admitting: Internal Medicine

## 2019-03-04 VITALS — BP 140/80 | HR 90 | Temp 98.0°F | Wt 381.2 lb

## 2019-03-04 DIAGNOSIS — T7840XA Allergy, unspecified, initial encounter: Secondary | ICD-10-CM | POA: Diagnosis not present

## 2019-03-04 MED ORDER — PREDNISONE 10 MG (21) PO TBPK: ORAL_TABLET | ORAL | 0 refills | Status: DC

## 2019-03-04 NOTE — Patient Instructions (Signed)
-  Nice seeing you today!!  -Take claritin or zyrtec daily for 2 weeks.  -Prednisone taper for 6 days. Take as directed on package.  -Keflex allergy noted on your chart.

## 2019-03-04 NOTE — Progress Notes (Signed)
Established Patient Office Visit     This visit occurred during the SARS-CoV-2 public health emergency.  Safety protocols were in place, including screening questions prior to the visit, additional usage of staff PPE, and extensive cleaning of exam room while observing appropriate contact time as indicated for disinfecting solutions.    CC/Reason for Visit: Probable allergic reaction  HPI: Justin Norman is a 49 y.o. male who is coming in today for the above mentioned reasons.  In December 2013 he was seen in the clinic for urinary frequency and hematuria.  He thinks he passed a small stone.  UTI was diagnosed after culture came back positive for E. coli and he was prescribed Keflex.  He took 5 days of this without incident.  This past Saturday he thought he had developed a tooth infection.  He had one extra Keflex pill left over which he took with an ibuprofen.  4 hours later he found himself diaphoretic, with hives on arms and trunk, he then developed lower lip swelling.  He started taking an allergy med (unknown name) with some improvement.  He has also been using calamine lotion.  2 days ago his upper lip started swelling.  He felt like "he had a frog in his throat".  He never sought medical attention.   Past Medical/Surgical History: Past Medical History:  Diagnosis Date  . Anxiety    during a divorce  . Dry skin   . ERECTILE DYSFUNCTION 03/03/2007  . GERD (gastroesophageal reflux disease)    not taking meds  . Headache    headaches  . HYPERTENSION 07/03/2007  . HYPERTENSION NEC 03/03/2007  . Kidney stone   . Obesities, morbid ([place])   . Sleep apnea 09/2012   does not use Cpap  . Tear of medial meniscus of right knee 11/20/2013    Past Surgical History:  Procedure Laterality Date  . KNEE ARTHROSCOPY Right 11/20/2013   Procedure: RIGHT KNEE ARTHROSCOPY WITH MEDIAL MENISCECTOMY ;  Surgeon: Johnny Bridge, MD;  Location: [facility];  Service: Orthopedics;  Laterality: Right;  .  vocal cord abscess surgery      Social History:  reports that he has been smoking cigarettes. He has a 7.50 pack-year smoking history. He has never used smokeless tobacco. He reports current alcohol use. He reports that he does not use drugs.  Allergies: Allergies  Allergen Reactions  . Keflex [Cephalexin]     Family History:  Family History  Problem Relation Age of Onset  . Hypertension Mother   . Arthritis Mother   . Diabetes Mother   . Diabetes Sister   . Diabetes Sister   . Hypertension Sister   . Arrhythmia Sister        has pacemaker     Current Outpatient Medications:  .  lisinopril-hydrochlorothiazide (ZESTORETIC) 20-25 MG tablet, Take 1 tablet by mouth daily., Disp: 90 tablet, Rfl: 1 .  meloxicam (MOBIC) 15 MG tablet, Take 1 tablet (15 mg total) by mouth daily., Disp: 90 tablet, Rfl: 1 .  predniSONE (STERAPRED UNI-PAK 21 TAB) 10 MG (21) TBPK tablet, Take as directed, Disp: 21 tablet, Rfl: 0  Review of Systems:  Constitutional: Denies fever, chills, diaphoresis, appetite change and fatigue.  HEENT: Denies photophobia, eye pain, redness, hearing loss, ear pain, congestion, sore throat, rhinorrhea, sneezing, mouth sores, trouble swallowing, neck pain, neck stiffness and tinnitus.   Respiratory: Denies SOB, DOE, cough, chest tightness,  and wheezing.   Cardiovascular: Denies chest pain, palpitations and leg  swelling.  Gastrointestinal: Denies nausea, vomiting, abdominal pain, diarrhea, constipation, blood in stool and abdominal distention.  Genitourinary: Denies dysuria, urgency, frequency, hematuria, flank pain and difficulty urinating.  Endocrine: Denies: hot or cold intolerance, sweats, changes in hair or nails, polyuria, polydipsia. Musculoskeletal: Denies myalgias, back pain, joint swelling, arthralgias and gait problem.  Skin: Denies pallor, rash and wound.  Neurological: Denies dizziness, seizures, syncope, weakness, light-headedness, numbness and headaches.    Hematological: Denies adenopathy. Easy bruising, personal or family bleeding history  Psychiatric/Behavioral: Denies suicidal ideation, mood changes, confusion, nervousness, sleep disturbance and agitation    Physical Exam: Vitals:   03/04/19 0849  BP: 140/80  Pulse: 90  Temp: 98 F (36.7 C)  TempSrc: Temporal  SpO2: 97%  Weight: (!) 381 lb 3.2 oz (172.9 kg)    Body mass index is 50.29 kg/m.   Constitutional: NAD, calm, comfortable Eyes: PERRL, lids and conjunctivae normal ENMT: Mucous membranes are moist.  Respiratory: clear to auscultation bilaterally, no wheezing, no crackles. Normal respiratory effort. No accessory muscle use.  Cardiovascular: Regular rate and rhythm, no murmurs / rubs / gallops. No extremity edema.  Psychiatric: Normal judgment and insight. Alert and oriented x 3. Normal mood.    Impression and Plan:  Allergic reaction, initial encounter  -Possible Keflex allergy. -Has been noted in chart. -Due to his severe response we will give a 6-day prednisone taper, advised to take daily antihistamine for about 2 weeks. -He currently does not have shortness of breath or trouble breathing, he has been advised in the future to seek medical attention if he has this severe of a reaction immediately.    Patient Instructions  -Nice seeing you today!!  -Take claritin or zyrtec daily for 2 weeks.  -Prednisone taper for 6 days. Take as directed on package.  -Keflex allergy noted on your chart.     Lelon Frohlich, MD [facility]

## 2019-04-17 ENCOUNTER — Other Ambulatory Visit: Payer: Self-pay

## 2019-04-20 ENCOUNTER — Other Ambulatory Visit: Payer: Self-pay

## 2019-04-20 ENCOUNTER — Telehealth (INDEPENDENT_AMBULATORY_CARE_PROVIDER_SITE_OTHER): Payer: 59 | Admitting: Family Medicine

## 2019-04-20 DIAGNOSIS — J189 Pneumonia, unspecified organism: Secondary | ICD-10-CM

## 2019-04-20 DIAGNOSIS — M67431 Ganglion, right wrist: Secondary | ICD-10-CM | POA: Diagnosis not present

## 2019-04-20 NOTE — Progress Notes (Signed)
Virtual Visit via Telephone Note  I connected with the patient on 04/20/19 at 10:30 AM EST by telephone and verified that I am speaking with the correct person using two identifiers.   I discussed the limitations, risks, security and privacy concerns of performing an evaluation and management service by telephone and the availability of in person appointments. I also discussed with the patient that there may be a patient responsible charge related to this service. The patient expressed understanding and agreed to proceed.  Location patient: home Location provider: work or home office Participants present for the call: patient, provider Patient did not have a visit in the prior 7 days to address this/these issue(s).   History of Present Illness: Here to discuss a painful knot in the right wrist that appeared about 6 months ago. It has lsowly grown larger and more painful over time. No hx of trauma. He had Dr. Elease Hashimoto look at this in December, and he frlt it was a ganglion cyst. Also Jamaal wanted [place] to know that last week on 04-15-19 he developed chills, dizziness, a dry cough, and some SOB. No fever or chest pain or body aches or NVD. On 04-16-19 he was seen at urgent care, and by exam he was felt to have a pneumonia. No CXR was taken. He was started on Amoxicillin and a Zpack. hehad a rapid Covid-19 test that day which was negative, and he also had a send off test taken which is still pending. He now feels much better and he went back to work today.    Observations/Objective: Patient sounds cheerful and well on the phone. I do not appreciate any SOB. Speech and thought processing are grossly intact. Patient reported vitals:  Assessment and Plan: Pneumonia, he is recovering well. For the ganglion cyst, we will refer him to Hand Surgery. Alysia Penna, MD   Follow Up Instructions:     [phone] 5-10 [phone] 11-20 [id number] 21-30 I did not refer this patient for an OV in the next 24 hours for  this/these issue(s).  I discussed the assessment and treatment plan with the patient. The patient was provided an opportunity to ask questions and all were answered. The patient agreed with the plan and demonstrated an understanding of the instructions.   The patient was advised to call back or seek an in-person evaluation if the symptoms worsen or if the condition fails to improve as anticipated.  I provided 16 minutes of non-face-to-face time during this encounter.   Alysia Penna, MD

## 2019-06-22 ENCOUNTER — Other Ambulatory Visit: Payer: Self-pay | Admitting: Orthopedic Surgery

## 2019-06-22 DIAGNOSIS — R2231 Localized swelling, mass and lump, right upper limb: Secondary | ICD-10-CM

## 2019-08-13 ENCOUNTER — Other Ambulatory Visit: Payer: Self-pay

## 2019-08-13 ENCOUNTER — Ambulatory Visit
Admission: RE | Admit: 2019-08-13 | Discharge: 2019-08-13 | Disposition: A | Discharge status: Home / Self Care | Payer: 59 | Source: Ambulatory Visit | Attending: Orthopedic Surgery | Admitting: Orthopedic Surgery

## 2019-08-13 DIAGNOSIS — R2231 Localized swelling, mass and lump, right upper limb: Secondary | ICD-10-CM

## 2019-08-13 MED ORDER — GADOBENATE DIMEGLUMINE 529 MG/ML IV SOLN
20.0000 mL | Freq: Once | INTRAVENOUS | Status: AC | PRN
  Administered 2019-08-13: 20 mL via INTRAVENOUS

## 2019-09-02 ENCOUNTER — Other Ambulatory Visit: Payer: Self-pay | Admitting: Orthopedic Surgery

## 2019-09-10 ENCOUNTER — Encounter (HOSPITAL_BASED_OUTPATIENT_CLINIC_OR_DEPARTMENT_OTHER): Payer: Self-pay | Admitting: Orthopedic Surgery

## 2019-09-10 ENCOUNTER — Other Ambulatory Visit: Payer: Self-pay

## 2019-09-15 ENCOUNTER — Other Ambulatory Visit (HOSPITAL_COMMUNITY)
Admission: RE | Admit: 2019-09-15 | Discharge: 2019-09-15 | Disposition: A | Discharge status: Home / Self Care | Payer: 59 | Source: Ambulatory Visit | Attending: Orthopedic Surgery | Admitting: Orthopedic Surgery

## 2019-09-15 ENCOUNTER — Other Ambulatory Visit: Payer: Self-pay

## 2019-09-15 ENCOUNTER — Encounter (HOSPITAL_BASED_OUTPATIENT_CLINIC_OR_DEPARTMENT_OTHER)
Admission: RE | Admit: 2019-09-15 | Discharge: 2019-09-15 | Disposition: A | Discharge status: Home / Self Care | Payer: 59 | Source: Ambulatory Visit | Attending: Orthopedic Surgery | Admitting: Orthopedic Surgery

## 2019-09-15 DIAGNOSIS — Z01810 Encounter for preprocedural cardiovascular examination: Secondary | ICD-10-CM | POA: Insufficient documentation

## 2019-09-15 DIAGNOSIS — Z20822 Contact with and (suspected) exposure to covid-19: Secondary | ICD-10-CM | POA: Insufficient documentation

## 2019-09-15 DIAGNOSIS — Z01812 Encounter for preprocedural laboratory examination: Secondary | ICD-10-CM | POA: Diagnosis not present

## 2019-09-15 LAB — BASIC METABOLIC PANEL
Anion gap: 6 (ref 5–15)
BUN: 14 mg/dL (ref 6–20)
CO2: 28 mmol/L (ref 22–32)
Calcium: 9.2 mg/dL (ref 8.9–10.3)
Chloride: 104 mmol/L (ref 98–111)
Creatinine, Ser: 1.11 mg/dL (ref 0.61–1.24)
GFR calc Af Amer: >60 mL/min (ref >60)
GFR calc non Af Amer: >60 mL/min (ref >60)
Glucose, Bld: 104 mg/dL — ABNORMAL HIGH (ref 70–99)
Potassium: 4.3 mmol/L (ref 3.5–5.1)
Sodium: 138 mmol/L (ref 135–145)

## 2019-09-15 LAB — SARS CORONAVIRUS 2 (TAT 6-24 HRS): SARS Coronavirus 2: NEGATIVE

## 2019-09-15 NOTE — Progress Notes (Signed)
Patient seen by Dr. Sabra Heck for an anesthesia consult. Okay to proceed with surgery on 09/18/19

## 2019-09-18 ENCOUNTER — Encounter (HOSPITAL_BASED_OUTPATIENT_CLINIC_OR_DEPARTMENT_OTHER)
Admission: RE | Disposition: A | Discharge status: Home / Self Care | Payer: Self-pay | Source: Home / Self Care | Attending: Orthopedic Surgery

## 2019-09-18 ENCOUNTER — Other Ambulatory Visit: Payer: Self-pay | Admitting: General Surgery

## 2019-09-18 ENCOUNTER — Ambulatory Visit (HOSPITAL_BASED_OUTPATIENT_CLINIC_OR_DEPARTMENT_OTHER): Payer: 59 | Admitting: Anesthesiology

## 2019-09-18 ENCOUNTER — Encounter (HOSPITAL_BASED_OUTPATIENT_CLINIC_OR_DEPARTMENT_OTHER): Payer: Self-pay | Admitting: Orthopedic Surgery

## 2019-09-18 ENCOUNTER — Other Ambulatory Visit: Payer: Self-pay

## 2019-09-18 ENCOUNTER — Ambulatory Visit (HOSPITAL_BASED_OUTPATIENT_CLINIC_OR_DEPARTMENT_OTHER)
Admission: RE | Admit: 2019-09-18 | Discharge: 2019-09-18 | Disposition: A | Discharge status: Home / Self Care | Payer: 59 | Attending: Orthopedic Surgery | Admitting: Orthopedic Surgery

## 2019-09-18 DIAGNOSIS — G4730 Sleep apnea, unspecified: Secondary | ICD-10-CM | POA: Insufficient documentation

## 2019-09-18 DIAGNOSIS — I10 Essential (primary) hypertension: Secondary | ICD-10-CM | POA: Insufficient documentation

## 2019-09-18 DIAGNOSIS — Z6841 Body Mass Index (BMI) 40.0 and over, adult: Secondary | ICD-10-CM | POA: Diagnosis not present

## 2019-09-18 DIAGNOSIS — Z791 Long term (current) use of non-steroidal anti-inflammatories (NSAID): Secondary | ICD-10-CM | POA: Diagnosis not present

## 2019-09-18 DIAGNOSIS — Z8249 Family history of ischemic heart disease and other diseases of the circulatory system: Secondary | ICD-10-CM | POA: Insufficient documentation

## 2019-09-18 DIAGNOSIS — F17210 Nicotine dependence, cigarettes, uncomplicated: Secondary | ICD-10-CM | POA: Diagnosis not present

## 2019-09-18 DIAGNOSIS — D2111 Benign neoplasm of connective and other soft tissue of right upper limb, including shoulder: Secondary | ICD-10-CM | POA: Insufficient documentation

## 2019-09-18 DIAGNOSIS — Z79899 Other long term (current) drug therapy: Secondary | ICD-10-CM | POA: Diagnosis not present

## 2019-09-18 DIAGNOSIS — Z881 Allergy status to other antibiotic agents status: Secondary | ICD-10-CM | POA: Diagnosis not present

## 2019-09-18 DIAGNOSIS — R2231 Localized swelling, mass and lump, right upper limb: Secondary | ICD-10-CM | POA: Diagnosis present

## 2019-09-18 HISTORY — PX: MASS EXCISION: SHX2000

## 2019-09-18 SURGERY — EXCISION MASS
Anesthesia: Regional | Site: Wrist | Laterality: Right

## 2019-09-18 MED ORDER — MIDAZOLAM HCL 2 MG/2ML IJ SOLN
2.0000 mg | Freq: Once | INTRAMUSCULAR | Status: AC
  Administered 2019-09-18: 2 mg via INTRAVENOUS

## 2019-09-18 MED ORDER — HYDROCODONE-ACETAMINOPHEN 5-325 MG PO TABS: ORAL_TABLET | ORAL | 0 refills | Status: DC

## 2019-09-18 MED ORDER — ONDANSETRON HCL 4 MG/2ML IJ SOLN: 4.0000 mg | Freq: Once | INTRAMUSCULAR | Status: DC | PRN

## 2019-09-18 MED ORDER — VANCOMYCIN HCL IN DEXTROSE 1-5 GM/200ML-% IV SOLN
1000.0000 mg | INTRAVENOUS | Status: AC
  Administered 2019-09-18: 1000 mg via INTRAVENOUS

## 2019-09-18 MED ORDER — FENTANYL CITRATE (PF) 100 MCG/2ML IJ SOLN
INTRAMUSCULAR | Status: AC
  Filled 2019-09-18: qty 2

## 2019-09-18 MED ORDER — MIDAZOLAM HCL 2 MG/2ML IJ SOLN
INTRAMUSCULAR | Status: AC
  Filled 2019-09-18: qty 2

## 2019-09-18 MED ORDER — LACTATED RINGERS IV SOLN: INTRAVENOUS | Status: DC

## 2019-09-18 MED ORDER — BUPIVACAINE-EPINEPHRINE (PF) 0.5% -1:200000 IJ SOLN
INTRAMUSCULAR | Status: DC | PRN
Start: 2019-09-18 — End: 2019-09-18
  Administered 2019-09-18: 30 mL via PERINEURAL

## 2019-09-18 MED ORDER — ONDANSETRON HCL 4 MG/2ML IJ SOLN
INTRAMUSCULAR | Status: DC | PRN
  Administered 2019-09-18: 4 mg via INTRAVENOUS

## 2019-09-18 MED ORDER — HYDROMORPHONE HCL 1 MG/ML IJ SOLN: 0.2500 mg | INTRAMUSCULAR | Status: DC | PRN

## 2019-09-18 MED ORDER — PROPOFOL 500 MG/50ML IV EMUL
INTRAVENOUS | Status: DC | PRN
  Administered 2019-09-18: 50 ug/kg/min via INTRAVENOUS

## 2019-09-18 MED ORDER — VANCOMYCIN HCL IN DEXTROSE 1-5 GM/200ML-% IV SOLN
INTRAVENOUS | Status: AC
  Filled 2019-09-18: qty 200

## 2019-09-18 MED ORDER — MEPERIDINE HCL 25 MG/ML IJ SOLN: 6.2500 mg | INTRAMUSCULAR | Status: DC | PRN

## 2019-09-18 MED ORDER — FENTANYL CITRATE (PF) 100 MCG/2ML IJ SOLN
100.0000 ug | Freq: Once | INTRAMUSCULAR | Status: AC
  Administered 2019-09-18: 50 ug via INTRAVENOUS

## 2019-09-18 SURGICAL SUPPLY — 67 items
APL PRP STRL LF DISP 70% ISPRP (MISCELLANEOUS) ×1
APL SKNCLS STERI-STRIP NONHPOA (GAUZE/BANDAGES/DRESSINGS)
BENZOIN TINCTURE PRP APPL 2/3 (GAUZE/BANDAGES/DRESSINGS) IMPLANT
BLADE MINI RND TIP GREEN BEAV (BLADE) IMPLANT
BLADE SURG 15 STRL LF DISP TIS (BLADE) ×2 IMPLANT
BLADE SURG 15 STRL SS (BLADE) ×6
BNDG CMPR 9X4 STRL LF SNTH (GAUZE/BANDAGES/DRESSINGS)
BNDG COHESIVE 1X5 TAN STRL LF (GAUZE/BANDAGES/DRESSINGS) IMPLANT
BNDG COHESIVE 2X5 TAN STRL LF (GAUZE/BANDAGES/DRESSINGS) IMPLANT
BNDG CONFORM 2 STRL LF (GAUZE/BANDAGES/DRESSINGS) IMPLANT
BNDG ELASTIC 2X5.8 VLCR STR LF (GAUZE/BANDAGES/DRESSINGS) IMPLANT
BNDG ELASTIC 3X5.8 VLCR STR LF (GAUZE/BANDAGES/DRESSINGS) ×2 IMPLANT
BNDG ESMARK 4X9 LF (GAUZE/BANDAGES/DRESSINGS) IMPLANT
BNDG GAUZE 1X2.1 STRL (MISCELLANEOUS) IMPLANT
BNDG GAUZE ELAST 4 BULKY (GAUZE/BANDAGES/DRESSINGS) ×2 IMPLANT
BNDG PLASTER X FAST 3X3 WHT LF (CAST SUPPLIES) IMPLANT
BNDG PLSTR 9X3 FST ST WHT (CAST SUPPLIES)
CHLORAPREP W/TINT 26 (MISCELLANEOUS) ×3 IMPLANT
CLOSURE WOUND 1/2 X4 (GAUZE/BANDAGES/DRESSINGS)
CORD BIPOLAR FORCEPS 12FT (ELECTRODE) ×3 IMPLANT
COVER BACK TABLE 60X90IN (DRAPES) ×3 IMPLANT
COVER MAYO STAND STRL (DRAPES) ×3 IMPLANT
COVER WAND RF STERILE (DRAPES) IMPLANT
CUFF TOURN SGL QUICK 18X4 (TOURNIQUET CUFF) ×1 IMPLANT
CUFF TOURN SGL QUICK 24 (TOURNIQUET CUFF) ×3
CUFF TRNQT CYL 24X4X16.5-23 (TOURNIQUET CUFF) IMPLANT
DRAPE EXTREMITY T 121X128X90 (DISPOSABLE) ×3 IMPLANT
DRAPE SURG 17X23 STRL (DRAPES) ×3 IMPLANT
GAUZE SPONGE 4X4 12PLY STRL (GAUZE/BANDAGES/DRESSINGS) ×3 IMPLANT
GAUZE XEROFORM 1X8 LF (GAUZE/BANDAGES/DRESSINGS) ×3 IMPLANT
GLOVE BIO SURGEON STRL SZ 6.5 (GLOVE) ×1 IMPLANT
GLOVE BIO SURGEON STRL SZ7.5 (GLOVE) ×3 IMPLANT
GLOVE BIO SURGEONS STRL SZ 6.5 (GLOVE) ×1
GLOVE BIOGEL M 6.5 STRL (GLOVE) ×4 IMPLANT
GLOVE BIOGEL PI IND STRL 7.0 (GLOVE) IMPLANT
GLOVE BIOGEL PI IND STRL 8 (GLOVE) ×1 IMPLANT
GLOVE BIOGEL PI IND STRL 8.5 (GLOVE) IMPLANT
GLOVE BIOGEL PI INDICATOR 7.0 (GLOVE) ×2
GLOVE BIOGEL PI INDICATOR 8 (GLOVE) ×2
GLOVE BIOGEL PI INDICATOR 8.5 (GLOVE) ×2
GLOVE SURG ORTHO 8.0 STRL STRW (GLOVE) ×2 IMPLANT
GOWN STRL REUS W/ TWL LRG LVL3 (GOWN DISPOSABLE) ×1 IMPLANT
GOWN STRL REUS W/TWL LRG LVL3 (GOWN DISPOSABLE) ×6
GOWN STRL REUS W/TWL XL LVL3 (GOWN DISPOSABLE) ×5 IMPLANT
NDL HYPO 25X1 1.5 SAFETY (NEEDLE) ×1 IMPLANT
NEEDLE HYPO 25X1 1.5 SAFETY (NEEDLE) IMPLANT
NS IRRIG 1000ML POUR BTL (IV SOLUTION) ×3 IMPLANT
PACK BASIN DAY SURGERY FS (CUSTOM PROCEDURE TRAY) ×3 IMPLANT
PAD CAST 3X4 CTTN HI CHSV (CAST SUPPLIES) IMPLANT
PAD CAST 4YDX4 CTTN HI CHSV (CAST SUPPLIES) IMPLANT
PADDING CAST ABS 4INX4YD NS (CAST SUPPLIES) ×2
PADDING CAST ABS COTTON 4X4 ST (CAST SUPPLIES) ×1 IMPLANT
PADDING CAST COTTON 3X4 STRL (CAST SUPPLIES)
PADDING CAST COTTON 4X4 STRL (CAST SUPPLIES)
SLING ARM FOAM STRAP XLG (SOFTGOODS) ×2 IMPLANT
STOCKINETTE 4X48 STRL (DRAPES) ×3 IMPLANT
STRIP CLOSURE SKIN 1/2X4 (GAUZE/BANDAGES/DRESSINGS) IMPLANT
SUT ETHILON 3 0 PS 1 (SUTURE) IMPLANT
SUT ETHILON 4 0 PS 2 18 (SUTURE) ×3 IMPLANT
SUT ETHILON 5 0 P 3 18 (SUTURE)
SUT NYLON ETHILON 5-0 P-3 1X18 (SUTURE) IMPLANT
SUT VIC AB 4-0 P2 18 (SUTURE) ×2 IMPLANT
SUT VICRYL 4-0 PS2 18IN ABS (SUTURE) ×2 IMPLANT
SYR BULB EAR ULCER 3OZ GRN STR (SYRINGE) ×3 IMPLANT
SYR CONTROL 10ML LL (SYRINGE) ×1 IMPLANT
TOWEL GREEN STERILE FF (TOWEL DISPOSABLE) ×6 IMPLANT
UNDERPAD 30X36 HEAVY ABSORB (UNDERPADS AND DIAPERS) ×3 IMPLANT

## 2019-09-18 NOTE — Transfer of Care (Signed)
Immediate Anesthesia Transfer of Care Note  Patient: Ladarius Seubert  Procedure(s) Performed: EXCISION MASS RIGHT WRIST (Right Wrist)  Patient Location: PACU  Anesthesia Type:General and Regional  Level of Consciousness: awake, alert  and oriented  Airway & Oxygen Therapy: Patient Spontanous Breathing and Patient connected to face mask oxygen  Post-op Assessment: Report given to RN and Post -op Vital signs reviewed and stable  Post vital signs: Reviewed and stable  Last Vitals:  Vitals Value Taken Time  BP    Temp    Pulse 70 09/18/19 1433  Resp 21 09/18/19 1433  SpO2 99 % 09/18/19 1433  Vitals shown include unvalidated device data.  Last Pain:  Vitals:   09/18/19 1143  TempSrc: Oral  PainSc: 0-No pain         Complications: No complications documented.

## 2019-09-18 NOTE — Anesthesia Postprocedure Evaluation (Signed)
Anesthesia Post Note  Patient: Justin Norman  Procedure(s) Performed: EXCISION MASS RIGHT WRIST (Right Wrist)     Patient location during evaluation: PACU Anesthesia Type: Regional Level of consciousness: awake and alert and patient cooperative Pain management: pain level controlled Vital Signs Assessment: post-procedure vital signs reviewed and stable Respiratory status: spontaneous breathing and respiratory function stable Cardiovascular status: stable Anesthetic complications: no   No complications documented.  Last Vitals:  Vitals:   09/18/19 1445 09/18/19 1458  BP: (!) 181/92 (!) 169/105  Pulse: 69 67  Resp: (!) 32 16  Temp:  36.6 C  SpO2: 98% 95%    Last Pain:  Vitals:   09/18/19 1458  TempSrc:   PainSc: 0-No pain                 Nolene Rocks DAVID

## 2019-09-18 NOTE — Op Note (Signed)
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   ASSISTANT: Daryll Brod, MD   ANESTHESIA:  Regional with sedation   INTRAVENOUS FLUIDS:  Per anesthesia flow sheet.   ESTIMATED BLOOD LOSS:  Minimal.   COMPLICATIONS:  None.   SPECIMENS:   Right wrist mass to pathology   TOURNIQUET TIME:    Total Tourniquet Time Documented: Upper Arm (Right) - 49 minutes Total: Upper Arm (Right) - 49 minutes    DISPOSITION:  Stable to PACU.   INDICATIONS: 49 year old male with mass on the radial side of his right wrist.  This is bothersome to him.  He wishes to have it removed. Risks, benefits and alternatives of surgery were discussed including the risks of blood loss, infection, damage to nerves, vessels, tendons, ligaments, bone for surgery, need for additional surgery, complications with wound healing, continued pain, recurrence of mass, stiffness.  He voiced understanding of these risks and elected to proceed.  OPERATIVE COURSE:  After being identified preoperatively by myself,  the patient and I agreed on the procedure and site of the procedure.  The surgical site was marked.  Surgical consent had been signed. He was given IV antibiotics as preoperative antibiotic prophylaxis. He was transferred to the operating room and placed on the operating table in supine position with the Right upper extremity on an arm board.  A regional block had been performed by anesthesia in preoperative holding.   Right upper extremity was prepped and draped in normal sterile orthopedic fashion.  A surgical pause was performed between the  surgeons, anesthesia, and operating room staff and all were in agreement as to the patient, procedure, and site of procedure.  Tourniquet at the proximal aspect of the extremity was inflated to 250 mmHg after exsanguination of the arm with an Esmarch bandage.    Incision was made over the mass at the radial side of the wrist.  This is carried into the subcutaneous tissues by spreading technique.  Bipolar electrocautery is used to obtain hemostasis.  Branches of the dorsal sensory branch of the radial nerve were identified and protected.  The mass was adherent to the surrounding tissues.  It was carefully freed up.  The extensor pollicis brevis tendon coursed through the mass.  It was adherent to the abductor pollicis longus tendon underneath.  The deep branch of the radial artery was identified and protected.  The mass was carefully freed up from surrounding soft tissues.  It went deep to the APL tendon.  Once all soft tissue attachments had been released the mass was incised to release it from the EPB tendon.  It was sent to pathology for examination.  It measured 4 x 4 cm in diameter.  The wound was copiously irrigated with sterile saline.  It was examined.  No remaining mass was noted.  The subcutaneous tissues were closed with inverted interrupted 4-0 Vicryl sutures and the skin was closed with 4-0 nylon in a horizontal mattress fashion.  The wound was dressed with sterile Xeroform 4 x 4's and wrapped with a Kerlix bandage.  A thumb spica splint was placed and  wrapped with Kerlix and Ace bandage.  The tourniquet was deflated at 49 minutes.  Fingertips were pink with brisk capillary refill after deflation of tourniquet.  The operative  drapes were broken down.  The patient was awoken from anesthesia safely.  He was transferred back to the stretcher and taken to PACU in stable condition.  I will see him back in the office in 1 week for postoperative followup.  I will give him a prescription for Norco 5/325 1-2  tabs PO q6 hours prn pain, dispense # 20.   Leanora Cover, MD Electronically signed, 09/18/19

## 2019-09-18 NOTE — Anesthesia Procedure Notes (Signed)
Anesthesia Regional Block: Supraclavicular block   Pre-Anesthetic Checklist: ,, timeout performed, Correct Patient, Correct Site, Correct Laterality, Correct Procedure, Correct Position, site marked, Risks and benefits discussed,  Surgical consent,  Pre-op evaluation,  At surgeon's request and post-op pain management  Laterality: Right  Prep: chloraprep       Needles:   Needle Type: Echogenic Stimulator Needle     Needle Length: 9cm  Needle Gauge: 21     Additional Needles:   Procedures:, nerve stimulator,,,,,,,   Nerve Stimulator or Paresthesia:  Response: 0.4 mA,   Additional Responses:   Narrative:  Start time: 09/18/2019 12:45 PM End time: 09/18/2019 12:55 PM Injection made incrementally with aspirations every 5 mL.  Performed by: Personally  Anesthesiologist: Lillia Abed, MD  Additional Notes: Monitors applied. Patient sedated. Sterile prep and drape,hand hygiene and sterile gloves were used. Relevant anatomy identified.Needle position confirmed.Local anesthetic injected incrementally after negative aspiration. Local anesthetic spread visualized around nerve(s). Vascular puncture avoided. No complications. Image printed for medical record.The patient tolerated the procedure well.

## 2019-09-18 NOTE — Progress Notes (Signed)
Assisted Dr. Ossey with right, ultrasound guided, supraclavicular block. Side rails up, monitors on throughout procedure. See vital signs in flow sheet. Tolerated Procedure well. 

## 2019-09-18 NOTE — H&P (Signed)
Justin Norman is an 49 y.o. male.   Chief Complaint: right hand mass HPI: 49 yo male with mass right hand/wrist.  It is bothersome to him and he wishes to have it removed.  Allergies:  Allergies  Allergen Reactions  . Keflex [Cephalexin]     Past Medical History:  Diagnosis Date  . Anxiety    during a divorce  . Dry skin   . ERECTILE DYSFUNCTION 03/03/2007  . GERD (gastroesophageal reflux disease)    not taking meds  . Headache    headaches  . HYPERTENSION 07/03/2007  . HYPERTENSION NEC 03/03/2007  . Kidney stone   . Obesities, morbid ([place])   . Sleep apnea 09/2012   does not use Cpap  . Tear of medial meniscus of right knee 11/20/2013    Past Surgical History:  Procedure Laterality Date  . KNEE ARTHROSCOPY Right 11/20/2013   Procedure: RIGHT KNEE ARTHROSCOPY WITH MEDIAL MENISCECTOMY ;  Surgeon: Johnny Bridge, MD;  Location: [place];  Service: Orthopedics;  Laterality: Right;  . vocal cord abscess surgery      Family History: Family History  Problem Relation Age of Onset  . Hypertension Mother   . Arthritis Mother   . Diabetes Mother   . Diabetes Sister   . Diabetes Sister   . Hypertension Sister   . Arrhythmia Sister        has pacemaker    Social History:   reports that he has been smoking cigarettes. He has a 7.50 pack-year smoking history. He has never used smokeless tobacco. He reports current alcohol use. He reports that he does not use drugs.  Medications: Medications Prior to Admission  Medication Sig Dispense Refill  . lisinopril-hydrochlorothiazide (ZESTORETIC) 20-25 MG tablet Take 1 tablet by mouth daily. 90 tablet 1  . meloxicam (MOBIC) 15 MG tablet Take 1 tablet (15 mg total) by mouth daily. 90 tablet 1    No results found for this or any previous visit (from the past 48 hour(s)).  No results found.   A comprehensive review of systems was negative.  Blood pressure (!) 160/90, pulse 79, temperature 98 F (36.7 C), temperature source Oral,  resp. rate 13, height 6\' 2"  (1.88 m), weight (!) 169.8 kg, SpO2 100 %.  General appearance: alert, cooperative and appears stated age Head: Normocephalic, without obvious abnormality, atraumatic Neck: supple, symmetrical, trachea midline Cardio: regular rate and rhythm Resp: clear to auscultation bilaterally Extremities: Intact sensation and capillary refill all digits.  +epl/fpl/io.  No wounds. Mass radial side right wrist. Pulses: 2+ and symmetric Skin: Skin color, texture, turgor normal. No rashes or lesions Neurologic: Grossly normal Incision/Wound: none  Assessment/Plan Right hand/wrist mass.  Non operative and operative treatment options have been discussed with the patient and patient wishes to proceed with operative treatment. Risks, benefits, and alternatives of surgery have been discussed and the patient agrees with the plan of care.   Leanora Cover 09/18/2019, 1:16 PM

## 2019-09-18 NOTE — Op Note (Signed)
I assisted Surgeon(s) and Role:    Leanora Cover, MD - Primary on the Procedure(s): EXCISION MASS RIGHT WRIST on 09/18/2019.  I provided assistance on this case as follows: setup,approach, identification of the mass, radial nerve and artery, removal of the mass and closure of the wound, Electronically signed by: Daryll Brod, MD Date: 09/18/2019 Time: 2:30 PM

## 2019-09-18 NOTE — Anesthesia Preprocedure Evaluation (Signed)
Anesthesia Evaluation  Patient identified by MRN, date of birth, ID band Patient awake    Reviewed: Allergy & Precautions, NPO status , Patient's Chart, lab work & pertinent test results  Airway Mallampati: II  TM Distance: >3 FB Neck ROM: Full    Dental   Pulmonary sleep apnea , Current Smoker and Patient abstained from smoking.,    Pulmonary exam normal        Cardiovascular hypertension, Pt. on medications Normal cardiovascular exam     Neuro/Psych Anxiety    GI/Hepatic GERD  Medicated and Controlled,  Endo/Other    Renal/GU      Musculoskeletal   Abdominal   Peds  Hematology   Anesthesia Other Findings   Reproductive/Obstetrics                             Anesthesia Physical Anesthesia Plan  ASA: III  Anesthesia Plan: Regional   Post-op Pain Management:    Induction: Intravenous  PONV Risk Score and Plan:   Airway Management Planned: Nasal Cannula  Additional Equipment:   Intra-op Plan:   Post-operative Plan:   Informed Consent: I have reviewed the patients History and Physical, chart, labs and discussed the procedure including the risks, benefits and alternatives for the proposed anesthesia with the patient or authorized representative who has indicated his/her understanding and acceptance.       Plan Discussed with: CRNA and Surgeon  Anesthesia Plan Comments:         Anesthesia Quick Evaluation

## 2019-09-18 NOTE — Discharge Instructions (Addendum)

## 2019-09-21 ENCOUNTER — Encounter (HOSPITAL_BASED_OUTPATIENT_CLINIC_OR_DEPARTMENT_OTHER): Payer: Self-pay | Admitting: Orthopedic Surgery

## 2019-09-21 ENCOUNTER — Other Ambulatory Visit: Payer: Self-pay | Admitting: Internal Medicine

## 2019-09-21 DIAGNOSIS — I10 Essential (primary) hypertension: Secondary | ICD-10-CM

## 2019-09-22 LAB — SURGICAL PATHOLOGY

## 2020-05-12 ENCOUNTER — Ambulatory Visit (INDEPENDENT_AMBULATORY_CARE_PROVIDER_SITE_OTHER): Payer: 59 | Admitting: Internal Medicine

## 2020-05-12 ENCOUNTER — Encounter: Payer: Self-pay | Admitting: Internal Medicine

## 2020-05-12 ENCOUNTER — Ambulatory Visit (INDEPENDENT_AMBULATORY_CARE_PROVIDER_SITE_OTHER)
Admission: RE | Admit: 2020-05-12 | Discharge: 2020-05-12 | Disposition: A | Discharge status: Home / Self Care | Payer: 59 | Source: Ambulatory Visit | Attending: Internal Medicine | Admitting: Internal Medicine

## 2020-05-12 ENCOUNTER — Other Ambulatory Visit: Payer: Self-pay

## 2020-05-12 VITALS — BP 130/85 | HR 77 | Temp 98.3°F | Wt 379.5 lb

## 2020-05-12 DIAGNOSIS — R319 Hematuria, unspecified: Secondary | ICD-10-CM | POA: Diagnosis not present

## 2020-05-12 LAB — POCT URINALYSIS DIPSTICK
Bilirubin, UA: NEGATIVE
Glucose, UA: NEGATIVE
Ketones, UA: NEGATIVE
Leukocytes, UA: NEGATIVE
Nitrite, UA: NEGATIVE
Protein, UA: POSITIVE — AB
Spec Grav, UA: 1.02 (ref 1.010–1.025)
Urobilinogen, UA: 0.2 E.U./dL
pH, UA: 6 (ref 5.0–8.0)

## 2020-05-13 ENCOUNTER — Encounter: Payer: Self-pay | Admitting: Internal Medicine

## 2020-05-13 ENCOUNTER — Other Ambulatory Visit: Payer: Self-pay | Admitting: Internal Medicine

## 2020-05-13 DIAGNOSIS — R319 Hematuria, unspecified: Secondary | ICD-10-CM

## 2020-05-13 LAB — URINE CULTURE
MICRO NUMBER:: 11716675
Result:: NO GROWTH
SPECIMEN QUALITY:: ADEQUATE

## 2020-05-13 NOTE — Progress Notes (Signed)
Acute office Visit     This visit occurred during the SARS-CoV-2 public health emergency.  Safety protocols were in place, including screening questions prior to the visit, additional usage of staff PPE, and extensive cleaning of exam room while observing appropriate contact time as indicated for disinfecting solutions.    CC/Reason for Visit: "Blood in my urine"  HPI: Justin Norman is a 50 y.o. male who is coming in today for the above mentioned reasons.  He was diagnosed with pneumonia 4 days ago at an urgent care and was started on levofloxacin, Tussionex and a prednisone taper.  The next day he noticed blood in his urine.  He has had no dysuria, no suprapubic pain no hesitancy or frequency.  Yesterday and today he has had blood clots in the toilet.  He tells me that he has had kidney stones in the past and they presented like this before.  Past Medical/Surgical History: Past Medical History:  Diagnosis Date  . Anxiety    during a divorce  . Dry skin   . ERECTILE DYSFUNCTION 03/03/2007  . GERD (gastroesophageal reflux disease)    not taking meds  . Headache    headaches  . HYPERTENSION 07/03/2007  . HYPERTENSION NEC 03/03/2007  . Kidney stone   . Obesities, morbid ([place])   . Sleep apnea 09/2012   does not use Cpap  . Tear of medial meniscus of right knee 11/20/2013    Past Surgical History:  Procedure Laterality Date  . KNEE ARTHROSCOPY Right 11/20/2013   Procedure: RIGHT KNEE ARTHROSCOPY WITH MEDIAL MENISCECTOMY ;  Surgeon: Johnny Bridge, MD;  Location: [facility];  Service: Orthopedics;  Laterality: Right;  . MASS EXCISION Right 09/18/2019   Procedure: EXCISION MASS RIGHT WRIST;  Surgeon: Leanora Cover, MD;  Location: [facility];  Service: Orthopedics;  Laterality: Right;  GENERAL OR AXILLARY BLOCK  . vocal cord abscess surgery      Social History:  reports that he has been smoking cigarettes. He has a 7.50 pack-year smoking history. He has never used  smokeless tobacco. He reports current alcohol use. He reports that he does not use drugs.  Allergies: Allergies  Allergen Reactions  . Keflex [Cephalexin]     Family History:  Family History  Problem Relation Age of Onset  . Hypertension Mother   . Arthritis Mother   . Diabetes Mother   . Diabetes Sister   . Diabetes Sister   . Hypertension Sister   . Arrhythmia Sister        has pacemaker     Current Outpatient Medications:  .  chlorpheniramine-HYDROcodone (TUSSIONEX) 10-8 MG/5ML SUER, SMARTSIG:5 Milliliter(s) By Mouth Every 12 Hours, Disp: , Rfl:  .  HYDROcodone-acetaminophen (NORCO) 5-325 MG tablet, 1-2 tabs po q6 hours prn pain, Disp: 20 tablet, Rfl: 0 .  levofloxacin (LEVAQUIN) 750 MG tablet, Take 750 mg by mouth daily., Disp: , Rfl:  .  lisinopril-hydrochlorothiazide (ZESTORETIC) 20-25 MG tablet, TAKE 1 TABLET BY MOUTH EVERY DAY, Disp: 90 tablet, Rfl: 0 .  meloxicam (MOBIC) 15 MG tablet, Take 1 tablet (15 mg total) by mouth daily., Disp: 90 tablet, Rfl: 1 .  predniSONE (DELTASONE) 20 MG tablet, Take by mouth., Disp: , Rfl:   Review of Systems:  Constitutional: Denies fever, chills, diaphoresis, appetite change and fatigue.  HEENT: Denies photophobia, eye pain, redness, hearing loss, ear pain, congestion, sore throat, rhinorrhea, sneezing, mouth sores, trouble swallowing, neck pain, neck stiffness and tinnitus.   Respiratory:  Denies SOB, DOE, cough, chest tightness,  and wheezing.   Cardiovascular: Denies chest pain, palpitations and leg swelling.  Gastrointestinal: Denies nausea, vomiting, abdominal pain, diarrhea, constipation, blood in stool and abdominal distention.  Genitourinary: Denies dysuria, urgency, frequency,  flank pain and difficulty urinating.  Endocrine: Denies: hot or cold intolerance, sweats, changes in hair or nails, polyuria, polydipsia. Musculoskeletal: Denies myalgias, back pain, joint swelling, arthralgias and gait problem.  Skin: Denies pallor, rash  and wound.  Neurological: Denies dizziness, seizures, syncope, weakness, light-headedness, numbness and headaches.  Hematological: Denies adenopathy. Easy bruising, personal or family bleeding history  Psychiatric/Behavioral: Denies suicidal ideation, mood changes, confusion, nervousness, sleep disturbance and agitation    Physical Exam: Vitals:   05/13/20 0656  BP: 130/85  Pulse: 77  Temp: 98.3 F (36.8 C)  SpO2: 97%  Weight: (!) 379 lb 8 oz (172.1 kg)    Body mass index is 48.72 kg/m.   Constitutional: NAD, calm, comfortable, obese Eyes: PERRL, lids and conjunctivae normal ENMT: Mucous membranes are moist. Abdomen: no tenderness, no masses palpated. No hepatosplenomegaly. Bowel sounds positive.  Neurologic: Grossly intact and nonfocal.  Psychiatric: Normal judgment and insight. Alert and oriented x 3. Normal mood.    Impression and Plan:  Hematuria, unspecified type  - Plan: POCT urinalysis dipstick, CT RENAL STONE STUDY, Urine Culture, Ambulatory referral to Urology, Urine Culture -Dipstick in office only with blood, no nitrates or leukocytes. -He is already on levofloxacin for pneumonia so in case of a UTI this should also be sufficient coverage, urine culture has been sent. -Given his history of kidney stones I will go ahead and send for CT stone protocol and I will also place a referral for urology.    Lelon Frohlich, MD [facility] at [facility]

## 2020-05-13 NOTE — Telephone Encounter (Signed)
Spoke with patient and reviewed lab result.

## 2020-12-30 ENCOUNTER — Encounter: Payer: Self-pay | Admitting: Internal Medicine

## 2020-12-30 ENCOUNTER — Ambulatory Visit (INDEPENDENT_AMBULATORY_CARE_PROVIDER_SITE_OTHER): Payer: 59 | Admitting: Internal Medicine

## 2020-12-30 VITALS — BP 180/100 | HR 82 | Temp 98.1°F | Wt 378.7 lb

## 2020-12-30 DIAGNOSIS — Z23 Encounter for immunization: Secondary | ICD-10-CM | POA: Diagnosis not present

## 2020-12-30 DIAGNOSIS — I10 Essential (primary) hypertension: Secondary | ICD-10-CM | POA: Diagnosis not present

## 2020-12-30 DIAGNOSIS — M545 Low back pain, unspecified: Secondary | ICD-10-CM

## 2020-12-30 DIAGNOSIS — M160 Bilateral primary osteoarthritis of hip: Secondary | ICD-10-CM | POA: Diagnosis not present

## 2020-12-30 LAB — POCT URINALYSIS DIPSTICK
Bilirubin, UA: NEGATIVE
Blood, UA: NEGATIVE
Glucose, UA: NEGATIVE
Ketones, UA: NEGATIVE
Leukocytes, UA: NEGATIVE
Nitrite, UA: NEGATIVE
Protein, UA: POSITIVE — AB
Spec Grav, UA: 1.025 (ref 1.010–1.025)
Urobilinogen, UA: 0.2 E.U./dL
pH, UA: 6 (ref 5.0–8.0)

## 2020-12-30 MED ORDER — MELOXICAM 15 MG PO TABS: 15.0000 mg | ORAL_TABLET | Freq: Every day | ORAL | 1 refills | Status: DC

## 2020-12-30 NOTE — Patient Instructions (Signed)
-  Nice seeing you today!!  -Start taking BP meds every day.  -Schedule follow up in 2 months for your physical. Please come in fasting that day.

## 2020-12-30 NOTE — Progress Notes (Signed)
Acute office Visit     This visit occurred during the SARS-CoV-2 public health emergency.  Safety protocols were in place, including screening questions prior to the visit, additional usage of staff PPE, and extensive cleaning of exam room while observing appropriate contact time as indicated for disinfecting solutions.    CC/Reason for Visit: Discuss some acute concerns  HPI: Justin Norman is a 50 y.o. male who is coming in today for the above mentioned reasons.   1.  He has not been compliant with his blood pressure medication as he sometimes forgets to take it.  2.  He has been having right lower back pain without dysuria, no hematuria.  3.  He is requesting a flu vaccine.  4.  He needs refills of his Mobic.  Past Medical/Surgical History: Past Medical History:  Diagnosis Date   Anxiety    during a divorce   Dry skin    ERECTILE DYSFUNCTION 03/03/2007   GERD (gastroesophageal reflux disease)    not taking meds   Headache    headaches   HYPERTENSION 07/03/2007   HYPERTENSION NEC 03/03/2007   Kidney stone    Obesities, morbid ([place])    Sleep apnea 09/2012   does not use Cpap   Tear of medial meniscus of right knee 11/20/2013    Past Surgical History:  Procedure Laterality Date   KNEE ARTHROSCOPY Right 11/20/2013   Procedure: RIGHT KNEE ARTHROSCOPY WITH MEDIAL MENISCECTOMY ;  Surgeon: Johnny Bridge, MD;  Location: [facility];  Service: Orthopedics;  Laterality: Right;   MASS EXCISION Right 09/18/2019   Procedure: EXCISION MASS RIGHT WRIST;  Surgeon: Leanora Cover, MD;  Location: [facility];  Service: Orthopedics;  Laterality: Right;  GENERAL OR AXILLARY BLOCK   vocal cord abscess surgery      Social History:  reports that he has been smoking cigarettes. He has a 7.50 pack-year smoking history. He has never used smokeless tobacco. He reports current alcohol use. He reports that he does not use drugs.  Allergies: Allergies  Allergen Reactions   Keflex  [Cephalexin]     Family History:  Family History  Problem Relation Age of Onset   Hypertension Mother    Arthritis Mother    Diabetes Mother    Diabetes Sister    Diabetes Sister    Hypertension Sister    Arrhythmia Sister        has pacemaker     Current Outpatient Medications:    lisinopril-hydrochlorothiazide (ZESTORETIC) 20-25 MG tablet, TAKE 1 TABLET BY MOUTH EVERY DAY (Patient not taking: Reported on 12/30/2020), Disp: 90 tablet, Rfl: 0   meloxicam (MOBIC) 15 MG tablet, Take 1 tablet (15 mg total) by mouth daily., Disp: 90 tablet, Rfl: 1  Review of Systems:  Constitutional: Denies fever, chills, diaphoresis, appetite change. HEENT: Denies photophobia, eye pain, redness, hearing loss, ear pain, congestion, sore throat, rhinorrhea, sneezing, mouth sores, trouble swallowing, neck pain, neck stiffness and tinnitus.   Respiratory: Denies SOB, DOE, cough, chest tightness,  and wheezing.   Cardiovascular: Denies chest pain, palpitations and leg swelling.  Gastrointestinal: Denies nausea, vomiting, abdominal pain, diarrhea, constipation, blood in stool and abdominal distention.  Genitourinary: Denies dysuria, urgency, frequency, hematuria, flank pain and difficulty urinating.  Endocrine: Denies: hot or cold intolerance, sweats, changes in hair or nails, polyuria, polydipsia. Musculoskeletal: Positive for myalgias, back pain, joint swelling, arthralgias and gait problem.  Skin: Denies pallor, rash and wound.  Neurological: Denies dizziness, seizures, syncope, weakness, light-headedness,  numbness and headaches.  Hematological: Denies adenopathy. Easy bruising, personal or family bleeding history  Psychiatric/Behavioral: Denies suicidal ideation, mood changes, confusion, nervousness, sleep disturbance and agitation    Physical Exam: Vitals:   12/30/20 1525  BP: (!) 180/100  Pulse: 82  Temp: 98.1 F (36.7 C)  TempSrc: Oral  SpO2: 97%  Weight: (!) 378 lb 11.2 oz (171.8 kg)     Body mass index is 48.62 kg/m.   Constitutional: NAD, calm, comfortable, obese Eyes: PERRL, lids and conjunctivae normal ENMT: Mucous membranes are moist.  Respiratory: clear to auscultation bilaterally, no wheezing, no crackles. Normal respiratory effort. No accessory muscle use.  Cardiovascular: Regular rate and rhythm, no murmurs / rubs / gallops. No extremity edema.  Neurologic: Grossly intact and nonfocal Psychiatric: Normal judgment and insight. Alert and oriented x 3. Normal mood.    Impression and Plan:  Right low back pain, unspecified chronicity, unspecified whether sciatica present  - Plan: POCT urinalysis dipstick -In office urine dipstick is normal, not likely UTI. -Suspect musculoskeletal, advised icing, as needed NSAIDs, back stretches.  Can consider referral to physical therapy if no improvement in 4 to 6 weeks.  Given lack of red flag symptoms, do not believe imaging is necessary today.  Essential hypertension -Uncontrolled, nonadherent to medical therapy.  He states the biggest barrier is remembering to take medications.  I have helped him set an alarm on his phone for this purpose.  Need for influenza vaccination  - Plan: Flu Vaccine QUAD 6+ mos PF IM (Fluarix Quad PF)  Primary osteoarthritis of both hips  - Plan: meloxicam (MOBIC) 15 MG tablet  Time spent: 32 minutes reviewing chart, interviewing and examining patient and formulating plan of care.   Patient Instructions  -Nice seeing you today!!  -Start taking BP meds every day.  -Schedule follow up in 2 months for your physical. Please come in fasting that day.   Lelon Frohlich, MD [facility] at [facility]

## 2021-01-18 IMAGING — MR MR WRIST*R* WO/W CM
10 series · 40 of 40 positions shown · IV contrast (MULTIHANCE)
Comparison: None.

CLINICAL DATA: Right wrist swelling.

EXAM:
MR OF THE RIGHT WRIST WITHOUT AND WITH CONTRAST
TECHNIQUE: Multiplanar multisequence MR imaging of the right wrist was
performed both before and after the administration of intravenous
contrast.
CONTRAST:  20mL MULTIHANCE GADOBENATE DIMEGLUMINE 529 MG/ML IV SOLN

[Series 3: T2 fat-sat · oblique · right · 3.0mm · 0.38mm/px · 6 of 27 slices shown (1 of 3)]
[im 1/27]
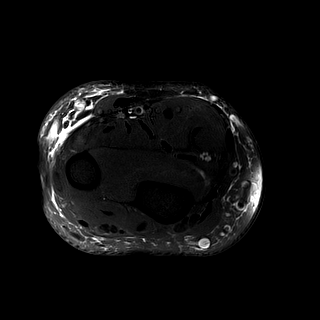
[im 6/27]
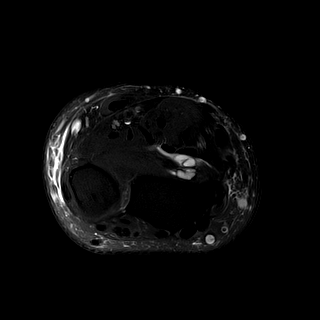
[im 11/27]
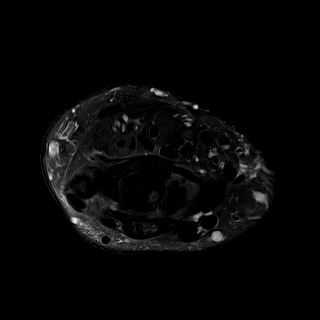
[im 16/27]
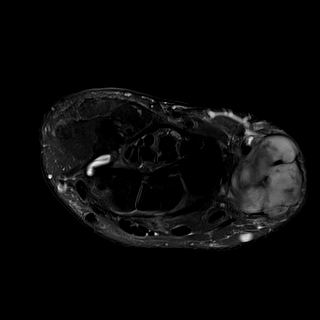
[im 21/27]
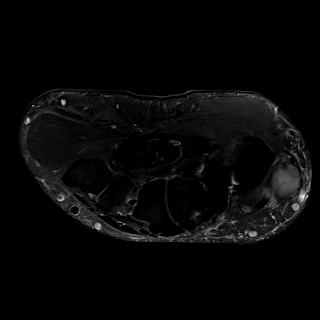
[im 27/27]
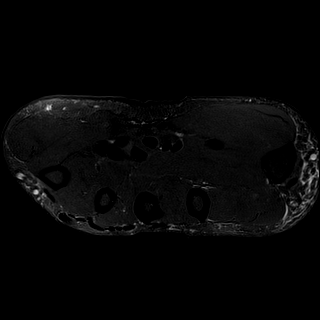

[Series 4: T1 · oblique · right · 3.0mm · 0.38mm/px · 5 of 24 slices shown (1 of 2)]
[im 1/24]
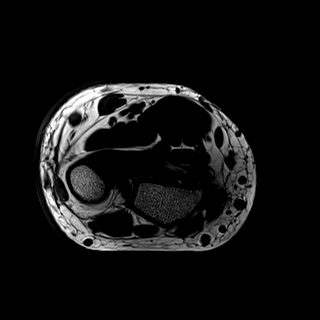
[im 6/24]
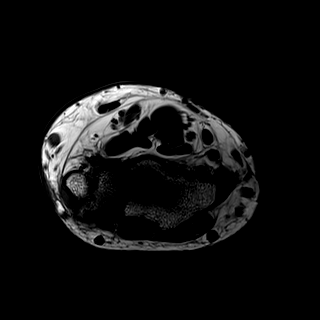
[im 12/24]
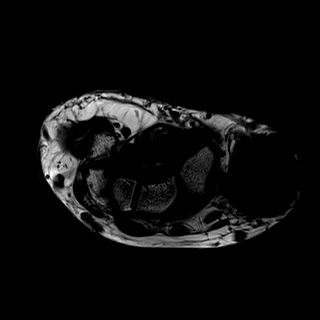
[im 18/24]
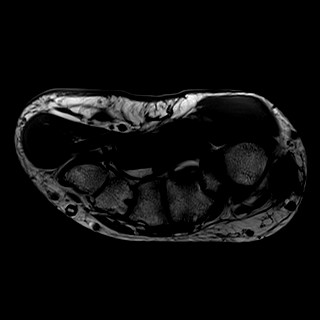
[im 24/24]
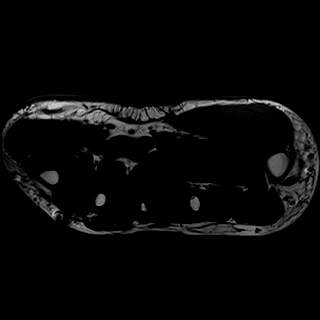

[Series 5: T1 · coronal · right · 3.0mm · 0.39mm/px · 3 of 20 slices shown (2 of 2)]
[im 1/20]
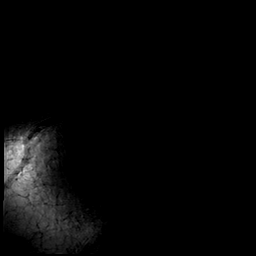
[im 10/20]
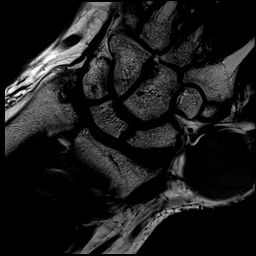
[im 20/20]
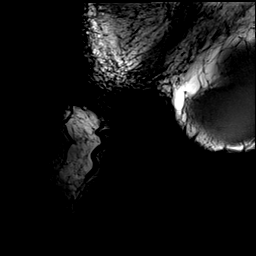

[Series 6: T2 fat-sat · coronal · right · 3.0mm · 0.36mm/px · 3 of 20 slices shown (2 of 3)]
[im 1/20]
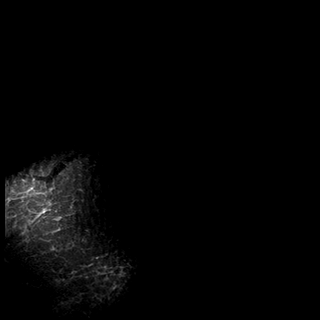
[im 10/20]
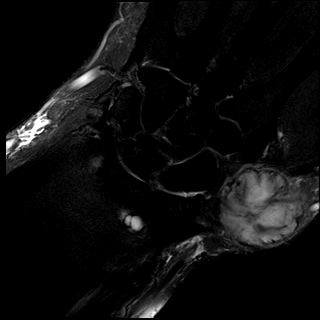
[im 20/20]
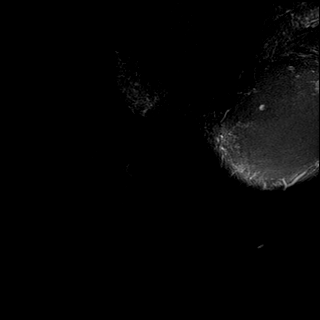

[Series 7: PD fat-sat · coronal · right · 3.0mm · 0.36mm/px · 3 of 20 slices shown (1 of 2)]
[im 1/20]
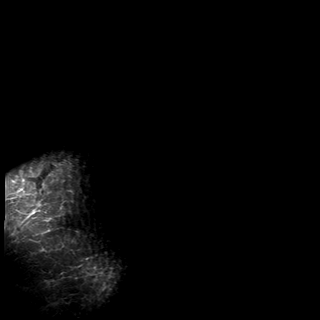
[im 10/20]
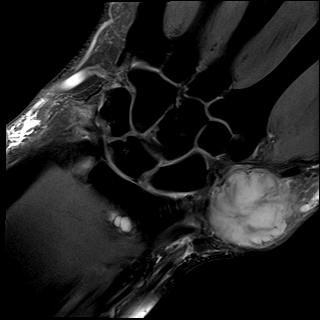
[im 20/20]
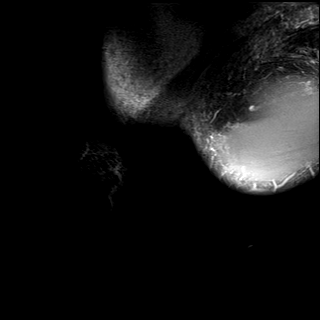

[Series 8: PD fat-sat · oblique · right · 3.0mm · 0.45mm/px · 5 of 32 slices shown (2 of 2)]
[im 1/32]
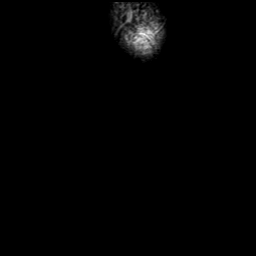
[im 8/32]
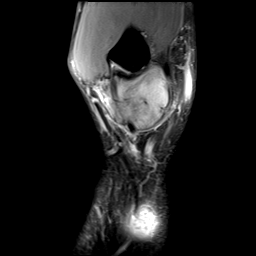
[im 16/32]
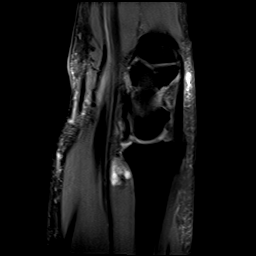
[im 24/32]
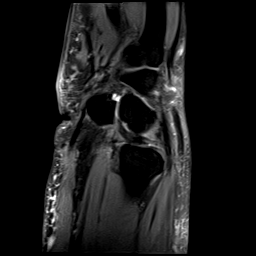
[im 32/32]
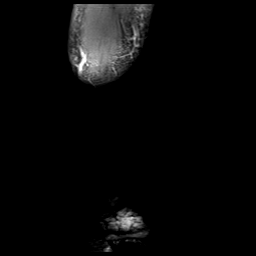

[Series 9: T1 fat-sat · oblique · non-contrast · right · 3.0mm · 0.47mm/px · 4 of 26 slices shown]
[im 1/26]
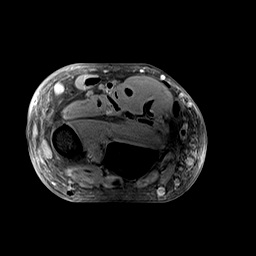
[im 9/26]
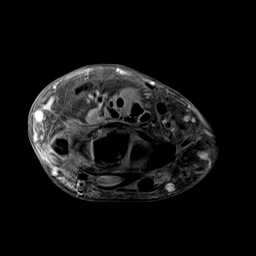
[im 17/26]
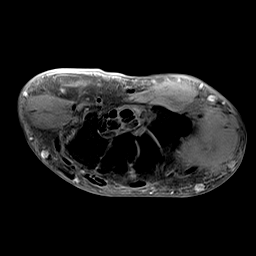
[im 26/26]
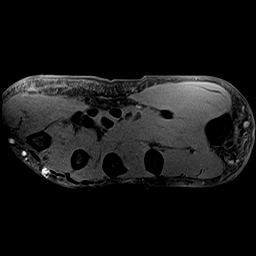

[Series 10: T2 fat-sat · coronal · right · 3.0mm · 0.45mm/px · 3 of 20 slices shown (3 of 3)]
[im 1/20]
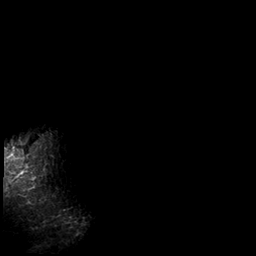
[im 10/20]
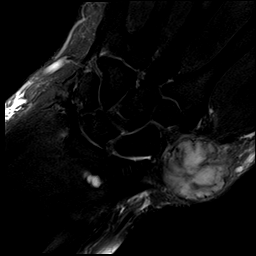
[im 20/20]
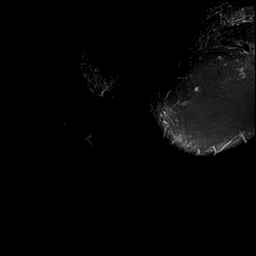

[Series 11: T1 fat-sat post-contrast · oblique · right · 3.0mm · 0.47mm/px · 4 of 26 slices shown (1 of 2)]
[im 1/26]
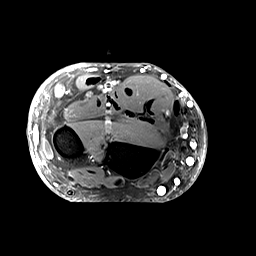
[im 9/26]
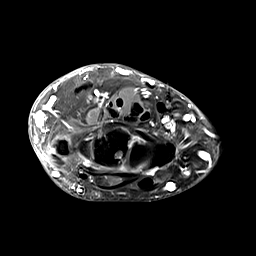
[im 17/26]
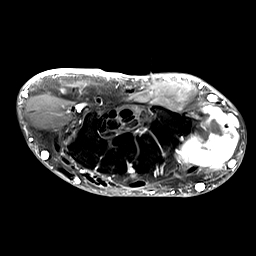
[im 26/26]
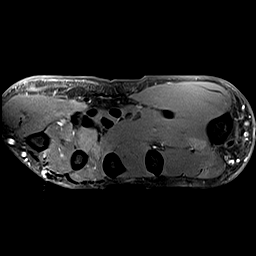

[Series 12: T1 fat-sat post-contrast · coronal · right · 3.0mm · 0.47mm/px · 4 of 21 slices shown (2 of 2)]
[im 1/21]
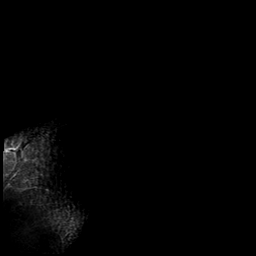
[im 7/21]
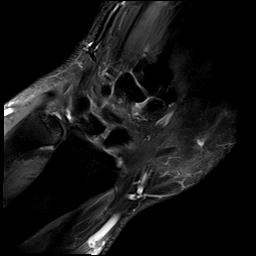
[im 14/21]
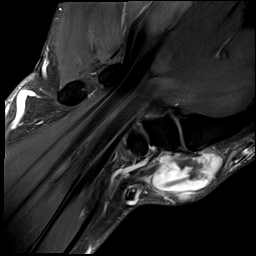
[im 21/21]
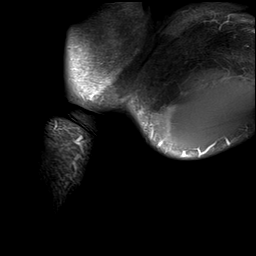

[40 of 40 positions shown; findings below may reference images not displayed]

FINDINGS: Ligaments: Degeneration of the scapholunate ligament without a tear.
Intact lunotriquetral ligament.

Triangular fibrocartilage: Thinning of the TFCC with possible
undersurface tear.

Tendons: Mild tendinosis of the extensor carpi ulnaris. Remainder
the extensor compartment tendons are intact. Flexor compartment
tendons are intact. Lobulated 3.1 x 2.7 x 3.5 cm soft tissue mass
with heterogeneous T2 hyperintensity, T1 intermediate signal and
avid enhancement on postcontrast imaging encircling the extensor
pollicis brevis tendon.

Carpal tunnel/median nerve: Normal carpal tunnel. Normal median
nerve.

Guyon's canal: Normal.

Joint/cartilage: Moderate osteoarthritis of the first CMC joint.
Mild osteoarthritis of the scaphotrapeziotrapezoid joint. No
significant joint effusion.

Bones/carpal alignment: No acute osseous abnormality. No aggressive
osseous lesion. Normal alignment.

Other: 7 x 6 x 7 mm macrolobulated nonenhancing T2 hyperintense mass
along the volar aspect of the distal radial metaphysis deep to the
flexor tendons likely reflecting a ganglion cyst. Muscles are
normal. Soft tissue edema along the ulnar aspect of the wrist.
IMPRESSION: 1. Lobulated 3.1 x 2.7 x 3.5 cm soft tissue mass encircling the
extensor pollicis brevis tendon most concerning for a giant cell
tumor of the tendon sheath.
2. Mild tendinosis of the extensor carpi ulnaris.
3. 7 x 6 x 7 mm ganglion cyst along the volar aspect of the distal
radial metaphysis deep to the flexor tendons.

## 2021-02-22 ENCOUNTER — Encounter: Payer: 59 | Admitting: Internal Medicine

## 2021-12-26 ENCOUNTER — Institutional Professional Consult (permissible substitution): Payer: 59 | Admitting: Pulmonary Disease

## 2022-01-10 ENCOUNTER — Ambulatory Visit (INDEPENDENT_AMBULATORY_CARE_PROVIDER_SITE_OTHER): Payer: 59 | Admitting: Pulmonary Disease

## 2022-01-10 ENCOUNTER — Encounter: Payer: Self-pay | Admitting: Pulmonary Disease

## 2022-01-10 VITALS — BP 164/94 | HR 89 | Temp 97.7°F | Ht 74.0 in | Wt 381.4 lb

## 2022-01-10 DIAGNOSIS — G4733 Obstructive sleep apnea (adult) (pediatric): Secondary | ICD-10-CM | POA: Diagnosis not present

## 2022-01-10 NOTE — Patient Instructions (Signed)
We will schedule you for a split-night sleep study  Your BiPAP pressure requirement may have changed with some of your weight loss  Continue weight loss efforts  Continue to work on quitting smoking  Graded activities as tolerated  Tentative follow-up in 3 to 4 months  Call us with significant concerns

## 2022-01-10 NOTE — Progress Notes (Signed)
Justin Norman    [id number]    1970/02/20  Primary Care Physician:Hernandez Everardo Beals, MD  Referring Physician: Isaac Bliss, Rayford Halsted, MD [place],   [id number]  Chief complaint:   Patient being seen for follow-up of obstructive sleep apnea  HPI:  Diagnosed with obstructive sleep apnea about 8 years ago Severe obstructive sleep apnea with about 106 events an hour Was titrated to BiPAP of 24/18  Has been using BiPAP regularly  Usually goes to bed about 10:30 PM Takes him about 30 minutes to an hour to fall asleep About 4 awakenings Final wake up time by 5 AM  Usually wakes up feeling rested Occasional dry mouth in the mornings No headaches Mom snored  He is an active smoker smokes about 6 to 7 cigarettes a day  Weight has fluctuated over time  He does have high blood pressure A lot of work-related stress lately  Outpatient Encounter Medications as of 01/10/2022  Medication Sig   lisinopril-hydrochlorothiazide (ZESTORETIC) 20-25 MG tablet TAKE 1 TABLET BY MOUTH EVERY DAY   meloxicam (MOBIC) 15 MG tablet Take 1 tablet (15 mg total) by mouth daily.   No facility-administered encounter medications on file as of 01/10/2022.    Allergies as of 01/10/2022 - Review Complete 01/10/2022  Allergen Reaction Noted   Keflex [cephalexin]  03/04/2019    Past Medical History:  Diagnosis Date   Anxiety    during a divorce   Dry skin    ERECTILE DYSFUNCTION 03/03/2007   GERD (gastroesophageal reflux disease)    not taking meds   Headache    headaches   HYPERTENSION 07/03/2007   HYPERTENSION NEC 03/03/2007   Kidney stone    Obesities, morbid ([place])    Sleep apnea 09/2012   does not use Cpap   Tear of medial meniscus of right knee 11/20/2013    Past Surgical History:  Procedure Laterality Date   KNEE ARTHROSCOPY Right 11/20/2013   Procedure: RIGHT KNEE ARTHROSCOPY WITH MEDIAL MENISCECTOMY ;  Surgeon: Johnny Bridge, MD;   Location: [place];  Service: Orthopedics;  Laterality: Right;   MASS EXCISION Right 09/18/2019   Procedure: EXCISION MASS RIGHT WRIST;  Surgeon: Leanora Cover, MD;  Location: [place];  Service: Orthopedics;  Laterality: Right;  GENERAL OR AXILLARY BLOCK   vocal cord abscess surgery      Family History  Problem Relation Age of Onset   Hypertension Mother    Arthritis Mother    Diabetes Mother    Diabetes Sister    Diabetes Sister    Hypertension Sister    Arrhythmia Sister        has pacemaker    Social History   Socioeconomic History   Marital status: Married    Spouse name: Not on file   Number of children: 2   Years of education: Not on file   Highest education level: Not on file  Occupational History    Employer: [employer]  Tobacco Use   Smoking status: Every Day    Packs/day: 0.50    Years: 15.00    Total pack years: 7.50    Types: Cigarettes   Smokeless tobacco: Never  Substance and Sexual Activity   Alcohol use: Yes    Comment: occasional   Drug use: No   Sexual activity: Not on file  Other Topics Concern   Not on file  Social History Narrative   Not on  file   Social Determinants of Health   Financial Resource Strain: Not on file  Food Insecurity: Not on file  Transportation Needs: Not on file  Physical Activity: Not on file  Stress: Not on file  Social Connections: Not on file  Intimate Partner Violence: Not on file    Review of Systems  Respiratory:  Positive for apnea.   Psychiatric/Behavioral:  Positive for sleep disturbance.     Vitals:   01/10/22 1023  BP: (!) 164/94  Pulse: 89  Temp: 97.7 F (36.5 C)  SpO2: 99%     Physical Exam Constitutional:      Appearance: He is obese.  HENT:     Head: Normocephalic.     Mouth/Throat:     Mouth: Mucous membranes are moist.     Comments: Mallampati 3, crowded oropharynx Eyes:     Conjunctiva/sclera: Conjunctivae normal.  Cardiovascular:     Rate and Rhythm: Normal  rate and regular rhythm.     Heart sounds: No murmur heard.    No friction rub.  Pulmonary:     Effort: No respiratory distress.     Breath sounds: No stridor. No wheezing or rhonchi.  Musculoskeletal:     Cervical back: No rigidity or tenderness.  Neurological:     Mental Status: He is alert.  Psychiatric:        Mood and Affect: Mood normal.       01/10/2022   10:00 AM  Results of the Epworth flowsheet  Sitting and reading 2  Watching TV 2  Sitting, inactive in a public place (e.g. a theatre or a meeting) 3  As a passenger in a car for an hour without a break 3  Lying down to rest in the afternoon when circumstances permit 3  Sitting and talking to someone 0  Sitting quietly after a lunch without alcohol 1  In a car, while stopped for a few minutes in traffic 0  Total score 14    Data Reviewed: Home sleep study from 09/15/2012 reviewed showing 106.7 events an hour  Titration study from 2014 shows he was titrated to BiPAP of 24/18  Assessment:  Severe obstructive sleep apnea on high pressures  On BiPAP  Class III obesity  Active smoker  With his weight, there would be a concern for obesity hypoventilation  Plan physiology of sleep disordered breathing was discussed with the patient Treatment options discussed  Plan/Recommendations: Will schedule the patient for a split-night study, he will be titrated to BiPAP  Weight loss efforts encouraged  Smoking cessation efforts encouraged  Tentative follow-up in about 3 to 4 months  Encouraged to call with any significant concerns  Encouraged to continue using current device as tolerated   Sherrilyn Rist MD [facility] 01/10/2022, 10:44 AM  CC: Isaac Bliss, [place]*

## 2022-02-12 ENCOUNTER — Encounter: Payer: Self-pay | Admitting: Internal Medicine

## 2022-04-30 ENCOUNTER — Telehealth: Payer: Self-pay | Admitting: Internal Medicine

## 2022-04-30 DIAGNOSIS — M160 Bilateral primary osteoarthritis of hip: Secondary | ICD-10-CM

## 2022-04-30 NOTE — Telephone Encounter (Signed)
Prescription Request  04/30/2022  LOV: Visit date not found  What is the name of the medication or equipment?  meloxicam (MOBIC) 15 MG tablet   Have you contacted your pharmacy to request a refill? No   Which pharmacy would you like this sent to?  [id number] - [place], [place] [id number] [place] [id number] Phone: [phone] Fax: [phone]    Patient notified that their request is being sent to the clinical staff for review and that they should receive a response within 2 business days.   Please advise at Mobile [phone] (mobile)

## 2022-04-30 NOTE — Telephone Encounter (Signed)
Refill was denied.  Patient needs an office visit.

## 2022-04-30 NOTE — Addendum Note (Signed)
Addended by: Westley Hummer B on: 04/30/2022 02:43 PM   Modules accepted: Orders

## 2022-07-12 ENCOUNTER — Ambulatory Visit (INDEPENDENT_AMBULATORY_CARE_PROVIDER_SITE_OTHER): Payer: 59 | Admitting: Internal Medicine

## 2022-07-12 ENCOUNTER — Encounter: Payer: Self-pay | Admitting: Internal Medicine

## 2022-07-12 VITALS — BP 143/92 | HR 85 | Temp 98.3°F | Wt 389.7 lb

## 2022-07-12 DIAGNOSIS — I10 Essential (primary) hypertension: Secondary | ICD-10-CM | POA: Diagnosis not present

## 2022-07-12 DIAGNOSIS — M160 Bilateral primary osteoarthritis of hip: Secondary | ICD-10-CM

## 2022-07-12 DIAGNOSIS — E782 Mixed hyperlipidemia: Secondary | ICD-10-CM | POA: Diagnosis not present

## 2022-07-12 MED ORDER — MELOXICAM 15 MG PO TABS: 15.0000 mg | ORAL_TABLET | Freq: Every day | ORAL | 1 refills | Status: DC

## 2022-07-12 MED ORDER — LISINOPRIL-HYDROCHLOROTHIAZIDE 20-25 MG PO TABS: 1.0000 | ORAL_TABLET | Freq: Every day | ORAL | 1 refills | Status: DC

## 2022-07-12 NOTE — Assessment & Plan Note (Signed)
Very uncontrolled due to non-adherence. Refill zestoretic. He will do ambulatory BP monitoring and return in 3 months for follow up.

## 2022-07-12 NOTE — Assessment & Plan Note (Signed)
Meloxicam refilled. Weight loss will help.

## 2022-07-12 NOTE — Assessment & Plan Note (Signed)
Discussed healthy lifestyle, including increased physical activity and better food choices to promote weight loss.  

## 2022-07-12 NOTE — Assessment & Plan Note (Signed)
Check lipids when he returns fasting. Not currently on medication.

## 2022-07-12 NOTE — Progress Notes (Signed)
Established Patient Office Visit     CC/Reason for Visit: Elevated blood pressure  HPI: Justin Norman is a 51 y.o. male who is coming in today for the above mentioned reasons. Past Medical History is significant for: Hypertension, hyperlipidemia, morbid obesity, erectile dysfunction, bilateral hip osteoarthritis.  I have not seen him since 2022.  He has been nonadherent to medical therapy.  He is supposed to be on lisinopril/HCTZ for blood pressure but has not been taking it a long time.  He recently applied for a new job and was denied due to hypertension.  This is the reason for his visit today.  He has not been having chest pain, shortness of breath, focal neurologic deficit or vision loss.   Past Medical/Surgical History: Past Medical History:  Diagnosis Date   Anxiety    during a divorce   Dry skin    ERECTILE DYSFUNCTION 03/03/2007   GERD (gastroesophageal reflux disease)    not taking meds   Headache    headaches   HYPERTENSION 07/03/2007   HYPERTENSION NEC 03/03/2007   Kidney stone    Obesities, morbid (HCC)    Sleep apnea 09/2012   does not use Cpap   Tear of medial meniscus of right knee 11/20/2013    Past Surgical History:  Procedure Laterality Date   KNEE ARTHROSCOPY Right 11/20/2013   Procedure: RIGHT KNEE ARTHROSCOPY WITH MEDIAL MENISCECTOMY ;  Surgeon: Eulas Post, MD;  Location: [facility];  Service: Orthopedics;  Laterality: Right;   MASS EXCISION Right 09/18/2019   Procedure: EXCISION MASS RIGHT WRIST;  Surgeon: Betha Loa, MD;  Location: [facility];  Service: Orthopedics;  Laterality: Right;  GENERAL OR AXILLARY BLOCK   vocal cord abscess surgery      Social History:  reports that he has been smoking cigarettes. He has a 7.50 pack-year smoking history. He has never used smokeless tobacco. He reports current alcohol use. He reports that he does not use drugs.  Allergies: Allergies  Allergen Reactions   Keflex [Cephalexin]     Family  History:  Family History  Problem Relation Age of Onset   Hypertension Mother    Arthritis Mother    Diabetes Mother    Diabetes Sister    Diabetes Sister    Hypertension Sister    Arrhythmia Sister        has pacemaker     Current Outpatient Medications:    lisinopril-hydrochlorothiazide (ZESTORETIC) 20-25 MG tablet, Take 1 tablet by mouth daily., Disp: 90 tablet, Rfl: 1   meloxicam (MOBIC) 15 MG tablet, Take 1 tablet (15 mg total) by mouth daily., Disp: 90 tablet, Rfl: 1  Review of Systems:  Negative unless indicated in HPI.   Physical Exam: Vitals:   07/12/22 1019 07/12/22 1024  BP: (!) 160/100 (!) 143/92  Pulse: 85   Temp: 98.3 F (36.8 C)   TempSrc: Oral   SpO2: 97%   Weight: (!) 389 lb 11.2 oz (176.8 kg)     Body mass index is 50.03 kg/m.   Physical Exam Vitals reviewed.  Constitutional:      Appearance: Normal appearance.  HENT:     Head: Normocephalic and atraumatic.  Eyes:     Conjunctiva/sclera: Conjunctivae normal.     Pupils: Pupils are equal, round, and reactive to light.  Cardiovascular:     Rate and Rhythm: Normal rate and regular rhythm.  Pulmonary:     Effort: Pulmonary effort is normal.     Breath  sounds: Normal breath sounds.  Skin:    General: Skin is warm and dry.  Neurological:     General: No focal deficit present.     Mental Status: He is alert and oriented to person, place, and time.  Psychiatric:        Mood and Affect: Mood normal.        Behavior: Behavior normal.        Thought Content: Thought content normal.        Judgment: Judgment normal.      Impression and Plan:  Essential hypertension Assessment & Plan: Very uncontrolled due to non-adherence. Refill zestoretic. He will do ambulatory BP monitoring and return in 3 months for follow up.  Orders: -     Lisinopril-hydroCHLOROthiazide; Take 1 tablet by mouth daily.  Dispense: 90 tablet; Refill: 1  Primary osteoarthritis of both hips Assessment & Plan: Meloxicam  refilled. Weight loss will help.  Orders: -     Meloxicam; Take 1 tablet (15 mg total) by mouth daily.  Dispense: 90 tablet; Refill: 1  Mixed hyperlipidemia Assessment & Plan: Check lipids when he returns fasting. Not currently on medication.   Morbid obesity (HCC) Assessment & Plan: -Discussed healthy lifestyle, including increased physical activity and better food choices to promote weight loss.       Time spent:32 minutes reviewing chart, interviewing and examining patient and formulating plan of care.  Discussed importance of adherence to medical follow-up and medication.     Chaya Jan, MD [facility]

## 2022-07-17 ENCOUNTER — Encounter: Payer: Self-pay | Admitting: Internal Medicine

## 2022-07-17 ENCOUNTER — Telehealth: Payer: Self-pay | Admitting: Internal Medicine

## 2022-07-17 NOTE — Telephone Encounter (Addendum)
Pt is calling and was seen on 07-12-2022 for bp . Pt need on our letterhead a letter that he was seen on 07-12-2022 and that his bp is under control and he is taking bp med lisinopril-hctz. Pt will come pick up letter please call when ready. Attn  James Ivanoff

## 2022-08-30 ENCOUNTER — Encounter: Payer: Self-pay | Admitting: Internal Medicine

## 2022-08-30 ENCOUNTER — Ambulatory Visit (INDEPENDENT_AMBULATORY_CARE_PROVIDER_SITE_OTHER): Payer: Commercial Managed Care - HMO | Admitting: Internal Medicine

## 2022-08-30 VITALS — BP 130/84 | HR 88 | Temp 98.6°F | Wt 378.9 lb

## 2022-08-30 DIAGNOSIS — K921 Melena: Secondary | ICD-10-CM

## 2022-08-30 DIAGNOSIS — E559 Vitamin D deficiency, unspecified: Secondary | ICD-10-CM

## 2022-08-30 DIAGNOSIS — R7302 Impaired glucose tolerance (oral): Secondary | ICD-10-CM

## 2022-08-30 DIAGNOSIS — I10 Essential (primary) hypertension: Secondary | ICD-10-CM

## 2022-08-30 DIAGNOSIS — E782 Mixed hyperlipidemia: Secondary | ICD-10-CM | POA: Diagnosis not present

## 2022-08-30 MED ORDER — HYDROCORTISONE (PERIANAL) 2.5 % EX CREA
1.0000 | TOPICAL_CREAM | Freq: Two times a day (BID) | CUTANEOUS | 0 refills | Status: DC
Start: 2022-08-30 — End: 2022-11-01

## 2022-08-30 MED ORDER — AMLODIPINE BESYLATE 5 MG PO TABS: 5.0000 mg | ORAL_TABLET | Freq: Every day | ORAL | 1 refills | Status: DC

## 2022-08-30 NOTE — Assessment & Plan Note (Signed)
Not well-controlled on lisinopril HCT.  Add amlodipine 5 mg, continue ambulatory blood pressure monitoring and return in 6 to 8 weeks for follow-up.

## 2022-08-30 NOTE — Assessment & Plan Note (Signed)
Discussed healthy lifestyle, including increased physical activity and better food choices to promote weight loss.  

## 2022-08-30 NOTE — Assessment & Plan Note (Signed)
Check lipids today. Not on medication. 

## 2022-08-30 NOTE — Progress Notes (Signed)
Established Patient Office Visit     CC/Reason for Visit: Follow-up chronic conditions  HPI: Justin Norman is a 52 y.o. male who is coming in today for the above mentioned reasons. Past Medical History is significant for: Hypertension, hyperlipidemia, morbid obesity, erectile dysfunction.  Last visit he was asked to resume lisinopril HCT and returns today for follow-up of blood pressure.  Unfortunately his home blood pressure cuff is too small for the size of his arm.  I suspect it has been giving inaccurate measurements.  Blood pressure in office today x 2 was 130/84 and 140/90.  He states he has been adherent to blood pressure medication.  About a week ago had an episode of bright red blood per rectum associated with pain with defecation.  He has never had a colonoscopy.  Episodes of bleeding have continued.   Past Medical/Surgical History: Past Medical History:  Diagnosis Date   Anxiety    during a divorce   Dry skin    ERECTILE DYSFUNCTION 03/03/2007   GERD (gastroesophageal reflux disease)    not taking meds   Headache    headaches   HYPERTENSION 07/03/2007   HYPERTENSION NEC 03/03/2007   Kidney stone    Obesities, morbid (HCC)    Sleep apnea 09/2012   does not use Cpap   Tear of medial meniscus of right knee 11/20/2013    Past Surgical History:  Procedure Laterality Date   KNEE ARTHROSCOPY Right 11/20/2013   Procedure: RIGHT KNEE ARTHROSCOPY WITH MEDIAL MENISCECTOMY ;  Surgeon: Eulas Post, MD;  Location: [facility];  Service: Orthopedics;  Laterality: Right;   MASS EXCISION Right 09/18/2019   Procedure: EXCISION MASS RIGHT WRIST;  Surgeon: Betha Loa, MD;  Location: [facility];  Service: Orthopedics;  Laterality: Right;  GENERAL OR AXILLARY BLOCK   vocal cord abscess surgery      Social History:  reports that he has been smoking cigarettes. He has a 7.5 pack-year smoking history. He has never used smokeless tobacco. He reports current alcohol use. He  reports that he does not use drugs.  Allergies: Allergies  Allergen Reactions   Keflex [Cephalexin]     Family History:  Family History  Problem Relation Age of Onset   Hypertension Mother    Arthritis Mother    Diabetes Mother    Diabetes Sister    Diabetes Sister    Hypertension Sister    Arrhythmia Sister        has pacemaker     Current Outpatient Medications:    amLODipine (NORVASC) 5 MG tablet, Take 1 tablet (5 mg total) by mouth daily., Disp: 90 tablet, Rfl: 1   hydrocortisone (ANUSOL-HC) 2.5 % rectal cream, Place 1 Application rectally 2 (two) times daily., Disp: 30 g, Rfl: 0   lisinopril-hydrochlorothiazide (ZESTORETIC) 20-25 MG tablet, Take 1 tablet by mouth daily., Disp: 90 tablet, Rfl: 1   meloxicam (MOBIC) 15 MG tablet, Take 1 tablet (15 mg total) by mouth daily., Disp: 90 tablet, Rfl: 1  Review of Systems:  Negative unless indicated in HPI.   Physical Exam: Vitals:   08/30/22 1419  BP: 130/84  Pulse: 88  Temp: 98.6 F (37 C)  TempSrc: Oral  SpO2: 95%  Weight: (!) 378 lb 14.4 oz (171.9 kg)    Body mass index is 48.65 kg/m.   Physical Exam Vitals reviewed.  Constitutional:      Appearance: Normal appearance.  HENT:     Head: Normocephalic and atraumatic.  Eyes:     Conjunctiva/sclera: Conjunctivae normal.     Pupils: Pupils are equal, round, and reactive to light.  Cardiovascular:     Rate and Rhythm: Normal rate and regular rhythm.  Pulmonary:     Effort: Pulmonary effort is normal.     Breath sounds: Normal breath sounds.  Skin:    General: Skin is warm and dry.  Neurological:     General: No focal deficit present.     Mental Status: He is alert and oriented to person, place, and time.  Psychiatric:        Mood and Affect: Mood normal.        Behavior: Behavior normal.        Thought Content: Thought content normal.        Judgment: Judgment normal.      Impression and Plan:  Essential hypertension Assessment & Plan: Not  well-controlled on lisinopril HCT.  Add amlodipine 5 mg, continue ambulatory blood pressure monitoring and return in 6 to 8 weeks for follow-up.  Orders: -     Comprehensive metabolic panel; Future -     amLODIPine Besylate; Take 1 tablet (5 mg total) by mouth daily.  Dispense: 90 tablet; Refill: 1  Morbid obesity (HCC) Assessment & Plan: -Discussed healthy lifestyle, including increased physical activity and better food choices to promote weight loss.    Vitamin D deficiency  Mixed hyperlipidemia Assessment & Plan: Check lipids today.  Not on medication.  Orders: -     Lipid panel; Future  Impaired glucose tolerance Assessment & Plan: Check A1c, continue lifestyle changes.  Orders: -     Hemoglobin A1c; Future  Hematochezia -     CBC with Differential/Platelet; Future -     Hydrocortisone (Perianal); Place 1 Application rectally 2 (two) times daily.  Dispense: 30 g; Refill: 0 -     Ambulatory referral to Gastroenterology  -Differential diagnosis could potentially include anal fissure, hemorrhoid, diverticular bleed and possibly even malignancy although less likely given it is painful.  Check blood counts today, will prescribe hydrocortisone cream in case this is a hemorrhoid, referral to GI placed.   Time spent:32 minutes reviewing chart, interviewing and examining patient and formulating plan of care.     Chaya Jan, MD  Primary Care at [facility]

## 2022-08-30 NOTE — Assessment & Plan Note (Signed)
Check A1c, continue lifestyle changes.

## 2022-08-31 LAB — COMPREHENSIVE METABOLIC PANEL
ALT: 27 U/L (ref 0–53)
AST: 20 U/L (ref 0–37)
Albumin: 4.1 g/dL (ref 3.5–5.2)
Alkaline Phosphatase: 36 U/L — ABNORMAL LOW (ref 39–117)
BUN: 20 mg/dL (ref 6–23)
CO2: 29 mEq/L (ref 19–32)
Calcium: 10.1 mg/dL (ref 8.4–10.5)
Chloride: 100 mEq/L (ref 96–112)
Creatinine, Ser: 1.47 mg/dL (ref 0.40–1.50)
GFR: 54.37 mL/min — ABNORMAL LOW (ref >60.00)
Glucose, Bld: 100 mg/dL — ABNORMAL HIGH (ref 70–99)
Potassium: 4.2 mEq/L (ref 3.5–5.1)
Sodium: 136 mEq/L (ref 135–145)
Total Bilirubin: 0.5 mg/dL (ref 0.2–1.2)
Total Protein: 7.7 g/dL (ref 6.0–8.3)

## 2022-08-31 LAB — LIPID PANEL
Cholesterol: 206 mg/dL — ABNORMAL HIGH (ref 0–200)
HDL: 52 mg/dL (ref >39.00)
LDL Cholesterol: 137 mg/dL — ABNORMAL HIGH (ref 0–99)
NonHDL: 154.16
Total CHOL/HDL Ratio: 4
Triglycerides: 85 mg/dL (ref 0.0–149.0)
VLDL: 17 mg/dL (ref 0.0–40.0)

## 2022-08-31 LAB — CBC WITH DIFFERENTIAL/PLATELET
Basophils Absolute: 0.1 10*3/uL (ref 0.0–0.1)
Basophils Relative: 1.1 % (ref 0.0–3.0)
Eosinophils Absolute: 0.2 10*3/uL (ref 0.0–0.7)
Eosinophils Relative: 4 % (ref 0.0–5.0)
HCT: 48.2 % (ref 39.0–52.0)
Hemoglobin: 15.9 g/dL (ref 13.0–17.0)
Lymphocytes Relative: 34.6 % (ref 12.0–46.0)
Lymphs Abs: 2.1 10*3/uL (ref 0.7–4.0)
MCHC: 33 g/dL (ref 30.0–36.0)
MCV: 92.9 fl (ref 78.0–100.0)
Monocytes Absolute: 0.5 10*3/uL (ref 0.1–1.0)
Monocytes Relative: 8.9 % (ref 3.0–12.0)
Neutro Abs: 3.2 10*3/uL (ref 1.4–7.7)
Neutrophils Relative %: 51.4 % (ref 43.0–77.0)
Platelets: 231 10*3/uL (ref 150.0–400.0)
RBC: 5.19 Mil/uL (ref 4.22–5.81)
RDW: 13.8 % (ref 11.5–15.5)
WBC: 6.2 10*3/uL (ref 4.0–10.5)

## 2022-08-31 LAB — HEMOGLOBIN A1C: Hgb A1c MFr Bld: 6.7 % — ABNORMAL HIGH (ref 4.6–6.5)

## 2022-09-06 ENCOUNTER — Other Ambulatory Visit: Payer: Self-pay | Admitting: Internal Medicine

## 2022-09-06 ENCOUNTER — Encounter: Payer: Self-pay | Admitting: Internal Medicine

## 2022-09-06 DIAGNOSIS — E782 Mixed hyperlipidemia: Secondary | ICD-10-CM

## 2022-09-06 DIAGNOSIS — N1832 Chronic kidney disease, stage 3b: Secondary | ICD-10-CM | POA: Insufficient documentation

## 2022-09-06 DIAGNOSIS — E1169 Type 2 diabetes mellitus with other specified complication: Secondary | ICD-10-CM

## 2022-09-06 MED ORDER — METFORMIN HCL 500 MG PO TABS: 500.0000 mg | ORAL_TABLET | Freq: Two times a day (BID) | ORAL | 1 refills | Status: DC

## 2022-09-06 MED ORDER — ATORVASTATIN CALCIUM 40 MG PO TABS: 40.0000 mg | ORAL_TABLET | Freq: Every day | ORAL | 1 refills | Status: DC

## 2022-09-10 ENCOUNTER — Ambulatory Visit (INDEPENDENT_AMBULATORY_CARE_PROVIDER_SITE_OTHER): Payer: Commercial Managed Care - HMO | Admitting: Internal Medicine

## 2022-09-10 VITALS — BP 124/84 | HR 88 | Temp 98.4°F | Wt 396.2 lb

## 2022-09-10 DIAGNOSIS — E782 Mixed hyperlipidemia: Secondary | ICD-10-CM | POA: Diagnosis not present

## 2022-09-10 DIAGNOSIS — E1169 Type 2 diabetes mellitus with other specified complication: Secondary | ICD-10-CM | POA: Diagnosis not present

## 2022-09-10 DIAGNOSIS — Z7984 Long term (current) use of oral hypoglycemic drugs: Secondary | ICD-10-CM

## 2022-09-10 DIAGNOSIS — I10 Essential (primary) hypertension: Secondary | ICD-10-CM | POA: Diagnosis not present

## 2022-09-10 NOTE — Assessment & Plan Note (Signed)
Well controlled on current

## 2022-09-10 NOTE — Assessment & Plan Note (Signed)
Discussed healthy lifestyle, including increased physical activity and better food choices to promote weight loss.  

## 2022-09-10 NOTE — Assessment & Plan Note (Signed)
New diagnosis. continue to work on lifestyle changes.  On metformin.  Recheck A1c in 3 months.

## 2022-09-10 NOTE — Progress Notes (Signed)
Established Patient Office Visit     CC/Reason for Visit: Follow-up chronic conditions  HPI: Justin Norman is a 52 y.o. male who is coming in today for the above mentioned reasons. Past Medical History is significant for: Morbid obesity and hypertension.  He was recently diagnosed with diabetes and hyperlipidemia.  Started on metformin and atorvastatin.  Wanted to come in to discuss with me today.  He has been working on his diet changes.   Past Medical/Surgical History: Past Medical History:  Diagnosis Date   Anxiety    during a divorce   Dry skin    ERECTILE DYSFUNCTION 03/03/2007   GERD (gastroesophageal reflux disease)    not taking meds   Headache    headaches   HYPERTENSION 07/03/2007   HYPERTENSION NEC 03/03/2007   Kidney stone    Obesities, morbid (HCC)    Sleep apnea 09/2012   does not use Cpap   Tear of medial meniscus of right knee 11/20/2013    Past Surgical History:  Procedure Laterality Date   KNEE ARTHROSCOPY Right 11/20/2013   Procedure: RIGHT KNEE ARTHROSCOPY WITH MEDIAL MENISCECTOMY ;  Surgeon: Eulas Post, MD;  Location: [facility];  Service: Orthopedics;  Laterality: Right;   MASS EXCISION Right 09/18/2019   Procedure: EXCISION MASS RIGHT WRIST;  Surgeon: Betha Loa, MD;  Location: [facility];  Service: Orthopedics;  Laterality: Right;  GENERAL OR AXILLARY BLOCK   vocal cord abscess surgery      Social History:  reports that he has been smoking cigarettes. He has a 7.5 pack-year smoking history. He has never used smokeless tobacco. He reports current alcohol use. He reports that he does not use drugs.  Allergies: Allergies  Allergen Reactions   Keflex [Cephalexin]     Family History:  Family History  Problem Relation Age of Onset   Hypertension Mother    Arthritis Mother    Diabetes Mother    Diabetes Sister    Diabetes Sister    Hypertension Sister    Arrhythmia Sister        has pacemaker     Current Outpatient  Medications:    amLODipine (NORVASC) 5 MG tablet, Take 1 tablet (5 mg total) by mouth daily., Disp: 90 tablet, Rfl: 1   atorvastatin (LIPITOR) 40 MG tablet, Take 1 tablet (40 mg total) by mouth daily., Disp: 90 tablet, Rfl: 1   hydrocortisone (ANUSOL-HC) 2.5 % rectal cream, Place 1 Application rectally 2 (two) times daily., Disp: 30 g, Rfl: 0   lisinopril-hydrochlorothiazide (ZESTORETIC) 20-25 MG tablet, Take 1 tablet by mouth daily., Disp: 90 tablet, Rfl: 1   meloxicam (MOBIC) 15 MG tablet, Take 1 tablet (15 mg total) by mouth daily., Disp: 90 tablet, Rfl: 1   metFORMIN (GLUCOPHAGE) 500 MG tablet, Take 1 tablet (500 mg total) by mouth 2 (two) times daily with a meal., Disp: 180 tablet, Rfl: 1  Review of Systems:  Negative unless indicated in HPI.   Physical Exam: Vitals:   09/10/22 1520  BP: 124/84  Pulse: 88  Temp: 98.4 F (36.9 C)  TempSrc: Oral  SpO2: 98%  Weight: (!) 396 lb 3.2 oz (179.7 kg)    Body mass index is 50.87 kg/m.   Physical Exam Vitals reviewed.  Constitutional:      Appearance: Normal appearance.  HENT:     Head: Normocephalic and atraumatic.  Eyes:     Conjunctiva/sclera: Conjunctivae normal.     Pupils: Pupils are equal, round, and  reactive to light.  Cardiovascular:     Rate and Rhythm: Normal rate and regular rhythm.  Pulmonary:     Effort: Pulmonary effort is normal.     Breath sounds: Normal breath sounds.  Skin:    General: Skin is warm and dry.  Neurological:     General: No focal deficit present.     Mental Status: He is alert and oriented to person, place, and time.  Psychiatric:        Mood and Affect: Mood normal.        Behavior: Behavior normal.        Thought Content: Thought content normal.        Judgment: Judgment normal.      Impression and Plan:  Essential hypertension Assessment & Plan: Well controlled on current.   Type 2 diabetes mellitus with other specified complication, without long-term current use of insulin  [facility]) Assessment & Plan: New diagnosis. continue to work on lifestyle changes.  On metformin.  Recheck A1c in 3 months.   Morbid obesity (HCC) Assessment & Plan: -Discussed healthy lifestyle, including increased physical activity and better food choices to promote weight loss.    Mixed hyperlipidemia Assessment & Plan: Newly started on lipitor. Recheck lipids in 3 months.      Time spent:31 minutes reviewing chart, interviewing and examining patient and formulating plan of care.     Chaya Jan, MD [facility]

## 2022-09-10 NOTE — Assessment & Plan Note (Signed)
Newly started on lipitor. Recheck lipids in 3 months.

## 2022-09-11 ENCOUNTER — Encounter: Payer: Self-pay | Admitting: Internal Medicine

## 2022-09-21 ENCOUNTER — Encounter: Payer: Self-pay | Admitting: Adult Health

## 2022-09-21 ENCOUNTER — Ambulatory Visit (INDEPENDENT_AMBULATORY_CARE_PROVIDER_SITE_OTHER): Payer: Commercial Managed Care - HMO | Admitting: Adult Health

## 2022-09-21 VITALS — BP 122/80 | HR 82 | Temp 98.8°F | Ht 74.0 in | Wt 371.0 lb

## 2022-09-21 DIAGNOSIS — E119 Type 2 diabetes mellitus without complications: Secondary | ICD-10-CM

## 2022-09-21 DIAGNOSIS — R5383 Other fatigue: Secondary | ICD-10-CM

## 2022-09-21 DIAGNOSIS — E860 Dehydration: Secondary | ICD-10-CM | POA: Diagnosis not present

## 2022-09-21 DIAGNOSIS — H60501 Unspecified acute noninfective otitis externa, right ear: Secondary | ICD-10-CM

## 2022-09-21 DIAGNOSIS — Z7984 Long term (current) use of oral hypoglycemic drugs: Secondary | ICD-10-CM | POA: Diagnosis not present

## 2022-09-21 LAB — BASIC METABOLIC PANEL
BUN: 22 mg/dL (ref 6–23)
CO2: 27 mEq/L (ref 19–32)
Calcium: 9.9 mg/dL (ref 8.4–10.5)
Chloride: 101 mEq/L (ref 96–112)
Creatinine, Ser: 1.39 mg/dL (ref 0.40–1.50)
GFR: 58.13 mL/min — ABNORMAL LOW (ref >60.00)
Glucose, Bld: 101 mg/dL — ABNORMAL HIGH (ref 70–99)
Potassium: 4.3 mEq/L (ref 3.5–5.1)
Sodium: 135 mEq/L (ref 135–145)

## 2022-09-21 LAB — CBC WITH DIFFERENTIAL/PLATELET
Basophils Absolute: 0 10*3/uL (ref 0.0–0.1)
Basophils Relative: 0.8 % (ref 0.0–3.0)
Eosinophils Absolute: 0.2 10*3/uL (ref 0.0–0.7)
Eosinophils Relative: 2.9 % (ref 0.0–5.0)
HCT: 46 % (ref 39.0–52.0)
Hemoglobin: 15 g/dL (ref 13.0–17.0)
Lymphocytes Relative: 35.8 % (ref 12.0–46.0)
Lymphs Abs: 1.9 10*3/uL (ref 0.7–4.0)
MCHC: 32.6 g/dL (ref 30.0–36.0)
MCV: 92.5 fl (ref 78.0–100.0)
Monocytes Absolute: 0.5 10*3/uL (ref 0.1–1.0)
Monocytes Relative: 9.8 % (ref 3.0–12.0)
Neutro Abs: 2.7 10*3/uL (ref 1.4–7.7)
Neutrophils Relative %: 50.7 % (ref 43.0–77.0)
Platelets: 244 10*3/uL (ref 150.0–400.0)
RBC: 4.98 Mil/uL (ref 4.22–5.81)
RDW: 13.6 % (ref 11.5–15.5)
WBC: 5.4 10*3/uL (ref 4.0–10.5)

## 2022-09-21 MED ORDER — CIPROFLOXACIN-DEXAMETHASONE 0.3-0.1 % OT SUSP: 4.0000 [drp] | Freq: Two times a day (BID) | OTIC | 0 refills | Status: DC

## 2022-09-21 MED ORDER — GLIPIZIDE ER 2.5 MG PO TB24
2.5000 mg | ORAL_TABLET | Freq: Every day | ORAL | 0 refills | Status: DC
Start: 2022-09-21 — End: 2022-11-01

## 2022-09-21 NOTE — Progress Notes (Signed)
Subjective:    Patient ID: Justin Norman, male    DOB: 1970/05/04, 52 y.o.   MRN: [id number]  HPI 52 year old male who  has a past medical history of Anxiety, Dry skin, ERECTILE DYSFUNCTION (03/03/2007), GERD (gastroesophageal reflux disease), Headache, HYPERTENSION (07/03/2007), HYPERTENSION NEC (03/03/2007), Kidney stone, Obesities, morbid (HCC), Sleep apnea (02/13/2011), and Tear of medial meniscus of right knee (11/20/2013).  He is a patient of Dr. Ardyth Harps who I am seeing today for an acute issue   He was last seen by his PCP 11 days ago, about a week prior to that he was diagnosed with diabetes mellitus and started on metformin 500 mg twice daily; as well as lipitor 40 mg and norvasc 5 mg.   He reports that over the last 11 days he has felt fatigued alternating bowel habits with constipation and diarrhea as well as nausea. He has been working on his diet and staying more active to help him lose weight.  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 He has not been drinking a lot of water at work as it is not easily assessable.[name]   He also reports that for the last two weeks he has had right sided ear pain. Pain with touching the ear. No drianage    Review of Systems See HPI   Past Medical History:  Diagnosis Date   Anxiety    during a divorce   Dry skin    ERECTILE DYSFUNCTION 03/03/2007   GERD (gastroesophageal reflux disease)    not taking meds   Headache    headaches   HYPERTENSION 07/03/2007   HYPERTENSION NEC 03/03/2007   Kidney stone    Obesities, morbid (HCC)    Sleep apnea 02/13/2011   Tear of medial meniscus of right knee 11/20/2013    Social History   Socioeconomic History   Marital status: Married    Spouse name: Not on file   Number of  children: 2   Years of education: Not on file   Highest education level: GED or equivalent  Occupational History    Employer: [employer]  Tobacco Use   Smoking status: Every Day    Current packs/day: 0.50    Average packs/day: 0.5 packs/day for 15.0 years (7.5 ttl pk-yrs)    Types: Cigarettes   Smokeless tobacco: Never  Substance and Sexual Activity   Alcohol use: Yes    Comment: occasional   Drug use: No   Sexual activity: Not on file  Other Topics Concern   Not on file  Social History Narrative   Not on file   Social Determinants of Health   Financial Resource Strain: Medium Risk (07/11/2022)   Overall Financial Resource Strain (CARDIA)    Difficulty of Paying Living Expenses: Somewhat hard  Food Insecurity: Food Insecurity Present (07/11/2022)   Hunger Vital Sign    Worried About Running Out of Food in the Last Year: Sometimes true    Ran Out of Food in the Last Year: Sometimes true  Transportation Needs: No Transportation Needs (07/11/2022)   PRAPARE - Administrator, Civil Service (Medical): No    Lack of Transportation (Non-Medical): No  Physical Activity: Unknown (07/11/2022)   Exercise Vital Sign    Days  of Exercise per Week: Patient declined    Minutes of Exercise per Session: Not on file  Stress: Stress Concern Present (07/11/2022)   [employer] of Occupational Health - Occupational Stress Questionnaire    Feeling of Stress : Rather much  Social Connections: Unknown (07/11/2022)   Social Connection and Isolation Panel [NHANES]    Frequency of Communication with Friends and Family: Three times a week    Frequency of Social Gatherings with Friends and Family: Once a week    Attends Religious Services: Patient declined    Active Member of Clubs or Organizations: No    Attends Engineer, structural: Not on file    Marital Status: Married  Catering manager Violence: Not on file    Past Surgical History:  Procedure Laterality Date    KNEE ARTHROSCOPY Right 11/20/2013   Procedure: RIGHT KNEE ARTHROSCOPY WITH MEDIAL MENISCECTOMY ;  Surgeon: Eulas Post, MD;  Location: [facility];  Service: Orthopedics;  Laterality: Right;   MASS EXCISION Right 09/18/2019   Procedure: EXCISION MASS RIGHT WRIST;  Surgeon: Betha Loa, MD;  Location: [facility];  Service: Orthopedics;  Laterality: Right;  GENERAL OR AXILLARY BLOCK   vocal cord abscess surgery      Family History  Problem Relation Age of Onset   Hypertension Mother    Arthritis Mother    Diabetes Sister    Hypertension Sister    Arrhythmia Sister        has pacemaker    Allergies  Allergen Reactions   Keflex [Cephalexin]    Metformin And Related     Diarrhea and nausea     Current Outpatient Medications on File Prior to Visit  Medication Sig Dispense Refill   amLODipine (NORVASC) 5 MG tablet Take 1 tablet (5 mg total) by mouth daily. 90 tablet 1   atorvastatin (LIPITOR) 40 MG tablet Take 1 tablet (40 mg total) by mouth daily. 90 tablet 1   hydrocortisone (ANUSOL-HC) 2.5 % rectal cream Place 1 Application rectally 2 (two) times daily. 30 g 0   lisinopril-hydrochlorothiazide (ZESTORETIC) 20-25 MG tablet Take 1 tablet by mouth daily. 90 tablet 1   meloxicam (MOBIC) 15 MG tablet Take 1 tablet (15 mg total) by mouth daily. 90 tablet 1   No current facility-administered medications on file prior to visit.    BP 122/80   Pulse 82   Temp 98.8 F (37.1 C) (Oral)   Ht 6\' 2"  (1.88 m)   Wt (!) 371 lb (168.3 kg)   SpO2 98%   BMI 47.63 kg/m       Objective:   Physical Exam Vitals and nursing note reviewed.  Constitutional:      Appearance: Normal appearance. He is obese.  HENT:     Right Ear: Swelling and tenderness present. No drainage. No middle ear effusion. There is no impacted cerumen. No mastoid tenderness. Tympanic membrane is not erythematous or bulging.  Cardiovascular:     Rate and Rhythm: Normal rate and regular rhythm.     Pulses:  Normal pulses.     Heart sounds: Normal heart sounds.  Pulmonary:     Effort: Pulmonary effort is normal.     Breath sounds: Normal breath sounds.  Musculoskeletal:        General: Normal range of motion.  Skin:    General: Skin is warm and dry.  Neurological:     General: No focal deficit present.     Mental Status: He is alert and  oriented to person, place, and time.  Psychiatric:        Mood and Affect: Mood normal.        Behavior: Behavior normal.        Thought Content: Thought content normal.        Judgment: Judgment normal.        Assessment & Plan:   1. Diabetes mellitus treated with oral medication (HCC) - Will d/c Metformin and add glipizide 2.5 mg ER daily  - Follow up with PCP as directed  - Basic Metabolic Panel; Future - CBC with Differential/Platelet; Future - glipiZIDE (GLUCOTROL XL) 2.5 MG 24 hr tablet; Take 1 tablet (2.5 mg total) by mouth daily with breakfast.  Dispense: 90 tablet; Refill: 0  2. Other fatigue  - Basic Metabolic Panel; Future - CBC with Differential/Platelet; Future  3. Acute otitis externa of right ear, unspecified type  - ciprofloxacin-dexamethasone (CIPRODEX) OTIC suspension; Place 4 drops into the right ear 2 (two) times daily for 7 days.  Dispense: 7.5 mL; Refill: 0  4. Dehydration - needs to increase flids  - Basic Metabolic Panel; Future - CBC with Differential/Platelet; Future   [employer]

## 2022-09-21 NOTE — Patient Instructions (Signed)
I am going to have you stop metformin and start on glipizide 2.5 mg XR.   I have sent in ciprodex for your ear infection   We will follow up with you regarding your blood work

## 2022-09-27 ENCOUNTER — Encounter (INDEPENDENT_AMBULATORY_CARE_PROVIDER_SITE_OTHER): Payer: Self-pay

## 2022-10-08 ENCOUNTER — Telehealth: Payer: Self-pay | Admitting: Internal Medicine

## 2022-10-08 NOTE — Telephone Encounter (Signed)
Pt is requesting a Transfer of Care - From:   Penne Lash, MD @ [facility] To:       Rito Ehrlich, NP @ [facility] Are both providers okay with this request? Please advise. Respectfully,  Isa C.

## 2022-10-09 ENCOUNTER — Ambulatory Visit: Payer: BC Managed Care – PPO | Admitting: Internal Medicine

## 2022-10-09 NOTE — Telephone Encounter (Signed)
Called Pt to schedule TOC. LVM for Pt to call back to schedule.

## 2022-10-09 NOTE — Telephone Encounter (Signed)
This has been taken care of.

## 2022-10-16 ENCOUNTER — Ambulatory Visit: Payer: 59 | Admitting: Internal Medicine

## 2022-10-17 ENCOUNTER — Ambulatory Visit (INDEPENDENT_AMBULATORY_CARE_PROVIDER_SITE_OTHER): Payer: BC Managed Care – PPO | Admitting: Adult Health

## 2022-10-17 ENCOUNTER — Encounter: Payer: Self-pay | Admitting: Adult Health

## 2022-10-17 ENCOUNTER — Ambulatory Visit: Payer: BC Managed Care – PPO | Admitting: Adult Health

## 2022-10-17 VITALS — BP 140/96 | HR 79 | Temp 98.3°F | Ht 74.0 in | Wt 370.0 lb

## 2022-10-17 DIAGNOSIS — G4733 Obstructive sleep apnea (adult) (pediatric): Secondary | ICD-10-CM

## 2022-10-17 DIAGNOSIS — E782 Mixed hyperlipidemia: Secondary | ICD-10-CM

## 2022-10-17 DIAGNOSIS — Z7689 Persons encountering health services in other specified circumstances: Secondary | ICD-10-CM

## 2022-10-17 DIAGNOSIS — E119 Type 2 diabetes mellitus without complications: Secondary | ICD-10-CM

## 2022-10-17 DIAGNOSIS — I10 Essential (primary) hypertension: Secondary | ICD-10-CM

## 2022-10-17 DIAGNOSIS — Z7984 Long term (current) use of oral hypoglycemic drugs: Secondary | ICD-10-CM

## 2022-10-17 DIAGNOSIS — M5412 Radiculopathy, cervical region: Secondary | ICD-10-CM

## 2022-10-17 DIAGNOSIS — N529 Male erectile dysfunction, unspecified: Secondary | ICD-10-CM

## 2022-10-17 MED ORDER — SILDENAFIL CITRATE 100 MG PO TABS: 100.0000 mg | ORAL_TABLET | Freq: Every day | ORAL | 3 refills | Status: DC | PRN

## 2022-10-17 NOTE — Progress Notes (Unsigned)
Patient presents to clinic today to establish care. He is a 52 year old male who  has a past medical history of Anxiety, Dry skin, ERECTILE DYSFUNCTION (03/03/2007), GERD (gastroesophageal reflux disease), Headache, HYPERTENSION (07/03/2007), HYPERTENSION NEC (03/03/2007), Kidney stone, Obesities, morbid (HCC), Sleep apnea (02/13/2011), and Tear of medial meniscus of right knee (11/20/2013).  Transferring care from Dr. Ardyth Harps  Acute Concerns: Establish Care   Chronic Issues: DM Type 2 - managed with Glipizide 2.5 mg ER. Metformin caused GI issues. He has not been checking his blood sugars at home. He would like to have a glucometer. He does not endorse hypoglycemic episodes.  Lab Results  Component Value Date   HGBA1C 6.7 (H) 08/30/2022   HTN - Managed with Norvasc 5 mg daily and Zestoretic 20-25 mg daily. He denies dizziness, lightheadedness, blurred vision or headaches BP Readings from Last 3 Encounters:  10/17/22 (!) 140/96  09/21/22 122/80  09/10/22 124/84   Hyperlipidemia - managed with Lipitor 40 mg daily.  Lab Results  Component Value Date   CHOL 206 (H) 08/30/2022   HDL 52.00 08/30/2022   LDLCALC 137 (H) 08/30/2022   LDLDIRECT 123.9 06/19/2011   TRIG 85.0 08/30/2022   CHOLHDL 4 08/30/2022   Tobacco Use - 7.5 pack year smoking history. Currently smoking.   Class 3 obesity - he has been eating healthier and exercising more since learning of the diabetes diagnosis  Wt Readings from Last 3 Encounters:  10/17/22 (!) 370 lb (167.8 kg)  09/21/22 (!) 371 lb (168.3 kg)  09/10/22 (!) 396 lb 3.2 oz (179.7 kg)   Sleep Apnea - wears CPAP every night. He was last seen by Pulmonary in 12/2021   ED- has had a prescription for Viagra in the past. Continues to have issues with ED  Numbness - he reports that over the last week he has developed a constant numbness in all of his fingers tips bilaterally. He does not have any decreased grip strength. Denies trauma but does sleep  on multiple pillows.    Health Maintenance: Dental -- Does do routine care  Vision -- Does not do routine care  Immunizations -- UTD  Colonoscopy -- Has been referred ut not scheduled yet     Past Medical History:  Diagnosis Date   Anxiety    during a divorce   Dry skin    ERECTILE DYSFUNCTION 03/03/2007   GERD (gastroesophageal reflux disease)    not taking meds   Headache    headaches   HYPERTENSION 07/03/2007   HYPERTENSION NEC 03/03/2007   Kidney stone    Obesities, morbid (HCC)    Sleep apnea 02/13/2011   Tear of medial meniscus of right knee 11/20/2013    Past Surgical History:  Procedure Laterality Date   KNEE ARTHROSCOPY Right 11/20/2013   Procedure: RIGHT KNEE ARTHROSCOPY WITH MEDIAL MENISCECTOMY ;  Surgeon: Eulas Post, MD;  Location: [facility] OR;  Service: Orthopedics;  Laterality: Right;   MASS EXCISION Right 09/18/2019   Procedure: EXCISION MASS RIGHT WRIST;  Surgeon: Betha Loa, MD;  Location: [facility];  Service: Orthopedics;  Laterality: Right;  GENERAL OR AXILLARY BLOCK   vocal cord abscess surgery      Current Outpatient Medications on File Prior to Visit  Medication Sig Dispense Refill   amLODipine (NORVASC) 5 MG tablet Take 1 tablet (5 mg total) by mouth daily. 90 tablet 1   atorvastatin (LIPITOR) 40 MG tablet Take 1 tablet (40 mg total) by mouth daily.  90 tablet 1   glipiZIDE (GLUCOTROL XL) 2.5 MG 24 hr tablet Take 1 tablet (2.5 mg total) by mouth daily with breakfast. 90 tablet 0   hydrocortisone (ANUSOL-HC) 2.5 % rectal cream Place 1 Application rectally 2 (two) times daily. 30 g 0   lisinopril-hydrochlorothiazide (ZESTORETIC) 20-25 MG tablet Take 1 tablet by mouth daily. 90 tablet 1   meloxicam (MOBIC) 15 MG tablet Take 1 tablet (15 mg total) by mouth daily. 90 tablet 1   No current facility-administered medications on file prior to visit.    Allergies  Allergen Reactions   Keflex [Cephalexin]    Metformin And Related      Diarrhea and nausea     Family History  Problem Relation Age of Onset   Hypertension Mother    Arthritis Mother    Dementia Mother    Hypertension Sister    Arrhythmia Sister        Visual merchandiser   Diabetes Sister    Kidney disease Sister    Scoliosis Daughter    Stroke Maternal Grandmother    Diabetes Maternal Grandfather     Social History   Socioeconomic History   Marital status: Married    Spouse name: Not on file   Number of children: 2   Years of education: Not on file   Highest education level: GED or equivalent  Occupational History    Employer: [employer]  Tobacco Use   Smoking status: Every Day    Current packs/day: 0.50    Average packs/day: 0.5 packs/day for 15.0 years (7.5 ttl pk-yrs)    Types: Cigarettes   Smokeless tobacco: Never  Substance and Sexual Activity   Alcohol use: Yes    Comment: occasional   Drug use: No   Sexual activity: Not on file  Other Topics Concern   Not on file  Social History Narrative   Not on file   Social Determinants of Health   Financial Resource Strain: Medium Risk (07/11/2022)   Overall Financial Resource Strain (CARDIA)    Difficulty of Paying Living Expenses: Somewhat hard  Food Insecurity: Food Insecurity Present (07/11/2022)   Hunger Vital Sign    Worried About Running Out of Food in the Last Year: Sometimes true    Ran Out of Food in the Last Year: Sometimes true  Transportation Needs: No Transportation Needs (07/11/2022)   PRAPARE - Administrator, Civil Service (Medical): No    Lack of Transportation (Non-Medical): No  Physical Activity: Unknown (07/11/2022)   Exercise Vital Sign    Days of Exercise per Week: Patient declined    Minutes of Exercise per Session: Not on file  Stress: Stress Concern Present (07/11/2022)   [employer] of Occupational Health - Occupational Stress Questionnaire    Feeling of Stress : Rather much  Social Connections: Unknown (07/11/2022)   Social Connection and  Isolation Panel [NHANES]    Frequency of Communication with Friends and Family: Three times a week    Frequency of Social Gatherings with Friends and Family: Once a week    Attends Religious Services: Patient declined    Database administrator or Organizations: No    Attends Engineer, structural: Not on file    Marital Status: Married  Catering manager Violence: Not on file    Review of Systems  Constitutional: Negative.   HENT: Negative.    Eyes: Negative.   Respiratory: Negative.    Cardiovascular: Negative.   Gastrointestinal: Negative.   Genitourinary:  Negative.   Musculoskeletal: Negative.   Skin: Negative.   Neurological: Negative.   Endo/Heme/Allergies: Negative.   Psychiatric/Behavioral: Negative.      BP (!) 140/96   Pulse 79   Temp 98.3 F (36.8 C) (Oral)   Ht 6\' 2"  (1.88 m)   Wt (!) 370 lb (167.8 kg)   SpO2 96%   BMI 47.51 kg/m   Physical Exam Vitals and nursing note reviewed.  Constitutional:      Appearance: Normal appearance. He is obese.  Cardiovascular:     Rate and Rhythm: Normal rate and regular rhythm.     Pulses: Normal pulses.     Heart sounds: Normal heart sounds.  Pulmonary:     Effort: Pulmonary effort is normal.  Musculoskeletal:        General: No swelling or tenderness. Normal range of motion.  Skin:    General: Skin is warm and dry.     Capillary Refill: Capillary refill takes less than 2 seconds.  Neurological:     General: No focal deficit present.     Mental Status: He is alert and oriented to person, place, and time.     Sensory: No sensory deficit.     Motor: No weakness.  Psychiatric:        Mood and Affect: Mood normal.        Behavior: Behavior normal.        Thought Content: Thought content normal.        Judgment: Judgment normal.     Recent Results (from the past 2160 hour(s))  Lipid panel     Status: Abnormal   Collection Time: 08/30/22  2:55 PM  Result Value Ref Range   Cholesterol 206 (H) 0 - 200  mg/dL    Comment: ATP III Classification       Desirable:  < 200 mg/dL               Borderline High:  200 - 239 mg/dL          High:  > = 161 mg/dL   Triglycerides 09.6 0.0 - 149.0 mg/dL    Comment: Normal:  <045 mg/dLBorderline High:  150 - 199 mg/dL   HDL 40.98 >11.91 mg/dL   VLDL 47.8 0.0 - 29.5 mg/dL   LDL Cholesterol 621 (H) 0 - 99 mg/dL   Total CHOL/HDL Ratio 4     Comment:                Men          Women1/2 Average Risk     3.4          3.3Average Risk          5.0          4.42X Average Risk          9.6          7.13X Average Risk          15.0          11.0                       NonHDL 154.16     Comment: NOTE:  Non-HDL goal should be 30 mg/dL higher than patient's LDL goal (i.e. LDL goal of < 70 mg/dL, would have non-HDL goal of < 100 mg/dL)  Hemoglobin H0Q     Status: Abnormal   Collection Time: 08/30/22  2:55 PM  Result Value Ref Range  Hgb A1c MFr Bld 6.7 (H) 4.6 - 6.5 %    Comment: Glycemic Control Guidelines for People with Diabetes:Non Diabetic:  <6%Goal of Therapy: <7%Additional Action Suggested:  >8%   Comprehensive metabolic panel     Status: Abnormal   Collection Time: 08/30/22  2:55 PM  Result Value Ref Range   Sodium 136 135 - 145 mEq/L   Potassium 4.2 3.5 - 5.1 mEq/L   Chloride 100 96 - 112 mEq/L   CO2 29 19 - 32 mEq/L   Glucose, Bld 100 (H) 70 - 99 mg/dL   BUN 20 6 - 23 mg/dL   Creatinine, Ser 1.61 0.40 - 1.50 mg/dL   Total Bilirubin 0.5 0.2 - 1.2 mg/dL   Alkaline Phosphatase 36 (L) 39 - 117 U/L   AST 20 0 - 37 U/L   ALT 27 0 - 53 U/L   Total Protein 7.7 6.0 - 8.3 g/dL   Albumin 4.1 3.5 - 5.2 g/dL   GFR 09.60 (L) >45.40 mL/min    Comment: Calculated using the CKD-EPI Creatinine Equation (2021)   Calcium 10.1 8.4 - 10.5 mg/dL  CBC with Differential/Platelet     Status: None   Collection Time: 08/30/22  2:55 PM  Result Value Ref Range   WBC 6.2 4.0 - 10.5 K/uL   RBC 5.19 4.22 - 5.81 Mil/uL   Hemoglobin 15.9 13.0 - 17.0 g/dL   HCT 98.1 19.1 - 47.8 %    MCV 92.9 78.0 - 100.0 fl   MCHC 33.0 30.0 - 36.0 g/dL   RDW 29.5 62.1 - 30.8 %   Platelets 231.0 150.0 - 400.0 K/uL   Neutrophils Relative % 51.4 43.0 - 77.0 %   Lymphocytes Relative 34.6 12.0 - 46.0 %   Monocytes Relative 8.9 3.0 - 12.0 %   Eosinophils Relative 4.0 0.0 - 5.0 %   Basophils Relative 1.1 0.0 - 3.0 %   Neutro Abs 3.2 1.4 - 7.7 K/uL   Lymphs Abs 2.1 0.7 - 4.0 K/uL   Monocytes Absolute 0.5 0.1 - 1.0 K/uL   Eosinophils Absolute 0.2 0.0 - 0.7 K/uL   Basophils Absolute 0.1 0.0 - 0.1 K/uL  CBC with Differential/Platelet     Status: None   Collection Time: 09/21/22  1:38 PM  Result Value Ref Range   WBC 5.4 4.0 - 10.5 K/uL   RBC 4.98 4.22 - 5.81 Mil/uL   Hemoglobin 15.0 13.0 - 17.0 g/dL   HCT 65.7 84.6 - 96.2 %   MCV 92.5 78.0 - 100.0 fl   MCHC 32.6 30.0 - 36.0 g/dL   RDW 95.2 84.1 - 32.4 %   Platelets 244.0 150.0 - 400.0 K/uL   Neutrophils Relative % 50.7 43.0 - 77.0 %   Lymphocytes Relative 35.8 12.0 - 46.0 %   Monocytes Relative 9.8 3.0 - 12.0 %   Eosinophils Relative 2.9 0.0 - 5.0 %   Basophils Relative 0.8 0.0 - 3.0 %   Neutro Abs 2.7 1.4 - 7.7 K/uL   Lymphs Abs 1.9 0.7 - 4.0 K/uL   Monocytes Absolute 0.5 0.1 - 1.0 K/uL   Eosinophils Absolute 0.2 0.0 - 0.7 K/uL   Basophils Absolute 0.0 0.0 - 0.1 K/uL  Basic Metabolic Panel     Status: Abnormal   Collection Time: 09/21/22  1:38 PM  Result Value Ref Range   Sodium 135 135 - 145 mEq/L   Potassium 4.3 3.5 - 5.1 mEq/L   Chloride 101 96 - 112 mEq/L   CO2 27  19 - 32 mEq/L   Glucose, Bld 101 (H) 70 - 99 mg/dL   BUN 22 6 - 23 mg/dL   Creatinine, Ser 1.61 0.40 - 1.50 mg/dL   GFR 09.60 (L) >45.40 mL/min    Comment: Calculated using the CKD-EPI Creatinine Equation (2021)   Calcium 9.9 8.4 - 10.5 mg/dL    Assessment/Plan: .1. Encounter to establish care Today patient counseled on age appropriate routine health concerns for screening and prevention, each reviewed and up to date or declined. Immunizations reviewed  and up to date or declined. Labs ordered and reviewed. Risk factors for depression reviewed and negative. Hearing function and visual acuity are intact. ADLs screened and addressed as needed. Functional ability and level of safety reviewed and appropriate. Education, counseling and referrals performed based on assessed risks today. Patient provided with a copy of personalized plan for preventive services.   2. Diabetes mellitus treated with oral medication (HCC) - Follow up next month for A1c check. Consider adding Ozempic or Moujaro  - Blood Glucose Monitoring Suppl DEVI; 1 each by Does not apply route in the morning, at noon, and at bedtime. May substitute to any manufacturer covered by patient's insurance.  Dispense: 1 each; Refill: 0 - Glucose Blood (BLOOD GLUCOSE TEST STRIPS) STRP; 1 each by In Vitro route in the morning, at noon, and at bedtime. May substitute to any manufacturer covered by patient's insurance.  Dispense: 300 strip; Refill: 6 - Lancets Misc. MISC; 1 each by Does not apply route in the morning, at noon, and at bedtime. May substitute to any manufacturer covered by patient's insurance.  Dispense: 300 each; Refill: 3  3. Essential hypertension - Well controlled. No change in medication   4. Morbid obesity (HCC) - Continue to work on weight loss measures   5. Mixed hyperlipidemia - Continue statin   6. OSA (obstructive sleep apnea) - Wear CPAP and follow up with Pulmonary as directed  7. Erectile dysfunction, unspecified erectile dysfunction type  - sildenafil (VIAGRA) 100 MG tablet; Take 1 tablet (100 mg total) by mouth daily as needed for erectile dysfunction.  Dispense: 10 tablet; Refill: 3  8. Cervical radiculopathy - Will start with cervical spine xray  - Consider MRI - Sleep on one pillow - Stretching exercises with exercise band at home - DG Cervical Spine Complete; Future   Shirline Frees, NP

## 2022-10-18 MED ORDER — BLOOD GLUCOSE MONITORING SUPPL DEVI: 1.0000 | Freq: Three times a day (TID) | 0 refills | Status: DC

## 2022-10-18 MED ORDER — BLOOD GLUCOSE TEST VI STRP
1.0000 | ORAL_STRIP | Freq: Three times a day (TID) | 6 refills | Status: AC
Start: 2022-10-18 — End: 2022-11-17

## 2022-10-18 MED ORDER — LANCETS MISC. MISC
1.0000 | Freq: Three times a day (TID) | 3 refills | Status: AC
Start: 2022-10-18 — End: 2022-11-17

## 2022-10-22 ENCOUNTER — Other Ambulatory Visit: Payer: BC Managed Care – PPO

## 2022-10-25 ENCOUNTER — Other Ambulatory Visit: Payer: BC Managed Care – PPO

## 2022-10-25 ENCOUNTER — Ambulatory Visit (INDEPENDENT_AMBULATORY_CARE_PROVIDER_SITE_OTHER): Payer: Commercial Managed Care - HMO

## 2022-10-25 DIAGNOSIS — M5412 Radiculopathy, cervical region: Secondary | ICD-10-CM | POA: Diagnosis not present

## 2022-10-25 DIAGNOSIS — M4722 Other spondylosis with radiculopathy, cervical region: Secondary | ICD-10-CM | POA: Diagnosis not present

## 2022-10-31 ENCOUNTER — Other Ambulatory Visit: Payer: Self-pay | Admitting: Adult Health

## 2022-10-31 DIAGNOSIS — E119 Type 2 diabetes mellitus without complications: Secondary | ICD-10-CM

## 2022-10-31 DIAGNOSIS — H60501 Unspecified acute noninfective otitis externa, right ear: Secondary | ICD-10-CM

## 2022-11-01 ENCOUNTER — Other Ambulatory Visit: Payer: Self-pay | Admitting: Internal Medicine

## 2022-11-01 DIAGNOSIS — K921 Melena: Secondary | ICD-10-CM

## 2022-11-07 ENCOUNTER — Telehealth: Payer: Self-pay | Admitting: Adult Health

## 2022-11-07 NOTE — Telephone Encounter (Signed)
Pt returned call from [facility]

## 2022-11-08 NOTE — Telephone Encounter (Signed)
Pt returned call again

## 2022-11-09 NOTE — Telephone Encounter (Signed)
Left message to return phone call.

## 2022-11-09 NOTE — Telephone Encounter (Signed)
Patient notified of update  and verbalized understanding. 

## 2022-11-16 ENCOUNTER — Other Ambulatory Visit: Payer: Self-pay | Admitting: Adult Health

## 2022-11-16 ENCOUNTER — Encounter: Payer: Self-pay | Admitting: Adult Health

## 2022-11-16 ENCOUNTER — Ambulatory Visit: Payer: Managed Care, Other (non HMO)

## 2022-11-16 ENCOUNTER — Ambulatory Visit: Payer: Managed Care, Other (non HMO) | Admitting: Adult Health

## 2022-11-16 ENCOUNTER — Ambulatory Visit (INDEPENDENT_AMBULATORY_CARE_PROVIDER_SITE_OTHER): Payer: Managed Care, Other (non HMO) | Admitting: Adult Health

## 2022-11-16 VITALS — BP 160/90 | HR 73 | Temp 98.1°F | Wt 367.0 lb

## 2022-11-16 DIAGNOSIS — E119 Type 2 diabetes mellitus without complications: Secondary | ICD-10-CM | POA: Diagnosis not present

## 2022-11-16 DIAGNOSIS — I10 Essential (primary) hypertension: Secondary | ICD-10-CM | POA: Diagnosis not present

## 2022-11-16 DIAGNOSIS — Z1211 Encounter for screening for malignant neoplasm of colon: Secondary | ICD-10-CM

## 2022-11-16 DIAGNOSIS — G8929 Other chronic pain: Secondary | ICD-10-CM | POA: Diagnosis not present

## 2022-11-16 DIAGNOSIS — M25561 Pain in right knee: Secondary | ICD-10-CM

## 2022-11-16 DIAGNOSIS — Z1212 Encounter for screening for malignant neoplasm of rectum: Secondary | ICD-10-CM

## 2022-11-16 DIAGNOSIS — Z7985 Long-term (current) use of injectable non-insulin antidiabetic drugs: Secondary | ICD-10-CM | POA: Diagnosis not present

## 2022-11-16 DIAGNOSIS — Z6841 Body Mass Index (BMI) 40.0 and over, adult: Secondary | ICD-10-CM

## 2022-11-16 LAB — POCT GLYCOSYLATED HEMOGLOBIN (HGB A1C): Hemoglobin A1C: 5.6 % (ref 4.0–5.6)

## 2022-11-16 MED ORDER — TIRZEPATIDE 5 MG/0.5ML ~~LOC~~ SOAJ
5.0000 mg | SUBCUTANEOUS | 0 refills | Status: DC
Start: 2022-11-16 — End: 2022-12-06

## 2022-11-16 NOTE — Progress Notes (Signed)
Subjective:    Patient ID: Justin Norman, male    DOB: 11-14-1970, 52 y.o.   MRN: [id number]  HPI  52 year old male who  has a past medical history of Anxiety, Dry skin, ERECTILE DYSFUNCTION (03/03/2007), GERD (gastroesophageal reflux disease), Headache, HYPERTENSION (07/03/2007), Kidney stone, Obesities, morbid (HCC), Sleep apnea (02/13/2011), and Tear of medial meniscus of right knee (11/20/2013).  He presents to the office today for follow up regarding DM/HTN/Obesity   DM Type 2 - managed with Glipizide 2.5 mg ER. Metformin caused GI issues. He has not been checking his blood sugars at home. He would like to have a glucometer. He does not endorse hypoglycemic episodes.  Lab Results  Component Value Date   HGBA1C 5.6 11/16/2022   HGBA1C 6.7 (H) 08/30/2022   HGBA1C 6.0 07/15/2018   HTN - Managed with Norvasc 5 mg daily and Zestoretic 20-25 mg daily. He denies dizziness, lightheadedness, blurred vision or headaches. He has been out of Lisinopril/hydrochlorothiazide - has a refill at the pharmacy but has not picked it up yet.  BP Readings from Last 3 Encounters:  11/16/22 (!) 160/90  10/17/22 (!) 140/96  09/21/22 122/80   Obesity - he has been working on diet through exercise and eating healthier Wt Readings from Last 10 Encounters:  11/16/22 (!) 367 lb (166.5 kg)  10/17/22 (!) 370 lb (167.8 kg)  09/21/22 (!) 371 lb (168.3 kg)  09/10/22 (!) 396 lb 3.2 oz (179.7 kg)  08/30/22 (!) 378 lb 14.4 oz (171.9 kg)  07/12/22 (!) 389 lb 11.2 oz (176.8 kg)  01/10/22 (!) 381 lb 6.4 oz (173 kg)  12/30/20 (!) 378 lb 11.2 oz (171.8 kg)  05/13/20 (!) 379 lb 8 oz (172.1 kg)  09/18/19 (!) 374 lb 5.5 oz (169.8 kg)   Right knee pain - has had knee pain x 2 years intermittently. Feels as though the knee swells from time to time. Worse at night when he is laying down but will have pain with walking. He takes Mobic which helps    Colon cancer screening - he is wanting to schedule his  colonoscopy   Review of Systems See HPI   Past Medical History:  Diagnosis Date   Anxiety    during a divorce   Dry skin    ERECTILE DYSFUNCTION 03/03/2007   GERD (gastroesophageal reflux disease)    not taking meds   Headache    headaches   HYPERTENSION 07/03/2007   Kidney stone    Obesities, morbid (HCC)    Sleep apnea 02/13/2011   Tear of medial meniscus of right knee 11/20/2013    Social History   Socioeconomic History   Marital status: Married    Spouse name: Not on file   Number of children: 2   Years of education: Not on file   Highest education level: GED or equivalent  Occupational History    Employer: [employer]  Tobacco Use   Smoking status: Every Day    Current packs/day: 0.50    Average packs/day: 0.5 packs/day for 15.0 years (7.5 ttl pk-yrs)    Types: Cigarettes   Smokeless tobacco: Never  Substance and Sexual Activity   Alcohol use: Yes    Comment: occasional   Drug use: No   Sexual activity: Not on file  Other Topics Concern   Not on file  Social History Narrative   Not on file   Social Determinants of Health   Financial Resource Strain: Medium Risk (07/11/2022)   Overall  Financial Resource Strain (CARDIA)    Difficulty of Paying Living Expenses: Somewhat hard  Food Insecurity: Food Insecurity Present (07/11/2022)   Hunger Vital Sign    Worried About Running Out of Food in the Last Year: Sometimes true    Ran Out of Food in the Last Year: Sometimes true  Transportation Needs: No Transportation Needs (07/11/2022)   PRAPARE - Administrator, Civil Service (Medical): No    Lack of Transportation (Non-Medical): No  Physical Activity: Unknown (07/11/2022)   Exercise Vital Sign    Days of Exercise per Week: Patient declined    Minutes of Exercise per Session: Not on file  Stress: Stress Concern Present (07/11/2022)   [employer] of Occupational Health - Occupational Stress Questionnaire    Feeling of Stress : Rather much   Social Connections: Unknown (07/11/2022)   Social Connection and Isolation Panel [NHANES]    Frequency of Communication with Friends and Family: Three times a week    Frequency of Social Gatherings with Friends and Family: Once a week    Attends Religious Services: Patient declined    Active Member of Clubs or Organizations: No    Attends Engineer, structural: Not on file    Marital Status: Married  Catering manager Violence: Not on file    Past Surgical History:  Procedure Laterality Date   KNEE ARTHROSCOPY Right 11/20/2013   Procedure: RIGHT KNEE ARTHROSCOPY WITH MEDIAL MENISCECTOMY ;  Surgeon: Eulas Post, MD;  Location: [facility] OR;  Service: Orthopedics;  Laterality: Right;   MASS EXCISION Right 09/18/2019   Procedure: EXCISION MASS RIGHT WRIST;  Surgeon: Betha Loa, MD;  Location: [facility];  Service: Orthopedics;  Laterality: Right;  GENERAL OR AXILLARY BLOCK   vocal cord abscess surgery      Family History  Problem Relation Age of Onset   Hypertension Mother    Arthritis Mother    Dementia Mother    Hypertension Sister    Arrhythmia Sister        pace maker   Diabetes Sister    Kidney disease Sister    Scoliosis Daughter    Stroke Maternal Grandmother    Diabetes Maternal Grandfather     Allergies  Allergen Reactions   Keflex [Cephalexin]    Metformin And Related     Diarrhea and nausea     Current Outpatient Medications on File Prior to Visit  Medication Sig Dispense Refill   amLODipine (NORVASC) 5 MG tablet Take 1 tablet (5 mg total) by mouth daily. 90 tablet 1   atorvastatin (LIPITOR) 40 MG tablet Take 1 tablet (40 mg total) by mouth daily. 90 tablet 1   Blood Glucose Monitoring Suppl DEVI 1 each by Does not apply route in the morning, at noon, and at bedtime. May substitute to any manufacturer covered by patient's insurance. 1 each 0   ciprofloxacin-dexamethasone (CIPRODEX) OTIC suspension INSTILL 4 DROPS INTO RIGHT EAR TWICE A DAY FOR  7 DAYS 7.5 mL 0   glipiZIDE (GLUCOTROL XL) 2.5 MG 24 hr tablet TAKE 1 TABLET BY MOUTH DAILY WITH BREAKFAST. 90 tablet 0   Glucose Blood (BLOOD GLUCOSE TEST STRIPS) STRP 1 each by In Vitro route in the morning, at noon, and at bedtime. May substitute to any manufacturer covered by patient's insurance. 300 strip 6   hydrocortisone (ANUSOL-HC) 2.5 % rectal cream Place 1 Application rectally 2 (two) times daily. 30 g 0   Lancets Misc. MISC 1 each by Does not  apply route in the morning, at noon, and at bedtime. May substitute to any manufacturer covered by patient's insurance. 300 each 3   lisinopril-hydrochlorothiazide (ZESTORETIC) 20-25 MG tablet Take 1 tablet by mouth daily. 90 tablet 1   meloxicam (MOBIC) 15 MG tablet Take 1 tablet (15 mg total) by mouth daily. 90 tablet 1   sildenafil (VIAGRA) 100 MG tablet Take 1 tablet (100 mg total) by mouth daily as needed for erectile dysfunction. 10 tablet 3   No current facility-administered medications on file prior to visit.    BP (!) 160/90   Pulse 73   Temp 98.1 F (36.7 C)   Wt (!) 367 lb (166.5 kg)   SpO2 97%   BMI 47.12 kg/m       Objective:   Physical Exam Vitals and nursing note reviewed.  Constitutional:      Appearance: Normal appearance. He is obese.  Cardiovascular:     Rate and Rhythm: Normal rate and regular rhythm.     Pulses: Normal pulses.     Heart sounds: Normal heart sounds.  Pulmonary:     Effort: Pulmonary effort is normal.     Breath sounds: Normal breath sounds.  Musculoskeletal:     Right knee: No swelling, bony tenderness or crepitus. Normal range of motion. No tenderness. No LCL laxity, MCL laxity, ACL laxity or PCL laxity. Normal pulse.     Instability Tests: Anterior drawer test negative. Posterior drawer test negative.  Skin:    General: Skin is warm and dry.  Neurological:     General: No focal deficit present.     Mental Status: He is alert and oriented to person, place, and time.  Psychiatric:         Mood and Affect: Mood normal.        Behavior: Behavior normal.        Thought Content: Thought content normal.        Judgment: Judgment normal.           Assessment & Plan:   1. Diabetes mellitus treated with oral medication (HCC)  - POC HgB A1c- 5.6  - Will start on mounjaro. Sample of 2.5 mg dose given and sent in 5 mg dose. This looks like it will be covered with his insurance.  - Will have him stop glipizide when he starts this medication.  - If covered follow up in 2 month   2. Essential hypertension - Needs to pick up his BP medication at the pharmacy    3. Morbid obesity (HCC) - Continue to eat healthy and exercise - He has lost about 20 pounds   4. Long-term current use of injectable noninsulin antidiabetic medication  - tirzepatide (MOUNJARO) 5 MG/0.5ML Pen; Inject 5 mg into the skin once a week.  Dispense: 2 mL; Refill: 0  5. Chronic pain of right knee - Consider MRI and or ortho consult  - DG Knee 1-2 Views Right; Future  6. Colon cancer screening  - Ambulatory referral to Gastroenterology   Shirline Frees, NP

## 2022-11-24 DIAGNOSIS — Z1211 Encounter for screening for malignant neoplasm of colon: Secondary | ICD-10-CM | POA: Diagnosis not present

## 2022-11-24 DIAGNOSIS — Z1212 Encounter for screening for malignant neoplasm of rectum: Secondary | ICD-10-CM | POA: Diagnosis not present

## 2022-11-30 LAB — COLOGUARD: COLOGUARD: NEGATIVE

## 2022-12-06 ENCOUNTER — Other Ambulatory Visit: Payer: Self-pay | Admitting: Adult Health

## 2022-12-06 DIAGNOSIS — Z7985 Long-term (current) use of injectable non-insulin antidiabetic drugs: Secondary | ICD-10-CM

## 2022-12-07 MED ORDER — TIRZEPATIDE 5 MG/0.5ML ~~LOC~~ SOAJ: 5.0000 mg | SUBCUTANEOUS | 0 refills | Status: DC

## 2022-12-13 ENCOUNTER — Ambulatory Visit: Payer: Managed Care, Other (non HMO) | Admitting: Adult Health

## 2022-12-13 ENCOUNTER — Encounter: Payer: Self-pay | Admitting: Adult Health

## 2022-12-13 VITALS — BP 128/80 | HR 73 | Temp 98.1°F | Ht 74.0 in | Wt 358.0 lb

## 2022-12-13 DIAGNOSIS — M25561 Pain in right knee: Secondary | ICD-10-CM | POA: Diagnosis not present

## 2022-12-13 DIAGNOSIS — G8929 Other chronic pain: Secondary | ICD-10-CM | POA: Diagnosis not present

## 2022-12-13 MED ORDER — METHYLPREDNISOLONE ACETATE 80 MG/ML IJ SUSP
80.0000 mg | Freq: Once | INTRAMUSCULAR | Status: AC
Start: 2022-12-13 — End: 2022-12-13
  Administered 2022-12-13: 80 mg via INTRA_ARTICULAR

## 2022-12-13 NOTE — Progress Notes (Signed)
Subjective:    Patient ID: Justin Norman, male    DOB: 05-03-1970, 52 y.o.   MRN: [id number]  HPI  52 year old male who  has a past medical history of Anxiety, Dry skin, ERECTILE DYSFUNCTION (03/03/2007), GERD (gastroesophageal reflux disease), Headache, HYPERTENSION (07/03/2007), Kidney stone, Obesities, morbid (HCC), Sleep apnea (02/13/2011), and Tear of medial meniscus of right knee (11/20/2013).  He presents to the office today for follow up regarding right knee pain.   Right knee pain - has had knee pain x 2 years intermittently. Feels as though the knee swells from time to time. Worse at night when he is laying down but will have pain with walking. 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  We discussed using a steroid injection to see if this would help alleviate some of his pain.   Wt Readings from Last 3 Encounters:  12/13/22 (!) 358 lb (162.4 kg)  11/16/22 (!) 367 lb (166.5 kg)  10/17/22 (!) 370 lb (167.8 kg)   Review of Systems See HPI   Past Medical History:  Diagnosis Date   Anxiety    during a divorce   Dry skin    ERECTILE DYSFUNCTION 03/03/2007   GERD (gastroesophageal reflux disease)    not taking meds   Headache    headaches   HYPERTENSION 07/03/2007   Kidney stone    Obesities, morbid (HCC)    Sleep apnea 02/13/2011   Tear of medial meniscus of right knee 11/20/2013    Social History   Socioeconomic History   Marital status: Married    Spouse name: Not on file   Number of children: 2   Years of education: Not on file   Highest education level: GED or equivalent  Occupational History    Employer: [employer]  Tobacco Use   Smoking status: Every Day    Current packs/day: 0.50    Average packs/day: 0.5 packs/day for 15.0 years (7.5 ttl pk-yrs)    Types: Cigarettes   Smokeless tobacco: Never  Substance and Sexual  Activity   Alcohol use: Yes    Comment: occasional   Drug use: No   Sexual activity: Not on file  Other Topics Concern   Not on file  Social History Narrative   Not on file   Social Determinants of Health   Financial Resource Strain: Medium Risk (07/11/2022)   Overall Financial Resource Strain (CARDIA)    Difficulty of Paying Living Expenses: Somewhat hard  Food Insecurity: Food Insecurity Present (07/11/2022)   Hunger Vital Sign    Worried About Running Out of Food in the Last Year: Sometimes true    Ran Out of Food in the Last Year: Sometimes true  Transportation Needs: No Transportation Needs (07/11/2022)   PRAPARE - Administrator, Civil Service (Medical): No    Lack of Transportation (Non-Medical): No  Physical Activity: Unknown (07/11/2022)   Exercise Vital Sign    Days of Exercise per Week: Patient declined    Minutes of Exercise per Session: Not on file  Stress: Stress Concern Present (07/11/2022)   [employer] of Occupational Health - Occupational Stress Questionnaire    Feeling of Stress : Rather much  Social Connections: Unknown (07/11/2022)   Social Connection and Isolation Panel [NHANES]    Frequency of Communication with Friends and Family: Three times a week    Frequency of Social  Gatherings with Friends and Family: Once a week    Attends Religious Services: Patient declined    Active Member of Clubs or Organizations: No    Attends Engineer, structural: Not on file    Marital Status: Married  Catering manager Violence: Not on file    Past Surgical History:  Procedure Laterality Date   KNEE ARTHROSCOPY Right 11/20/2013   Procedure: RIGHT KNEE ARTHROSCOPY WITH MEDIAL MENISCECTOMY ;  Surgeon: Eulas Post, MD;  Location: [facility];  Service: Orthopedics;  Laterality: Right;   MASS EXCISION Right 09/18/2019   Procedure: EXCISION MASS RIGHT WRIST;  Surgeon: Betha Loa, MD;  Location: [facility];  Service: Orthopedics;   Laterality: Right;  GENERAL OR AXILLARY BLOCK   vocal cord abscess surgery      Family History  Problem Relation Age of Onset   Hypertension Mother    Arthritis Mother    Dementia Mother    Hypertension Sister    Arrhythmia Sister        pace maker   Diabetes Sister    Kidney disease Sister    Scoliosis Daughter    Stroke Maternal Grandmother    Diabetes Maternal Grandfather     Allergies  Allergen Reactions   Keflex [Cephalexin]    Metformin And Related     Diarrhea and nausea     Current Outpatient Medications on File Prior to Visit  Medication Sig Dispense Refill   amLODipine (NORVASC) 5 MG tablet Take 1 tablet (5 mg total) by mouth daily. 90 tablet 1   atorvastatin (LIPITOR) 40 MG tablet Take 1 tablet (40 mg total) by mouth daily. 90 tablet 1   ciprofloxacin-dexamethasone (CIPRODEX) OTIC suspension INSTILL 4 DROPS INTO RIGHT EAR TWICE A DAY FOR 7 DAYS 7.5 mL 0   glipiZIDE (GLUCOTROL XL) 2.5 MG 24 hr tablet TAKE 1 TABLET BY MOUTH DAILY WITH BREAKFAST. 90 tablet 0   hydrocortisone (ANUSOL-HC) 2.5 % rectal cream Place 1 Application rectally 2 (two) times daily. 30 g 0   lisinopril-hydrochlorothiazide (ZESTORETIC) 20-25 MG tablet Take 1 tablet by mouth daily. 90 tablet 1   meloxicam (MOBIC) 15 MG tablet Take 1 tablet (15 mg total) by mouth daily. 90 tablet 1   sildenafil (VIAGRA) 100 MG tablet Take 1 tablet (100 mg total) by mouth daily as needed for erectile dysfunction. 10 tablet 3   tirzepatide (MOUNJARO) 5 MG/0.5ML Pen Inject 5 mg into the skin once a week. 2 mL 0   Accu-Chek Softclix Lancets lancets PLEASE SEE ATTACHED FOR DETAILED DIRECTIONS (Patient not taking: Reported on 12/13/2022)     Blood Glucose Monitoring Suppl (ACCU-CHEK GUIDE) w/Device KIT USE MORNING AT NOON AND AT BEDTIME AS DIRECTED (Patient not taking: Reported on 12/13/2022)     Blood Glucose Monitoring Suppl DEVI 1 each by Does not apply route in the morning, at noon, and at bedtime. May substitute to any  manufacturer covered by patient's insurance. (Patient not taking: Reported on 12/13/2022) 1 each 0   No current facility-administered medications on file prior to visit.    BP 128/80   Pulse 73   Temp 98.1 F (36.7 C) (Oral)   Ht 6\' 2"  (1.88 m)   Wt (!) 358 lb (162.4 kg)   SpO2 97%   BMI 45.96 kg/m   *    Objective:   Physical Exam Vitals and nursing note reviewed.  Constitutional:      Appearance: Normal appearance.  Musculoskeletal:  General: Tenderness present. No swelling or deformity. Normal range of motion.     Right knee: Crepitus present. No swelling, deformity or effusion. Normal range of motion. No LCL laxity, MCL laxity, ACL laxity or PCL laxity. Normal patellar mobility. Normal pulse.  Neurological:     General: No focal deficit present.     Mental Status: He is alert and oriented to person, place, and time.  Psychiatric:        Mood and Affect: Mood normal.        Behavior: Behavior normal.        Thought Content: Thought content normal.        Judgment: Judgment normal.        Assessment & Plan:  1. Chronic pain of right knee Discussed risks and benefits of corticosteroid injection and patient consented.  After prepping skin with betadine, injected 80 mg depomedrol and 2 cc of plain xylocaine with 22 gauge one and one half inch needle using anterolateral approach and pt tolerated well.  - methylPREDNISolone acetate (DEPO-MEDROL) injection 80 mg - Continue to work on weight loss - Follow up as needed   Shirline Frees, NP

## 2022-12-31 ENCOUNTER — Encounter: Payer: Self-pay | Admitting: Family Medicine

## 2022-12-31 ENCOUNTER — Ambulatory Visit (INDEPENDENT_AMBULATORY_CARE_PROVIDER_SITE_OTHER): Payer: Managed Care, Other (non HMO) | Admitting: Family Medicine

## 2022-12-31 VITALS — BP 128/90 | HR 60 | Temp 98.6°F | Wt 359.0 lb

## 2022-12-31 DIAGNOSIS — R6883 Chills (without fever): Secondary | ICD-10-CM

## 2022-12-31 DIAGNOSIS — R109 Unspecified abdominal pain: Secondary | ICD-10-CM | POA: Diagnosis not present

## 2022-12-31 DIAGNOSIS — B349 Viral infection, unspecified: Secondary | ICD-10-CM | POA: Diagnosis not present

## 2022-12-31 DIAGNOSIS — R52 Pain, unspecified: Secondary | ICD-10-CM

## 2022-12-31 LAB — POCT INFLUENZA A/B
Influenza A, POC: NEGATIVE
Influenza B, POC: NEGATIVE

## 2022-12-31 LAB — POC COVID19 BINAXNOW: SARS Coronavirus 2 Ag: NEGATIVE

## 2022-12-31 NOTE — Progress Notes (Signed)
   Subjective:    Patient ID: Justin Norman, male    DOB: Jul 10, 1970, 52 y.o.   MRN: [id number]  HPI Here for 3 days of body aches, chills, stomach cramps, diarrhea, and sinus congestion. No ST or cough. He feels better today, but he is missing work. He is drinking fluids and taking Tylenol.    Review of Systems  Constitutional:  Positive for chills. Negative for diaphoresis.  HENT:  Positive for congestion. Negative for ear pain, postnasal drip, sinus pain and sore throat.   Eyes: Negative.   Respiratory: Negative.    Cardiovascular: Negative.   Gastrointestinal:  Positive for abdominal pain and diarrhea. Negative for abdominal distention, blood in stool, constipation, nausea and vomiting.       Objective:   Physical Exam Constitutional:      Appearance: Normal appearance. He is not ill-appearing.  HENT:     Right Ear: Tympanic membrane, ear canal and external ear normal.     Left Ear: Tympanic membrane, ear canal and external ear normal.     Nose: Nose normal.     Mouth/Throat:     Pharynx: Oropharynx is clear.  Eyes:     Conjunctiva/sclera: Conjunctivae normal.  Pulmonary:     Effort: Pulmonary effort is normal.     Breath sounds: Normal breath sounds.  Abdominal:     General: Abdomen is flat. Bowel sounds are normal. There is no distension.     Palpations: Abdomen is soft. There is no mass.     Tenderness: There is no abdominal tenderness. There is no right CVA tenderness, left CVA tenderness, guarding or rebound.     Hernia: No hernia is present.  Lymphadenopathy:     Cervical: No cervical adenopathy.  Neurological:     Mental Status: He is alert.           Assessment & Plan:  Viral illness. He will rest and drink fluids. We wrote him out of work for today. Recheck as needed. Gershon Crane, MD

## 2023-01-04 ENCOUNTER — Other Ambulatory Visit: Payer: Self-pay | Admitting: Adult Health

## 2023-01-04 DIAGNOSIS — Z7985 Long-term (current) use of injectable non-insulin antidiabetic drugs: Secondary | ICD-10-CM

## 2023-01-29 ENCOUNTER — Encounter (HOSPITAL_COMMUNITY): Payer: Self-pay

## 2023-01-29 ENCOUNTER — Ambulatory Visit (HOSPITAL_COMMUNITY)
Admission: EM | Admit: 2023-01-29 | Discharge: 2023-01-29 | Disposition: A | Discharge status: Home / Self Care | Payer: Commercial Managed Care - HMO | Attending: Family Medicine | Admitting: Family Medicine

## 2023-01-29 ENCOUNTER — Ambulatory Visit (INDEPENDENT_AMBULATORY_CARE_PROVIDER_SITE_OTHER): Payer: Commercial Managed Care - HMO

## 2023-01-29 DIAGNOSIS — J189 Pneumonia, unspecified organism: Secondary | ICD-10-CM

## 2023-01-29 DIAGNOSIS — J101 Influenza due to other identified influenza virus with other respiratory manifestations: Secondary | ICD-10-CM | POA: Diagnosis not present

## 2023-01-29 DIAGNOSIS — R918 Other nonspecific abnormal finding of lung field: Secondary | ICD-10-CM | POA: Diagnosis not present

## 2023-01-29 DIAGNOSIS — R509 Fever, unspecified: Secondary | ICD-10-CM | POA: Diagnosis not present

## 2023-01-29 DIAGNOSIS — R059 Cough, unspecified: Secondary | ICD-10-CM | POA: Diagnosis not present

## 2023-01-29 DIAGNOSIS — J90 Pleural effusion, not elsewhere classified: Secondary | ICD-10-CM | POA: Diagnosis not present

## 2023-01-29 DIAGNOSIS — J441 Chronic obstructive pulmonary disease with (acute) exacerbation: Secondary | ICD-10-CM

## 2023-01-29 LAB — POCT INFLUENZA A/B
Influenza A, POC: POSITIVE — AB
Influenza B, POC: NEGATIVE

## 2023-01-29 MED ORDER — DOXYCYCLINE HYCLATE 100 MG PO CAPS: 100.0000 mg | ORAL_CAPSULE | Freq: Two times a day (BID) | ORAL | 0 refills | Status: AC

## 2023-01-29 MED ORDER — ALBUTEROL SULFATE (2.5 MG/3ML) 0.083% IN NEBU
2.5000 mg | INHALATION_SOLUTION | Freq: Once | RESPIRATORY_TRACT | Status: AC
  Administered 2023-01-29: 2.5 mg via RESPIRATORY_TRACT

## 2023-01-29 MED ORDER — PREDNISONE 20 MG PO TABS: 40.0000 mg | ORAL_TABLET | Freq: Every day | ORAL | 0 refills | Status: AC

## 2023-01-29 MED ORDER — ALBUTEROL SULFATE HFA 108 (90 BASE) MCG/ACT IN AERS
2.0000 | INHALATION_SPRAY | RESPIRATORY_TRACT | 0 refills | Status: DC | PRN
Start: 2023-01-29 — End: 2023-09-05

## 2023-01-29 MED ORDER — ALBUTEROL SULFATE (2.5 MG/3ML) 0.083% IN NEBU
INHALATION_SOLUTION | RESPIRATORY_TRACT | Status: AC
  Filled 2023-01-29: qty 3

## 2023-01-29 MED ORDER — OSELTAMIVIR PHOSPHATE 75 MG PO CAPS: 75.0000 mg | ORAL_CAPSULE | Freq: Two times a day (BID) | ORAL | 0 refills | Status: DC

## 2023-01-29 NOTE — ED Provider Notes (Signed)
[facility]    CSN: [id number] Arrival date & time: 01/29/23  1335      History   Chief Complaint Chief Complaint  Patient presents with   Cough    HPI Justin Norman is a 52 y.o. male.    Cough Here for cough and congestion and fever and chills.  He is also had some myalgia and chest congestion.  He has felt short of breath but has not noted any wheezing.   He does smoke cigarettes.  He does not report any history of asthma or having to use an inhaler before.  He is allergic to Keflex and metformin.   Past Medical History:  Diagnosis Date   Anxiety    during a divorce   Dry skin    ERECTILE DYSFUNCTION 03/03/2007   GERD (gastroesophageal reflux disease)    not taking meds   Headache    headaches   HYPERTENSION 07/03/2007   Kidney stone    Obesities, morbid (HCC)    Sleep apnea 02/13/2011   Tear of medial meniscus of right knee 11/20/2013    Patient Active Problem List   Diagnosis Date Noted   Stage 3b chronic kidney disease (HCC) 09/06/2022   Vitamin D deficiency 07/16/2018   Hyperlipidemia 07/16/2018   Tobacco use disorder 09/06/2015   DM (diabetes mellitus), type 2 (HCC) 01/11/2015   Osteoarthritis of both hips 07/09/2014   Tear of medial meniscus of right knee 11/20/2013   Tear, knee, medial meniscus 11/20/2013   OSA (obstructive sleep apnea) 09/18/2012   Perirectal abscess, 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   Morbid obesity (HCC) 10/05/2011   Testosterone deficiency 06/27/2011   Essential hypertension 07/03/2007   ERECTILE DYSFUNCTION 03/03/2007   HYPERTENSION NEC 03/03/2007    Past Surgical History:  Procedure Laterality Date   KNEE ARTHROSCOPY Right 11/20/2013   Procedure: RIGHT KNEE ARTHROSCOPY WITH MEDIAL MENISCECTOMY ;  Surgeon: Eulas Post, MD;  Location: [facility];  Service: Orthopedics;  Laterality: Right;   MASS EXCISION Right 09/18/2019   Procedure: EXCISION MASS RIGHT WRIST;  Surgeon: Betha Loa, MD;  Location: [facility];  Service: Orthopedics;  Laterality: Right;  GENERAL OR AXILLARY BLOCK   vocal cord abscess surgery         Home Medications    Prior to Admission medications   Medication Sig Start Date End Date Taking? Authorizing Provider  albuterol (VENTOLIN HFA) 108 (90 Base) MCG/ACT inhaler Inhale 2 puffs into the lungs every 4 (four) hours as needed for wheezing or shortness of breath. 01/29/23  Yes Zenia Resides, MD  doxycycline (VIBRAMYCIN) 100 MG capsule Take 1 capsule (100 mg total) by mouth 2 (two) times daily for 7 days. 01/29/23 02/05/23 Yes Zenia Resides, MD  oseltamivir (TAMIFLU) 75 MG capsule Take 1 capsule (75 mg total) by mouth every 12 (twelve) hours. 01/29/23  Yes Zenia Resides, MD  predniSONE (DELTASONE) 20 MG tablet Take 2 tablets (40 mg total) by mouth daily with breakfast for 5 days. 01/29/23 02/03/23 Yes Zenia Resides, MD  Accu-Chek Softclix Lancets lancets PLEASE SEE ATTACHED FOR DETAILED DIRECTIONS Patient not taking: Reported on 12/13/2022 10/19/22   [provider]  amLODipine (NORVASC) 5 MG tablet Take 1 tablet (5 mg total) by mouth daily. 08/30/22   Philip Aspen, Limmie Patricia, MD  atorvastatin (LIPITOR) 40 MG tablet Take 1 tablet (40 mg total) by mouth daily. 09/06/22   Philip Aspen, Limmie Patricia, MD  Blood Glucose Monitoring Suppl (ACCU-CHEK GUIDE)  w/Device KIT USE MORNING AT NOON AND AT BEDTIME AS DIRECTED Patient not taking: Reported on 12/13/2022 10/18/22   [provider]  Blood Glucose Monitoring Suppl DEVI 1 each by Does not apply route in the morning, at noon, and at bedtime. May substitute to any manufacturer covered by patient's insurance. 10/18/22   Nafziger, Kandee Keen, NP  ciprofloxacin-dexamethasone (CIPRODEX) OTIC suspension INSTILL 4 DROPS INTO RIGHT EAR TWICE A DAY FOR 7 DAYS 11/01/22   Nafziger, Kandee Keen, NP  glipiZIDE (GLUCOTROL XL) 2.5 MG 24 hr tablet TAKE 1 TABLET BY MOUTH DAILY WITH BREAKFAST. 11/01/22   Nafziger, Kandee Keen,  NP  hydrocortisone (ANUSOL-HC) 2.5 % rectal cream Place 1 Application rectally 2 (two) times daily. 11/01/22   Philip Aspen, Limmie Patricia, MD  lisinopril-hydrochlorothiazide (ZESTORETIC) 20-25 MG tablet Take 1 tablet by mouth daily. 07/12/22   Philip Aspen, Limmie Patricia, MD  sildenafil (VIAGRA) 100 MG tablet Take 1 tablet (100 mg total) by mouth daily as needed for erectile dysfunction. 10/17/22   Nafziger, Kandee Keen, NP  tirzepatide [facility]) 5 MG/0.5ML Pen Inject 5 mg into the skin once a week. Will need follow up appt. For further refills. 01/04/23   Nafziger, Kandee Keen, NP    Family History Family History  Problem Relation Age of Onset   Hypertension Mother    Arthritis Mother    Dementia Mother    Hypertension Sister    Arrhythmia Sister        Visual merchandiser   Diabetes Sister    Kidney disease Sister    Scoliosis Daughter    Stroke Maternal Grandmother    Diabetes Maternal Grandfather     Social History Social History   Tobacco Use   Smoking status: Every Day    Current packs/day: 0.50    Average packs/day: 0.5 packs/day for 15.0 years (7.5 ttl pk-yrs)    Types: Cigarettes   Smokeless tobacco: Never  Substance Use Topics   Alcohol use: Yes    Comment: occasional   Drug use: No     Allergies   Keflex [cephalexin] and Metformin and related   Review of Systems Review of Systems  Respiratory:  Positive for cough.      Physical Exam Triage Vital Signs ED Triage Vitals  Encounter Vitals Group     BP 01/29/23 1510 (!) 176/100     Systolic BP Percentile --      Diastolic BP Percentile --      Pulse Rate 01/29/23 1510 81     Resp 01/29/23 1510 18     Temp 01/29/23 1510 98.8 F (37.1 C)     Temp Source 01/29/23 1510 Oral     SpO2 01/29/23 1510 90 %     Weight --      Height --      Head Circumference --      Peak Flow --      Pain Score 01/29/23 1511 5     Pain Loc --      Pain Education --      Exclude from Growth Chart --    No data found.  Updated Vital  Signs BP (!) 176/100 (BP Location: Left Arm) Comment: states hasn't had his b/p meds in weeks  Pulse 81   Temp 98.8 F (37.1 C) (Oral)   Resp 18   SpO2 90%   Visual Acuity Right Eye Distance:   Left Eye Distance:   Bilateral Distance:    Right Eye Near:   Left Eye Near:  Bilateral Near:     Physical Exam Vitals reviewed.  Constitutional:      General: He is not in acute distress.    Appearance: He is not ill-appearing, toxic-appearing or diaphoretic.     Comments: O2 sat is 90% on room air.  While he is talking to me at this to 86% and then 88% and then returned to 90%  HENT:     Right Ear: Tympanic membrane and ear canal normal.     Left Ear: Tympanic membrane and ear canal normal.     Nose: Nose normal.     Mouth/Throat:     Mouth: Mucous membranes are moist.     Comments: There is some erythema of the soft palate.  No asymmetry.  There is clear mucus draining Eyes:     Extraocular Movements: Extraocular movements intact.     Conjunctiva/sclera: Conjunctivae normal.     Pupils: Pupils are equal, round, and reactive to light.  Cardiovascular:     Rate and Rhythm: Normal rate and regular rhythm.     Heart sounds: No murmur heard. Pulmonary:     Breath sounds: No stridor. No rhonchi or rales.     Comments: Overall breath sounds are coarse.  There is maybe some low pitched expiratory wheeze in the lower lung fields bilaterally.  Air movement is fairly good Musculoskeletal:     Cervical back: Neck supple.  Lymphadenopathy:     Cervical: No cervical adenopathy.  Skin:    Capillary Refill: Capillary refill takes less than 2 seconds.     Coloration: Skin is not jaundiced or pale.  Neurological:     General: No focal deficit present.     Mental Status: He is alert and oriented to person, place, and time.  Psychiatric:        Behavior: Behavior normal.      UC Treatments / Results  Labs (all labs ordered are listed, but only abnormal results are displayed) Labs  Reviewed  POCT INFLUENZA A/B - Abnormal; Notable for the following components:      Result Value   Influenza A, POC Positive (*)    All other components within normal limits    EKG   Radiology No results found.  Procedures Procedures (including critical care time)  Medications Ordered in UC Medications  albuterol (PROVENTIL) (2.5 MG/3ML) 0.083% nebulizer solution 2.5 mg (2.5 mg Nebulization Given 01/29/23 1531)    Initial Impression / Assessment and Plan / UC Course  I have reviewed the triage vital signs and the nursing notes.  Pertinent labs & imaging results that were available during my care of the patient were reviewed by me and considered in my medical decision making (see chart for details).     Test is positive for influenza A.  His chest x-ray by my review has either effusion or infiltrate in the right lower lobe.  He is advised of radiology overread.  After the nebulizer treatment his O2 sats improved to 96% on room air.  Also he had a tiny amount of end expiratory wheezing in the right lower lobe after the treatment  Tamiflu was sent in to treat the flu.  Albuterol and prednisone are sent in to treat possible COPD exacerbation and doxycycline is sent in to treat potential pneumonia    Final Clinical Impressions(s) / UC Diagnoses   Final diagnoses:  Influenza A  COPD exacerbation (HCC)  Pneumonia of right lower lobe due to infectious organism     Discharge Instructions  Your test was positive for influenza A  Your chest x-ray has either fluid or pneumonia on the right lower side. The radiologist will also read your x-ray, and if their interpretation differs significantly from mine, we will call you.  Take oseltamivir 75 mg--1 capsule 2 times daily for 5 days  Albuterol inhaler--do 2 puffs every 4 hours as needed for shortness of breath or wheezing  Take prednisone 20 mg--2 daily for 5 days; this is for inflammation in your lungs.  This  medication can raise your sugars.  He is good you can on your diet and drink plenty of fluids.  Take doxycycline 100 mg --1 capsule 2 times daily for 7 days; this is the antibiotic for your pneumonia  Please follow-up with your primary care about the abnormal chest x-ray     ED Prescriptions     Medication Sig Dispense Auth. Provider   oseltamivir (TAMIFLU) 75 MG capsule Take 1 capsule (75 mg total) by mouth every 12 (twelve) hours. 10 capsule Zenia Resides, MD   albuterol (VENTOLIN HFA) 108 (90 Base) MCG/ACT inhaler Inhale 2 puffs into the lungs every 4 (four) hours as needed for wheezing or shortness of breath. 1 each Zenia Resides, MD   predniSONE (DELTASONE) 20 MG tablet Take 2 tablets (40 mg total) by mouth daily with breakfast for 5 days. 10 tablet Zenia Resides, MD   doxycycline (VIBRAMYCIN) 100 MG capsule Take 1 capsule (100 mg total) by mouth 2 (two) times daily for 7 days. 14 capsule Marlinda Mike, Janace Aris, MD      PDMP not reviewed this encounter.   Zenia Resides, MD 01/29/23 [phone]

## 2023-01-29 NOTE — Discharge Instructions (Addendum)
Your test was positive for influenza A  Your chest x-ray has either fluid or pneumonia on the right lower side. The radiologist will also read your x-ray, and if their interpretation differs significantly from mine, we will call you.  Take oseltamivir 75 mg--1 capsule 2 times daily for 5 days  Albuterol inhaler--do 2 puffs every 4 hours as needed for shortness of breath or wheezing  Take prednisone 20 mg--2 daily for 5 days; this is for inflammation in your lungs.  This medication can raise your sugars.  He is good you can on your diet and drink plenty of fluids.  Take doxycycline 100 mg --1 capsule 2 times daily for 7 days; this is the antibiotic for your pneumonia  Please follow-up with your primary care about the abnormal chest x-ray

## 2023-01-29 NOTE — ED Triage Notes (Signed)
Pt c/o productive cough with green sputum, chills, fatigue, body aches, nasal/chest congestion, and SOB since yesterday. Took OTC meds with no relief.

## 2023-02-05 ENCOUNTER — Other Ambulatory Visit: Payer: Self-pay | Admitting: Adult Health

## 2023-02-05 DIAGNOSIS — Z7985 Long-term (current) use of injectable non-insulin antidiabetic drugs: Secondary | ICD-10-CM

## 2023-02-05 MED ORDER — MOUNJARO 5 MG/0.5ML ~~LOC~~ SOAJ: 5.0000 mg | SUBCUTANEOUS | 0 refills | Status: DC

## 2023-02-05 NOTE — Telephone Encounter (Signed)
Copied from CRM [id number]. Topic: Clinical - Medication Refill >> Feb 05, 2023 11:32 AM Elita Quick wrote: Most Recent Primary Care Visit:  Provider: Gershon Crane A  Department: [facility]  Visit Type: OFFICE VISIT  Date: 12/31/2022  Medication: tirzepatide Greggory Keen) 5 MG/0.5ML Pen  Has the patient contacted their pharmacy? Yes (Agent: If no, request that the patient contact the pharmacy for the refill. If patient does not wish to contact the pharmacy document the reason why and proceed with request.) (Agent: If yes, when and what did the pharmacy advise?)  Is this the correct pharmacy for this prescription? Yes If no, delete pharmacy and type the correct one.  This is the patient's preferred pharmacy:  [id number] - [place], [place] - [address] AT [facility] [address] [place] [address] Phone: [phone] Fax: [phone]   Has the prescription been filled recently? Yes  Is the patient out of the medication? Yes  Has the patient been seen for an appointment in the last year OR does the patient have an upcoming appointment? Yes  Can we respond through MyChart? Yes  Agent: Please be advised that Rx refills may take up to 3 business days. We ask that you follow-up with your pharmacy.

## 2023-03-24 ENCOUNTER — Other Ambulatory Visit: Payer: Self-pay | Admitting: Adult Health

## 2023-03-24 DIAGNOSIS — Z7985 Long-term (current) use of injectable non-insulin antidiabetic drugs: Secondary | ICD-10-CM

## 2023-03-27 MED ORDER — MOUNJARO 5 MG/0.5ML ~~LOC~~ SOAJ: 5.0000 mg | SUBCUTANEOUS | 0 refills | Status: DC

## 2023-03-29 ENCOUNTER — Other Ambulatory Visit: Payer: Self-pay | Admitting: Internal Medicine

## 2023-03-29 DIAGNOSIS — I10 Essential (primary) hypertension: Secondary | ICD-10-CM

## 2023-04-02 NOTE — Telephone Encounter (Signed)
 Pt TOC to Dr.Hernandez 2024

## 2023-05-23 ENCOUNTER — Ambulatory Visit (INDEPENDENT_AMBULATORY_CARE_PROVIDER_SITE_OTHER): Admitting: Adult Health

## 2023-05-23 ENCOUNTER — Encounter: Payer: Self-pay | Admitting: Adult Health

## 2023-05-23 VITALS — BP 130/90 | HR 66 | Temp 97.9°F | Ht 74.0 in | Wt 333.0 lb

## 2023-05-23 DIAGNOSIS — I10 Essential (primary) hypertension: Secondary | ICD-10-CM | POA: Diagnosis not present

## 2023-05-23 DIAGNOSIS — E119 Type 2 diabetes mellitus without complications: Secondary | ICD-10-CM | POA: Diagnosis not present

## 2023-05-23 DIAGNOSIS — M7631 Iliotibial band syndrome, right leg: Secondary | ICD-10-CM

## 2023-05-23 DIAGNOSIS — Z7984 Long term (current) use of oral hypoglycemic drugs: Secondary | ICD-10-CM

## 2023-05-23 DIAGNOSIS — Z7985 Long-term (current) use of injectable non-insulin antidiabetic drugs: Secondary | ICD-10-CM

## 2023-05-23 LAB — POCT GLYCOSYLATED HEMOGLOBIN (HGB A1C): Hemoglobin A1C: 5.6 % (ref 4.0–5.6)

## 2023-05-23 MED ORDER — LISINOPRIL-HYDROCHLOROTHIAZIDE 20-25 MG PO TABS: 1.0000 | ORAL_TABLET | Freq: Every day | ORAL | 1 refills | Status: AC

## 2023-05-23 MED ORDER — TIRZEPATIDE 5 MG/0.5ML ~~LOC~~ SOAJ: 5.0000 mg | SUBCUTANEOUS | 0 refills | Status: DC

## 2023-05-23 MED ORDER — AMLODIPINE BESYLATE 5 MG PO TABS: 5.0000 mg | ORAL_TABLET | Freq: Every day | ORAL | 1 refills | Status: AC

## 2023-05-23 NOTE — Progress Notes (Signed)
 Subjective:    Patient ID: Justin Norman, male    DOB: 29-May-1970, 53 y.o.   MRN: [id number]  HPI  53 year old male who  has a past medical history of Anxiety, Dry skin, ERECTILE DYSFUNCTION (03/03/2007), GERD (gastroesophageal reflux disease), Headache, HYPERTENSION (07/03/2007), Kidney stone, Obesities, morbid (HCC), Sleep apnea (02/13/2011), and Tear of medial meniscus of right knee (11/20/2013).  He presents to the office today for follow up regarding DM/HTN/Obesity   DM Type 2 - managed with Glipizide 2.5 mg ER. Metformin caused GI issues. He has not been checking his blood sugars at home. When he was last seen he was started on [facility] but has not been on this medication for the last three months due to his insurance changing with a change in job. [name] He has been taking Glipizide in the meantime. He reports that when he was taking Mounjaro he was tolerating this medication well. He would like to go back on this medication now that he has insurance.  Lab Results  Component Value Date   HGBA1C 5.6 11/16/2022   HGBA1C 6.7 (H) 08/30/2022   HGBA1C 6.0 07/15/2018   HTN - Managed with Norvasc 5 mg daily ( ran out of medication) and Zestoretic 20-25 mg daily. He denies dizziness, lightheadedness, blurred vision or headaches. BP Readings from Last 3 Encounters:  05/23/23 (!) 130/90  01/29/23 (!) 176/100  12/31/22 (!) 128/90   Obesity - he has been working on diet through exercise and eating healthier Wt Readings from Last 3 Encounters:  05/23/23 (!) 333 lb (151 kg)  12/31/22 (!) 359 lb (162.8 kg)  12/13/22 (!) 358 lb (162.4 kg)   Acutely he has been experiencing right upper leg pain that has been present for about 2 months.  First he did not mind the pain but as time has gone on the discomfort has gotten worse.  At times he can feel as a burning sensation.  Pain is more apparent with pivoting motions as well as with squatting.  He denies trauma or aggravating injury.  He has not really  been trying anything at home  Review of Systems See HPI   Past Medical History:  Diagnosis Date   Anxiety    during a divorce   Dry skin    ERECTILE DYSFUNCTION 03/03/2007   GERD (gastroesophageal reflux disease)    not taking meds   Headache    headaches   HYPERTENSION 07/03/2007   Kidney stone    Obesities, morbid (HCC)    Sleep apnea 02/13/2011   Tear of medial meniscus of right knee 11/20/2013    Social History   Socioeconomic History   Marital status: Married    Spouse name: Not on file   Number of children: 2   Years of education: Not on file   Highest education level: GED or equivalent  Occupational History    Employer: [employer]  Tobacco Use   Smoking status: Every Day    Current packs/day: 0.50    Average packs/day: 0.5 packs/day for 15.0 years (7.5 ttl pk-yrs)    Types: Cigarettes   Smokeless tobacco: Never  Substance and Sexual Activity   Alcohol use: Yes    Comment: occasional   Drug use: No   Sexual activity: Not on file  Other Topics Concern   Not on file  Social History Narrative   Not on file   Social Drivers of Health   Financial Resource Strain: Medium Risk (07/11/2022)   Overall Financial Resource  Strain (CARDIA)    Difficulty of Paying Living Expenses: Somewhat hard  Food Insecurity: Food Insecurity Present (07/11/2022)   Hunger Vital Sign    Worried About Running Out of Food in the Last Year: Sometimes true    Ran Out of Food in the Last Year: Sometimes true  Transportation Needs: No Transportation Needs (07/11/2022)   PRAPARE - Administrator, Civil Service (Medical): No    Lack of Transportation (Non-Medical): No  Physical Activity: Unknown (07/11/2022)   Exercise Vital Sign    Days of Exercise per Week: Patient declined    Minutes of Exercise per Session: Not on file  Stress: Stress Concern Present (07/11/2022)   [employer] of Occupational Health - Occupational Stress Questionnaire    Feeling of Stress :  Rather much  Social Connections: Unknown (07/11/2022)   Social Connection and Isolation Panel [NHANES]    Frequency of Communication with Friends and Family: Three times a week    Frequency of Social Gatherings with Friends and Family: Once a week    Attends Religious Services: Patient declined    Active Member of Clubs or Organizations: No    Attends Engineer, structural: Not on file    Marital Status: Married  Catering manager Violence: Not on file    Past Surgical History:  Procedure Laterality Date   KNEE ARTHROSCOPY Right 11/20/2013   Procedure: RIGHT KNEE ARTHROSCOPY WITH MEDIAL MENISCECTOMY ;  Surgeon: Eulas Post, MD;  Location: [facility] OR;  Service: Orthopedics;  Laterality: Right;   MASS EXCISION Right 09/18/2019   Procedure: EXCISION MASS RIGHT WRIST;  Surgeon: Betha Loa, MD;  Location: [facility];  Service: Orthopedics;  Laterality: Right;  GENERAL OR AXILLARY BLOCK   vocal cord abscess surgery      Family History  Problem Relation Age of Onset   Hypertension Mother    Arthritis Mother    Dementia Mother    Hypertension Sister    Arrhythmia Sister        pace maker   Diabetes Sister    Kidney disease Sister    Scoliosis Daughter    Stroke Maternal Grandmother    Diabetes Maternal Grandfather     Allergies  Allergen Reactions   Keflex [Cephalexin]    Metformin And Related     Diarrhea and nausea     Current Outpatient Medications on File Prior to Visit  Medication Sig Dispense Refill   Accu-Chek Softclix Lancets lancets      albuterol (VENTOLIN HFA) 108 (90 Base) MCG/ACT inhaler Inhale 2 puffs into the lungs every 4 (four) hours as needed for wheezing or shortness of breath. 1 each 0   atorvastatin (LIPITOR) 40 MG tablet Take 1 tablet (40 mg total) by mouth daily. 90 tablet 1   Blood Glucose Monitoring Suppl (ACCU-CHEK GUIDE) w/Device KIT      Blood Glucose Monitoring Suppl DEVI 1 each by Does not apply route in the morning, at noon,  and at bedtime. May substitute to any manufacturer covered by patient's insurance. 1 each 0   glipiZIDE (GLUCOTROL XL) 2.5 MG 24 hr tablet TAKE 1 TABLET BY MOUTH DAILY WITH BREAKFAST. 90 tablet 0   hydrocortisone (ANUSOL-HC) 2.5 % rectal cream Place 1 Application rectally 2 (two) times daily. 30 g 0   lisinopril-hydrochlorothiazide (ZESTORETIC) 20-25 MG tablet Take 1 tablet by mouth daily. 90 tablet 1   sildenafil (VIAGRA) 100 MG tablet Take 1 tablet (100 mg total) by mouth daily as needed  for erectile dysfunction. 10 tablet 3   amLODipine (NORVASC) 5 MG tablet Take 1 tablet (5 mg total) by mouth daily. (Patient not taking: Reported on 05/23/2023) 90 tablet 1   No current facility-administered medications on file prior to visit.    BP (!) 130/90   Pulse 66   Temp 97.9 F (36.6 C) (Oral)   Ht 6\' 2"  (1.88 m)   Wt (!) 333 lb (151 kg)   SpO2 97%   BMI 42.75 kg/m       Objective:   Physical Exam Vitals and nursing note reviewed.  Constitutional:      Appearance: Normal appearance. He is obese.  Cardiovascular:     Rate and Rhythm: Normal rate and regular rhythm.     Pulses: Normal pulses.     Heart sounds: Normal heart sounds.  Pulmonary:     Effort: Pulmonary effort is normal.     Breath sounds: Normal breath sounds.  Musculoskeletal:        General: Tenderness present.     Right upper leg: Tenderness (along IT band) present. No edema.  Skin:    General: Skin is warm and dry.  Neurological:     General: No focal deficit present.     Mental Status: He is alert and oriented to person, place, and time.  Psychiatric:        Mood and Affect: Mood normal.        Behavior: Behavior normal.        Thought Content: Thought content normal.        Judgment: Judgment normal.        Assessment & Plan:  1. Diabetes mellitus treated with oral medication (HCC) (Primary)  - POC HgB A1c- 5.6  - Will have him d/c glipizide  2. Essential hypertension - not at goal. Will refill his  medications  - amLODipine (NORVASC) 5 MG tablet; Take 1 tablet (5 mg total) by mouth daily.  Dispense: 90 tablet; Refill: 1 - lisinopril-hydrochlorothiazide (ZESTORETIC) 20-25 MG tablet; Take 1 tablet by mouth daily.  Dispense: 90 tablet; Refill: 1  3. Morbid obesity (HCC) - He has lost 26 pounds since he was last seen  - tirzepatide (MOUNJARO) 5 MG/0.5ML Pen; Inject 5 mg into the skin once a week.  Dispense: 2 mL; Refill: 0  4. Long-term current use of injectable noninsulin antidiabetic medication - sample of Mounjaro 2.5 mg given. Will send in Mounjaro 5 mg  - tirzepatide [facility]) 5 MG/0.5ML Pen; Inject 5 mg into the skin once a week.  Dispense: 2 mL; Refill: 0  5. Iliotibial band syndrome of right side - Discussed options for treatment including stretching, PT or referral to sports medicine. He opted for stretching first. Handout given  - Follow up as needed  Shirline Frees, NP

## 2023-05-23 NOTE — Patient Instructions (Signed)
 Health Maintenance Due  Topic Date Due   OPHTHALMOLOGY EXAM  Never done   Diabetic kidney evaluation - Urine ACR  Never done   Hepatitis C Screening  Never done   Pneumococcal Vaccine 23-54 Years old (1 of 2 - PCV) Never done   Zoster Vaccines- Shingrix (1 of 2) Never done   COVID-19 Vaccine (3 - 2024-25 season) 10/14/2022   HEMOGLOBIN A1C  05/17/2023       12/31/2022    3:59 PM 08/30/2022    2:24 PM 07/12/2022   10:29 AM  Depression screen PHQ 2/9  Decreased Interest 0 1 2  Down, Depressed, Hopeless 0 1 1  PHQ - 2 Score 0 2 3  Altered sleeping 0 0 1  Tired, decreased energy 0 1 1  Change in appetite 2  1  Feeling bad or failure about yourself  0 2 1  Trouble concentrating 0 1 0  Moving slowly or fidgety/restless 0 0 0  Suicidal thoughts 0 0 0  PHQ-9 Score 2 6 7   Difficult doing work/chores Not difficult at all Somewhat difficult

## 2023-07-16 ENCOUNTER — Other Ambulatory Visit: Payer: Self-pay | Admitting: Adult Health

## 2023-07-16 DIAGNOSIS — Z7985 Long-term (current) use of injectable non-insulin antidiabetic drugs: Secondary | ICD-10-CM

## 2023-08-05 ENCOUNTER — Telehealth: Payer: Self-pay

## 2023-08-05 ENCOUNTER — Telehealth: Payer: Self-pay | Admitting: Adult Health

## 2023-08-05 ENCOUNTER — Encounter: Payer: Self-pay | Admitting: Adult Health

## 2023-08-05 NOTE — Telephone Encounter (Signed)
 Pharmacy Patient Advocate Encounter   Received notification from CoverMyMeds that prior authorization for Mounjaro  5MG /0.5ML auto-injectors is required/requested.   Insurance verification completed.   The patient is insured through [facility] .   Per test claim: PA required; PA submitted to above mentioned insurance via CoverMyMeds Key/confirmation #/EOC [id number] Status is pending

## 2023-08-05 NOTE — Telephone Encounter (Unsigned)
 Copied from CRM [id number]. Topic: Referral - Prior Authorization Question >> Aug 05, 2023 11:02 AM Lavanda D wrote: Reason for CRM: Patient is calling for status update of Prior Authorization due to change of insurance. Patient is confirming that the authorization states that he does have diabetes. 2 weeks out of Mounjaro  @ [facility] on [address].

## 2023-08-06 ENCOUNTER — Other Ambulatory Visit (HOSPITAL_COMMUNITY): Payer: Self-pay

## 2023-08-06 ENCOUNTER — Other Ambulatory Visit: Payer: Self-pay | Admitting: Adult Health

## 2023-08-06 DIAGNOSIS — Z7985 Long-term (current) use of injectable non-insulin antidiabetic drugs: Secondary | ICD-10-CM

## 2023-08-06 DIAGNOSIS — E1169 Type 2 diabetes mellitus with other specified complication: Secondary | ICD-10-CM

## 2023-08-06 MED ORDER — MOUNJARO 5 MG/0.5ML ~~LOC~~ SOAJ: 5.0000 mg | SUBCUTANEOUS | 0 refills | Status: DC

## 2023-08-06 NOTE — Telephone Encounter (Signed)
**Note De-identified  [place] Obfuscation** Please advise 

## 2023-08-06 NOTE — Telephone Encounter (Signed)
 Left message to return phone call.

## 2023-08-07 ENCOUNTER — Other Ambulatory Visit (HOSPITAL_COMMUNITY): Payer: Self-pay

## 2023-08-07 NOTE — Telephone Encounter (Signed)
 Pharmacy Patient Advocate Encounter  Received notification from [facility] that Prior Authorization for  Mounjaro  5MG /0.5ML auto-injectors has been APPROVED from 08/04/2023 to 08/03/2024. Unable to obtain price due to refill too soon rejection, last fill date 08/06/2023 next available fill date07/15/2025

## 2023-08-07 NOTE — Telephone Encounter (Signed)
 Patient notified of update  and verbalized understanding.

## 2023-08-07 NOTE — Telephone Encounter (Signed)
 Justin Norman ([name]) Rx #: [id number] Mounjaro  5MG /0.5ML auto-injectors Form [employer] Electronic PA Form [id number] NCPDP) Created 2 days ago Sent to Plan 2 days ago Plan Response 2 days ago Submit Clinical Questions 2 days ago Determination Favorable 2 days ago Message from Plan Your PA request has been approved. Additional information will be provided in the approval communication. . Authorization Expiration Date: August 03, 2024.

## 2023-08-27 ENCOUNTER — Other Ambulatory Visit: Payer: Self-pay | Admitting: Orthopedic Surgery

## 2023-08-28 NOTE — Progress Notes (Signed)
 Sent message, via epic in basket, requesting orders in epic from Careers adviser.

## 2023-09-03 ENCOUNTER — Encounter (HOSPITAL_COMMUNITY): Payer: Self-pay

## 2023-09-03 NOTE — Patient Instructions (Signed)
 SURGICAL WAITING ROOM VISITATION  Patients having surgery or a procedure may have no more than 2 support people in the waiting area - these visitors may rotate.    Children under the age of 29 must have an adult with them who is not the patient.  Visitors with respiratory illnesses are discouraged from visiting and should remain at home.  If the patient needs to stay at the hospital during part of their recovery, the visitor guidelines for inpatient rooms apply. Pre-op nurse will coordinate an appropriate time for 1 support person to accompany patient in pre-op.  This support person may not rotate.    Please refer to the [facility] website for the visitor guidelines for Inpatients (after your surgery is over and you are in a regular room).       Your procedure is scheduled on: 09-05-23    Report to [facility] Main Entrance    Report to admitting at      0900   AM   Call this number if you have problems the morning of surgery [phone]   Do not eat food :After Midnight.   After Midnight you may have the following liquids until _0830_____ AM/  DAY OF SURGERY  then nothing by mouth  Water  Non-Citrus Juices (without pulp, NO RED-Apple, White grape, White cranberry) Black Coffee (NO MILK/CREAM OR CREAMERS, sugar ok)  Clear Tea (NO MILK/CREAM OR CREAMERS, sugar ok) regular and decaf                             Plain Jell-O (NO RED)                                           Fruit ices (not with fruit pulp, NO RED)                                     Popsicles (NO RED)                                                               Sports drinks like Gatorade (NO RED)                    The day of surgery:  Drink ONE (1) Pre-Surgery  G2  BY 0830  AM the morning of surgery. Drink in one sitting. Do not sip.  This drink was given to you during your hospital  pre-op appointment visit. Nothing else to drink after completing the  Pre-Surgery  G2.          If you have  questions, please contact your surgeon's office.   FOLLOW ANY ADDITIONAL PRE OP INSTRUCTIONS YOU RECEIVED FROM YOUR SURGEON'S OFFICE!!!     Oral Hygiene is also important to reduce your risk of infection.                                    Remember - BRUSH YOUR TEETH THE MORNING OF SURGERY  WITH YOUR REGULAR TOOTHPASTE  DENTURES WILL BE REMOVED PRIOR TO SURGERY PLEASE DO NOT APPLY Poly grip OR ADHESIVES!!!   Do NOT smoke after Midnight   Stop all vitamins and herbal supplements 7 days before surgery.   Take these medicines the morning of surgery with A SIP OF WATER : amlodipine   DO NOT TAKE ANY ORAL DIABETIC MEDICATIONS DAY OF YOUR SURGERY  MOUNJARO  HOLD 1 WEEK prior to surgery  Bring CPAP mask and tubing day of surgery.                              You may not have any metal on your body including hair pins, jewelry, and body piercing             Do not wear lotions, powders, perfumes/cologne, or deodorant               Men may shave face and neck.   Do not bring valuables to the hospital. [facility] IS NOT             RESPONSIBLE   FOR VALUABLES.   Contacts, glasses, dentures or bridgework may not be worn into surgery.   Bring small overnight bag day of surgery.   DO NOT BRING YOUR HOME MEDICATIONS TO THE HOSPITAL. PHARMACY WILL DISPENSE MEDICATIONS LISTED ON YOUR MEDICATION LIST TO YOU DURING YOUR ADMISSION IN THE HOSPITAL!    Patients discharged on the day of surgery will not be allowed to drive home.  Someone NEEDS to stay with you for the first 24 hours after anesthesia.   Special Instructions: Bring a copy of your healthcare power of attorney and living will documents the day of surgery if you haven't scanned them before.              Please read over the following fact sheets you were given: IF YOU HAVE QUESTIONS ABOUT YOUR PRE-OP INSTRUCTIONS PLEASE CALL [phone].    If you test positive for Covid or have been in contact with anyone that has tested positive in  the last 10 days please notify you surgeon.    [place] - Preparing for Surgery Before surgery, you can play an important role.  Because skin is not sterile, your skin needs to be as free of germs as possible.  You can reduce the number of germs on your skin by washing with CHG (chlorahexidine gluconate) soap before surgery.  CHG is an antiseptic cleaner which kills germs and bonds with the skin to continue killing germs even after washing. Please DO NOT use if you have an allergy to CHG or antibacterial soaps.  If your skin becomes reddened/irritated stop using the CHG and inform your nurse when you arrive at Short Stay. Do not shave (including legs and underarms) for at least 48 hours prior to the first CHG shower.  You may shave your face/neck. Please follow these instructions carefully:  1.  Shower with CHG Soap the night before surgery and the  morning of Surgery.  2.  If you choose to wash your hair, wash your hair first as usual with your  normal  shampoo.  3.  After you shampoo, rinse your hair and body thoroughly to remove the  shampoo.                            4.  Use CHG as you would any other  liquid soap.  You can apply chg directly  to the skin and wash                       Gently with a scrungie or clean washcloth.  5.  Apply the CHG Soap to your body ONLY FROM THE NECK DOWN.   Do not use on face/ open                           Wound or open sores. Avoid contact with eyes, ears mouth and genitals (private parts).                       Wash face,  Genitals (private parts) with your normal soap.             6.  Wash thoroughly, paying special attention to the area where your surgery  will be performed.  7.  Thoroughly rinse your body with warm water  from the neck down.  8.  DO NOT shower/wash with your normal soap after using and rinsing off  the CHG Soap.                9.  Pat yourself dry with a clean towel.            10.  Wear clean pajamas.            11.  Place clean  sheets on your bed the night of your first shower and do not  sleep with pets. Day of Surgery : Do not apply any lotions/deodorants the morning of surgery.  Please wear clean clothes to the hospital/surgery center.  FAILURE TO FOLLOW THESE INSTRUCTIONS MAY RESULT IN THE CANCELLATION OF YOUR SURGERY PATIENT SIGNATURE_________________________________  NURSE SIGNATURE__________________________________  ________________________________________________________________________

## 2023-09-03 NOTE — Progress Notes (Addendum)
 PCP - Darleene Shape, NP LOV 05-23-23 epic Cardiologist - no  PPM/ICD -  Device Orders -  Rep Notified -   Chest x-ray - 01-29-23 epic EKG - preop Stress Test -  ECHO -  Cardiac Cath -   Sleep Study -  CPAP - YES  Fasting Blood Sugar - 95 Checks Blood Sugar ___occasional __ times a day  Blood Thinner Instructions:n/a Aspirin Instructions:n/a  ERAS Protcol - PRE-SURGERY G2-    MOUNJARO  hold 1 week last dose--08-25-23   COVID vaccine -yes  Activity--Able to climb a flight of stairs with no SOB or CP  Anesthesia review: OSA ,HTN,DM2  BP elevated at preop Harlene Ward PA-C aware at preop . Pt. Aware of risk of cancellation  Diastolic > 100 . Pt advised to take BP med today. And his amlodipine  before surgery.   Patient denies shortness of breath, fever, cough and chest pain at PAT appointment   All instructions explained to the patient, with a verbal understanding of the material. Patient agrees to go over the instructions while at home for a better understanding. Patient also instructed to self quarantine after being tested for COVID-19. The opportunity to ask questions was provided.

## 2023-09-04 ENCOUNTER — Encounter (HOSPITAL_COMMUNITY)
Admission: RE | Admit: 2023-09-04 | Discharge: 2023-09-04 | Disposition: A | Discharge status: Home / Self Care | Payer: Self-pay | Source: Ambulatory Visit | Attending: Orthopedic Surgery | Admitting: Orthopedic Surgery

## 2023-09-04 ENCOUNTER — Other Ambulatory Visit: Payer: Self-pay

## 2023-09-04 ENCOUNTER — Encounter (HOSPITAL_COMMUNITY): Payer: Self-pay

## 2023-09-04 VITALS — BP 167/111 | HR 57 | Temp 98.1°F | Resp 16 | Ht 74.0 in | Wt 326.4 lb

## 2023-09-04 DIAGNOSIS — E1169 Type 2 diabetes mellitus with other specified complication: Secondary | ICD-10-CM | POA: Diagnosis not present

## 2023-09-04 DIAGNOSIS — Z01810 Encounter for preprocedural cardiovascular examination: Secondary | ICD-10-CM | POA: Diagnosis present

## 2023-09-04 DIAGNOSIS — Z01812 Encounter for preprocedural laboratory examination: Secondary | ICD-10-CM | POA: Diagnosis present

## 2023-09-04 DIAGNOSIS — Z01818 Encounter for other preprocedural examination: Secondary | ICD-10-CM | POA: Insufficient documentation

## 2023-09-04 HISTORY — DX: Type 2 diabetes mellitus without complications: E11.9

## 2023-09-04 HISTORY — DX: Unspecified osteoarthritis, unspecified site: M19.90

## 2023-09-04 HISTORY — DX: Pneumonia, unspecified organism: J18.9

## 2023-09-04 HISTORY — DX: Personal history of urinary calculi: Z87.442

## 2023-09-04 LAB — CBC
HCT: 47.3 % (ref 39.0–52.0)
Hemoglobin: 15.3 g/dL (ref 13.0–17.0)
MCH: 30.1 pg (ref 26.0–34.0)
MCHC: 32.3 g/dL (ref 30.0–36.0)
MCV: 93.1 fL (ref 80.0–100.0)
Platelets: 186 K/uL (ref 150–400)
RBC: 5.08 MIL/uL (ref 4.22–5.81)
RDW: 13.7 % (ref 11.5–15.5)
WBC: 5.1 K/uL (ref 4.0–10.5)
nRBC: 0 % (ref 0.0–0.2)

## 2023-09-04 LAB — BASIC METABOLIC PANEL WITH GFR
Anion gap: 10 (ref 5–15)
BUN: 19 mg/dL (ref 6–20)
CO2: 24 mmol/L (ref 22–32)
Calcium: 9.3 mg/dL (ref 8.9–10.3)
Chloride: 105 mmol/L (ref 98–111)
Creatinine, Ser: 1.05 mg/dL (ref 0.61–1.24)
GFR, Estimated: >60 mL/min (ref >60)
Glucose, Bld: 92 mg/dL (ref 70–99)
Potassium: 4 mmol/L (ref 3.5–5.1)
Sodium: 139 mmol/L (ref 135–145)

## 2023-09-04 LAB — GLUCOSE, CAPILLARY: Glucose-Capillary: 95 mg/dL (ref 70–99)

## 2023-09-04 LAB — HEMOGLOBIN A1C
Hgb A1c MFr Bld: 5 % (ref 4.8–5.6)
Mean Plasma Glucose: 96.8 mg/dL

## 2023-09-05 ENCOUNTER — Ambulatory Visit (HOSPITAL_COMMUNITY): Admitting: Medical

## 2023-09-05 ENCOUNTER — Ambulatory Visit (HOSPITAL_COMMUNITY): Admitting: Anesthesiology

## 2023-09-05 ENCOUNTER — Other Ambulatory Visit: Payer: Self-pay

## 2023-09-05 ENCOUNTER — Encounter (HOSPITAL_COMMUNITY): Payer: Self-pay | Admitting: Orthopedic Surgery

## 2023-09-05 ENCOUNTER — Encounter (HOSPITAL_COMMUNITY)
Admission: RE | Disposition: A | Discharge status: Home / Self Care | Payer: Self-pay | Source: Home / Self Care | Attending: Orthopedic Surgery

## 2023-09-05 ENCOUNTER — Ambulatory Visit (HOSPITAL_COMMUNITY)
Admission: RE | Admit: 2023-09-05 | Discharge: 2023-09-05 | Disposition: A | Discharge status: Home / Self Care | Attending: Orthopedic Surgery | Admitting: Orthopedic Surgery

## 2023-09-05 DIAGNOSIS — Z7985 Long-term (current) use of injectable non-insulin antidiabetic drugs: Secondary | ICD-10-CM | POA: Diagnosis not present

## 2023-09-05 DIAGNOSIS — I10 Essential (primary) hypertension: Secondary | ICD-10-CM | POA: Insufficient documentation

## 2023-09-05 DIAGNOSIS — K219 Gastro-esophageal reflux disease without esophagitis: Secondary | ICD-10-CM | POA: Insufficient documentation

## 2023-09-05 DIAGNOSIS — X58XXXA Exposure to other specified factors, initial encounter: Secondary | ICD-10-CM | POA: Insufficient documentation

## 2023-09-05 DIAGNOSIS — S46211A Strain of muscle, fascia and tendon of other parts of biceps, right arm, initial encounter: Secondary | ICD-10-CM

## 2023-09-05 DIAGNOSIS — N1832 Chronic kidney disease, stage 3b: Secondary | ICD-10-CM

## 2023-09-05 DIAGNOSIS — I129 Hypertensive chronic kidney disease with stage 1 through stage 4 chronic kidney disease, or unspecified chronic kidney disease: Secondary | ICD-10-CM

## 2023-09-05 DIAGNOSIS — E119 Type 2 diabetes mellitus without complications: Secondary | ICD-10-CM | POA: Diagnosis not present

## 2023-09-05 DIAGNOSIS — F17210 Nicotine dependence, cigarettes, uncomplicated: Secondary | ICD-10-CM

## 2023-09-05 DIAGNOSIS — G4730 Sleep apnea, unspecified: Secondary | ICD-10-CM | POA: Insufficient documentation

## 2023-09-05 DIAGNOSIS — E1169 Type 2 diabetes mellitus with other specified complication: Secondary | ICD-10-CM

## 2023-09-05 DIAGNOSIS — Z79899 Other long term (current) drug therapy: Secondary | ICD-10-CM | POA: Insufficient documentation

## 2023-09-05 HISTORY — PX: DISTAL BICEPS TENDON REPAIR: SHX1461

## 2023-09-05 LAB — GLUCOSE, CAPILLARY: Glucose-Capillary: 103 mg/dL — ABNORMAL HIGH (ref 70–99)

## 2023-09-05 SURGERY — REPAIR, TENDON, BICEPS, DISTAL
Anesthesia: General | Laterality: Right

## 2023-09-05 MED ORDER — DEXAMETHASONE SODIUM PHOSPHATE 10 MG/ML IJ SOLN
INTRAMUSCULAR | Status: DC | PRN
  Administered 2023-09-05: 5 mg via INTRAVENOUS

## 2023-09-05 MED ORDER — PROPOFOL 10 MG/ML IV BOLUS
INTRAVENOUS | Status: AC
  Filled 2023-09-05: qty 20

## 2023-09-05 MED ORDER — INSULIN ASPART 100 UNIT/ML IJ SOLN: 0.0000 [IU] | INTRAMUSCULAR | Status: DC | PRN

## 2023-09-05 MED ORDER — OXYCODONE HCL 5 MG PO TABS: 5.0000 mg | ORAL_TABLET | ORAL | 0 refills | Status: DC | PRN

## 2023-09-05 MED ORDER — ONDANSETRON HCL 4 MG/2ML IJ SOLN
INTRAMUSCULAR | Status: DC | PRN
  Administered 2023-09-05: 4 mg via INTRAVENOUS

## 2023-09-05 MED ORDER — METHOCARBAMOL 500 MG PO TABS: 500.0000 mg | ORAL_TABLET | Freq: Four times a day (QID) | ORAL | 0 refills | Status: AC | PRN

## 2023-09-05 MED ORDER — METHOCARBAMOL 1000 MG/10ML IJ SOLN: 500.0000 mg | Freq: Four times a day (QID) | INTRAMUSCULAR | Status: DC | PRN

## 2023-09-05 MED ORDER — OXYCODONE HCL 5 MG PO TABS
5.0000 mg | ORAL_TABLET | Freq: Once | ORAL | Status: AC | PRN
  Administered 2023-09-05: 5 mg via ORAL

## 2023-09-05 MED ORDER — ONDANSETRON HCL 4 MG/2ML IJ SOLN
INTRAMUSCULAR | Status: AC
  Filled 2023-09-05: qty 2

## 2023-09-05 MED ORDER — METHOCARBAMOL 500 MG PO TABS
500.0000 mg | ORAL_TABLET | Freq: Four times a day (QID) | ORAL | Status: DC | PRN
  Administered 2023-09-05: 500 mg via ORAL

## 2023-09-05 MED ORDER — LIDOCAINE HCL (CARDIAC) PF 100 MG/5ML IV SOSY
PREFILLED_SYRINGE | INTRAVENOUS | Status: DC | PRN
  Administered 2023-09-05: 100 mg via INTRAVENOUS

## 2023-09-05 MED ORDER — CHLORHEXIDINE GLUCONATE 0.12 % MT SOLN
15.0000 mL | Freq: Once | OROMUCOSAL | Status: AC
  Administered 2023-09-05: 15 mL via OROMUCOSAL

## 2023-09-05 MED ORDER — FENTANYL CITRATE (PF) 100 MCG/2ML IJ SOLN
INTRAMUSCULAR | Status: AC
  Filled 2023-09-05: qty 2

## 2023-09-05 MED ORDER — MIDAZOLAM HCL 2 MG/2ML IJ SOLN: 1.0000 mg | INTRAMUSCULAR | Status: DC

## 2023-09-05 MED ORDER — MIDAZOLAM HCL 2 MG/2ML IJ SOLN
1.0000 mg | INTRAMUSCULAR | Status: DC
  Administered 2023-09-05: 2 mg via INTRAVENOUS
  Filled 2023-09-05: qty 2

## 2023-09-05 MED ORDER — METHOCARBAMOL 500 MG PO TABS
ORAL_TABLET | ORAL | Status: AC
  Filled 2023-09-05: qty 1

## 2023-09-05 MED ORDER — FENTANYL CITRATE PF 50 MCG/ML IJ SOSY
25.0000 ug | PREFILLED_SYRINGE | INTRAMUSCULAR | Status: DC | PRN
  Administered 2023-09-05 (×2): 50 ug via INTRAVENOUS

## 2023-09-05 MED ORDER — DEXAMETHASONE SODIUM PHOSPHATE 10 MG/ML IJ SOLN
INTRAMUSCULAR | Status: AC
  Filled 2023-09-05: qty 1

## 2023-09-05 MED ORDER — VANCOMYCIN HCL 1500 MG/300ML IV SOLN
1500.0000 mg | INTRAVENOUS | Status: AC
  Administered 2023-09-05: 1500 mg via INTRAVENOUS
  Filled 2023-09-05: qty 300

## 2023-09-05 MED ORDER — ORAL CARE MOUTH RINSE: 15.0000 mL | Freq: Once | OROMUCOSAL | Status: AC

## 2023-09-05 MED ORDER — ONDANSETRON HCL 4 MG/2ML IJ SOLN: 4.0000 mg | Freq: Once | INTRAMUSCULAR | Status: DC | PRN

## 2023-09-05 MED ORDER — PROPOFOL 10 MG/ML IV BOLUS
INTRAVENOUS | Status: DC | PRN
  Administered 2023-09-05: 200 mg via INTRAVENOUS

## 2023-09-05 MED ORDER — 0.9 % SODIUM CHLORIDE (POUR BTL) OPTIME
TOPICAL | Status: DC | PRN
Start: 2023-09-05 — End: 2023-09-05
  Administered 2023-09-05: 1000 mL

## 2023-09-05 MED ORDER — FENTANYL CITRATE PF 50 MCG/ML IJ SOSY: 50.0000 ug | PREFILLED_SYRINGE | INTRAMUSCULAR | Status: DC

## 2023-09-05 MED ORDER — LIDOCAINE HCL (PF) 2 % IJ SOLN
INTRAMUSCULAR | Status: AC
  Filled 2023-09-05: qty 5

## 2023-09-05 MED ORDER — FENTANYL CITRATE PF 50 MCG/ML IJ SOSY
PREFILLED_SYRINGE | INTRAMUSCULAR | Status: AC
Start: 2023-09-05 — End: 2023-09-05
  Filled 2023-09-05: qty 2

## 2023-09-05 MED ORDER — BUPIVACAINE HCL (PF) 0.25 % IJ SOLN
INTRAMUSCULAR | Status: DC | PRN
  Administered 2023-09-05: 25 mL via PERINEURAL

## 2023-09-05 MED ORDER — ACETAMINOPHEN 10 MG/ML IV SOLN: 1000.0000 mg | Freq: Once | INTRAVENOUS | Status: DC | PRN

## 2023-09-05 MED ORDER — ACETAMINOPHEN 500 MG PO TABS
1000.0000 mg | ORAL_TABLET | Freq: Once | ORAL | Status: AC
  Administered 2023-09-05: 1000 mg via ORAL
  Filled 2023-09-05: qty 2

## 2023-09-05 MED ORDER — MELOXICAM 15 MG PO TABS: 15.0000 mg | ORAL_TABLET | Freq: Every day | ORAL | 0 refills | Status: AC

## 2023-09-05 MED ORDER — LEVOFLOXACIN IN D5W 500 MG/100ML IV SOLN
500.0000 mg | INTRAVENOUS | Status: AC
  Administered 2023-09-05: 500 mg via INTRAVENOUS
  Filled 2023-09-05: qty 100

## 2023-09-05 MED ORDER — OXYCODONE HCL 5 MG PO TABS
ORAL_TABLET | ORAL | Status: AC
  Filled 2023-09-05: qty 1

## 2023-09-05 MED ORDER — LACTATED RINGERS IV SOLN: INTRAVENOUS | Status: DC

## 2023-09-05 MED ORDER — FENTANYL CITRATE (PF) 100 MCG/2ML IJ SOLN
INTRAMUSCULAR | Status: DC | PRN
  Administered 2023-09-05 (×2): 25 ug via INTRAVENOUS
  Administered 2023-09-05: 50 ug via INTRAVENOUS

## 2023-09-05 MED ORDER — POVIDONE-IODINE 7.5 % EX SOLN: Freq: Once | CUTANEOUS | Status: DC

## 2023-09-05 MED ORDER — OXYCODONE HCL 5 MG/5ML PO SOLN: 5.0000 mg | Freq: Once | ORAL | Status: AC | PRN

## 2023-09-05 SURGICAL SUPPLY — 62 items
BAG COUNTER SPONGE SURGICOUNT (BAG) ×2 IMPLANT
BIT DRILL PIN BICEPSBTTN 3.2MM (BIT) IMPLANT
BLADE SURG 15 STRL LF DISP TIS (BLADE) ×2 IMPLANT
BNDG COHESIVE 4X5 TAN STRL LF (GAUZE/BANDAGES/DRESSINGS) ×2 IMPLANT
BNDG ELASTIC 4INX 5YD STR LF (GAUZE/BANDAGES/DRESSINGS) ×4 IMPLANT
BNDG ELASTIC 6INX 5YD STR LF (GAUZE/BANDAGES/DRESSINGS) IMPLANT
BNDG ESMARK 4X9 LF (GAUZE/BANDAGES/DRESSINGS) IMPLANT
BUTTON DISTAL BICEPS (Orthopedic Implant) IMPLANT
CHLORAPREP W/TINT 26 (MISCELLANEOUS) ×2 IMPLANT
CORD BIPOLAR FORCEPS 12FT (ELECTRODE) ×2 IMPLANT
COVER BACK TABLE 60X90IN (DRAPES) ×2 IMPLANT
CUFF TOURN SGL QUICK 18X4 (TOURNIQUET CUFF) IMPLANT
CUFF TRNQT CYL 24X4X16.5-23 (TOURNIQUET CUFF) IMPLANT
DRAPE C-ARM 42X120 X-RAY (DRAPES) ×2 IMPLANT
DRAPE EXTREMITY T 121X128X90 (DISPOSABLE) ×2 IMPLANT
DRAPE IMP U-DRAPE 54X76 (DRAPES) ×2 IMPLANT
DRAPE SURG 17X23 STRL (DRAPES) ×2 IMPLANT
DRAPE U-SHAPE 47X51 STRL (DRAPES) IMPLANT
ELECT REM PT RETURN 15FT ADLT (MISCELLANEOUS) ×2 IMPLANT
GAUZE SPONGE 4X4 12PLY STRL (GAUZE/BANDAGES/DRESSINGS) ×2 IMPLANT
GLOVE BIO SURGEON STRL SZ7 (GLOVE) ×2 IMPLANT
GLOVE BIO SURGEON STRL SZ7.5 (GLOVE) ×2 IMPLANT
GLOVE BIOGEL PI IND STRL 6.5 (GLOVE) ×2 IMPLANT
GLOVE BIOGEL PI IND STRL 7.0 (GLOVE) ×2 IMPLANT
GLOVE BIOGEL PI IND STRL 8 (GLOVE) ×2 IMPLANT
GLOVE SURG POLYISO LF SZ6.5 (GLOVE) ×2 IMPLANT
GOWN STRL REUS W/ TWL LRG LVL3 (GOWN DISPOSABLE) ×4 IMPLANT
GOWN STRL REUS W/ TWL XL LVL3 (GOWN DISPOSABLE) ×4 IMPLANT
INSERTER BUTTON (SYSTAGENIX WOUND MANAGEMENT) IMPLANT
KIT BASIN OR (CUSTOM PROCEDURE TRAY) ×2 IMPLANT
KIT TURNOVER KIT A (KITS) ×2 IMPLANT
NDL HYPO 25X1 1.5 SAFETY (NEEDLE) IMPLANT
NDL TAPERED W/ NITINOL LOOP (MISCELLANEOUS) IMPLANT
NEEDLE HYPO 25X1 1.5 SAFETY (NEEDLE) IMPLANT
NEEDLE TAPERED W/ NITINOL LOOP (MISCELLANEOUS) ×1 IMPLANT
NS IRRIG 1000ML POUR BTL (IV SOLUTION) ×2 IMPLANT
PAD CAST 4YDX4 CTTN HI CHSV (CAST SUPPLIES) ×4 IMPLANT
PENCIL SMOKE EVACUATOR (MISCELLANEOUS) ×2 IMPLANT
SLING ARM FOAM STRAP LRG (SOFTGOODS) IMPLANT
SLING ARM FOAM STRAP XLG (SOFTGOODS) IMPLANT
SPIKE FLUID TRANSFER (MISCELLANEOUS) IMPLANT
SPLINT PLASTER CAST FAST 5X30 (CAST SUPPLIES) IMPLANT
SPLINT PLASTER CAST XFAST 4X15 (CAST SUPPLIES) IMPLANT
SPLINT PLASTER CAST XFAST 5X30 (CAST SUPPLIES) ×20 IMPLANT
SPONGE T-LAP 4X18 ~~LOC~~+RFID (SPONGE) ×2 IMPLANT
STOCKINETTE 4X48 STRL (DRAPES) ×2 IMPLANT
STRIP CLOSURE SKIN 1/2X4 (GAUZE/BANDAGES/DRESSINGS) ×2 IMPLANT
SUCTION TUBE FRAZIER 10FR DISP (SUCTIONS) ×2 IMPLANT
SUT MNCRL AB 4-0 PS2 18 (SUTURE) IMPLANT
SUT SILK 3-0 18XBRD TIE BLK (SUTURE) IMPLANT
SUT VIC AB 0 CT1 27XBRD ANBCTR (SUTURE) IMPLANT
SUT VIC AB 2-0 SH 27XBRD (SUTURE) IMPLANT
SUT VIC AB 3-0 SH 27X BRD (SUTURE) ×2 IMPLANT
SUT VICRYL 3 0 BR 18 UND (SUTURE) IMPLANT
SUTURE FIBERWR #2 38 T-5 BLUE (SUTURE) IMPLANT
SUTURE TAPE 1.3 40 TPR END (SUTURE) ×2 IMPLANT
SUTURE TAPE 1.3 FIBERLOP 20 ST (SUTURE) IMPLANT
SYR BULB EAR ULCER 3OZ GRN STR (SYRINGE) ×2 IMPLANT
SYR CONTROL 10ML LL (SYRINGE) IMPLANT
TUBING CONNECTING 10 (TUBING) ×2 IMPLANT
UNDERPAD 30X36 HEAVY ABSORB (UNDERPADS AND DIAPERS) ×2 IMPLANT
YANKAUER SUCT BULB TIP NO VENT (SUCTIONS) ×2 IMPLANT

## 2023-09-05 NOTE — H&P (Signed)
 Justin Norman is an 53 y.o. male.   Chief Complaint: R elbow injury HPI: s/p R elbow injury with complete distal biceps rupture  Past Medical History:  Diagnosis Date   Anxiety    during a divorce   Arthritis    Diabetes mellitus without complication (HCC)    type 2   Dry skin    ERECTILE DYSFUNCTION 03/03/2007   GERD (gastroesophageal reflux disease)    not taking meds   Headache    headaches   History of kidney stones    HYPERTENSION 07/03/2007   Obesities, morbid (HCC)    Pneumonia    Sleep apnea 02/13/2011   Tear of medial meniscus of right knee 11/20/2013    Past Surgical History:  Procedure Laterality Date   KNEE ARTHROSCOPY Right 11/20/2013   Procedure: RIGHT KNEE ARTHROSCOPY WITH MEDIAL MENISCECTOMY ;  Surgeon: Fonda SHAUNNA Olmsted, MD;  Location: [facility] OR;  Service: Orthopedics;  Laterality: Right;   MASS EXCISION Right 09/18/2019   Procedure: EXCISION MASS RIGHT WRIST;  Surgeon: Murrell Drivers, MD;  Location: [facility];  Service: Orthopedics;  Laterality: Right;  GENERAL OR AXILLARY BLOCK   vocal cord abscess surgery      Family History  Problem Relation Age of Onset   Hypertension Mother    Arthritis Mother    Dementia Mother    Hypertension Sister    Arrhythmia Sister        pace maker   Diabetes Sister    Kidney disease Sister    Scoliosis Daughter    Stroke Maternal Grandmother    Diabetes Maternal Grandfather    Social History:  reports that he has been smoking cigarettes. He has a 7.5 pack-year smoking history. He has never used smokeless tobacco. He reports current alcohol use. He reports that he does not use drugs.  Allergies:  Allergies  Allergen Reactions   Keflex  [Cephalexin ]    Metformin  And Related Diarrhea and Nausea Only    Medications Prior to Admission  Medication Sig Dispense Refill   Accu-Chek Softclix Lancets lancets      amLODipine  (NORVASC ) 5 MG tablet Take 1 tablet (5 mg total) by mouth daily. 90 tablet 1   Blood  Glucose Monitoring Suppl (ACCU-CHEK GUIDE) w/Device KIT      Blood Glucose Monitoring Suppl DEVI 1 each by Does not apply route in the morning, at noon, and at bedtime. May substitute to any manufacturer covered by patient's insurance. 1 each 0   lisinopril -hydrochlorothiazide  (ZESTORETIC ) 20-25 MG tablet Take 1 tablet by mouth daily. 90 tablet 1   NON FORMULARY Pt uses a cpap nightly     tirzepatide  (MOUNJARO ) 5 MG/0.5ML Pen Inject 5 mg into the skin once a week. 2 mL 0   albuterol  (VENTOLIN  HFA) 108 (90 Base) MCG/ACT inhaler Inhale 2 puffs into the lungs every 4 (four) hours as needed for wheezing or shortness of breath. (Patient not taking: Reported on 09/03/2023) 1 each 0   atorvastatin  (LIPITOR) 40 MG tablet Take 1 tablet (40 mg total) by mouth daily. (Patient not taking: Reported on 09/03/2023) 90 tablet 1   hydrocortisone  (ANUSOL -HC) 2.5 % rectal cream Place 1 Application rectally 2 (two) times daily. (Patient not taking: Reported on 09/03/2023) 30 g 0   sildenafil  (VIAGRA ) 100 MG tablet Take 1 tablet (100 mg total) by mouth daily as needed for erectile dysfunction. 10 tablet 3    Results for orders placed or performed during the hospital encounter of 09/05/23 (from the past  48 hours)  Glucose, capillary     Status: Abnormal   Collection Time: 09/05/23  9:18 AM  Result Value Ref Range   Glucose-Capillary 103 (H) 70 - 99 mg/dL    Comment: Glucose reference range applies only to samples taken after fasting for at least 8 hours.   Comment 1 Notify RN    Comment 2 Document in Chart    No results found.  Review of Systems  All other systems reviewed and are negative.   Blood pressure (!) 160/92, pulse (!) 54, temperature 98.1 F (36.7 C), temperature source Oral, resp. rate 16, height 6' 2 (1.88 m), weight (!) 148.1 kg, SpO2 98%. Physical Exam Constitutional:      Appearance: He is well-developed.  HENT:     Head: Atraumatic.  Eyes:     Extraocular Movements: Extraocular movements  intact.  Cardiovascular:     Pulses: Normal pulses.  Pulmonary:     Effort: Pulmonary effort is normal.  Musculoskeletal:     Comments: R elbow pain and weakness with biceps testing. NVID.  Skin:    General: Skin is warm and dry.  Neurological:     Mental Status: He is alert and oriented to person, place, and time.  Psychiatric:        Mood and Affect: Mood normal.      Assessment/Plan s/p R elbow injury with complete distal biceps rupture Plan right elbow open distal biceps reinsertion Risks / benefits of surgery discussed Consent on chart  NPO for OR Preop antibiotics    Justin LELON Herring, MD 09/05/2023, 10:21 AM

## 2023-09-05 NOTE — Op Note (Signed)
 Procedure(s): REPAIR, TENDON, BICEPS, DISTAL Procedure Note  Justin Norman male 53 y.o. 09/05/2023  Preoperative diagnosis: Right distal biceps rupture  Postoperative diagnosis: Same  Procedure(s) and Anesthesia Type:    * REPAIR, TENDON, BICEPS, DISTAL - Choice      Surgeon: [name]   Assistants: Jeoffrey Northern PA-C Amber was present and scrubbed throughout the procedure and was essential in positioning, retraction, exposure, and closure)  Anesthesia: General endotracheal anesthesia with preoperative interscalene block given by the attending anesthesiologist    Procedure Detail  REPAIR, TENDON, BICEPS, DISTAL  Estimated Blood Loss:  less than 50 mL         Drains: none  Blood Given: none         Specimens: none        Complications:  * No complications entered in OR log *         Disposition: PACU - hemodynamically stable.         Condition: stable    Procedure:   INDICATIONS FOR SURGERY: The patient suffered a distal biceps rupture. He was indicated for surgical treatment to restore flexion and supination of strength and endurance. He understood risks benefits alternatives to the procedure including but not limited to the risk of nonhealing, or stiffness, damage to neurovascular structures, heterotopic bone formation, and the potential need for future surgery.  OPERATIVE FINDINGS: Complete rupture of the distal biceps tendon. Repaired back through bone tunnels using a Arthrex button device.   DESCRIPTION OF PROCEDURE: The patient was identified in preoperative  holding area where I personally marked the operative site after  verifying site, side, and procedure with the patient. The patient was taken back  to the operating room where general anesthesia was induced without  Complication. The patient did receive preoperative IV antibiotics. The patient was kept in the supine position. The operative extremity was prepped and draped in standard sterile  fashion. A. approximately 4 cm transverse incision was made about a centimeter distal to the common of flexion crease of the elbow. Dissection was carried down through subcutaneous tissues beneath the fascia.  Distally the distal biceps stump was identified and was scarred into surrounding tissues.  These adhesions were lysed.  Any adhesions around the body of the biceps muscle belly were digitally lysed. The distal end of the biceps tendon was freshened sharply and scar tissue was removed. The Arthrex metallic button device was then fixed to the distal end of the tendon using a suture tape and a locking running fashion. The distal tunnel going down to the bare radial tuberosity was identified and developed. Retractors were placed distally taking great care to stay directly on bone. Once the bicipital tuberosity was identified the guidepin was placed unicortically fluoroscopic imaging was used to verify its position on the bicipital tuberosity. The guidepin was then advanced bicortically and the 7 mm reamer was used on the volar cortex and then the dorsal cortex was reamed with the 4.5 mm reamer. the button was flipped on the opposite side of the bone and its position was verified again with fluoroscopic imaging. The wound is copiously irrigated of any bone dust at this point. The sutures were then alternatively tightening to bringing the prepared tendon nicely down into the bone tunnel anchoring securely by using a free needle to pass the suture twice and tied it to itself. This was done under direct visualization. The sutures were then cut.  Final fluoroscopic imaging demonstrated appropriate positioning of the button and tunnels. The  wound was again copiously irrigated with normal saline. The tension of the repair was tested.  There was no tension on the repair. The wound is then closed with 3-0 Vicryl in a deep dermal layer and Steri-Strips for skin closure. A long-arm splint was applied to 90 of flexion  with neutral wrist rotation. Patient was allowed to awaken from general anesthesia transferred to stretcher and taken to the recovery room in stable condition.   POSTOPERATIVE PLAN: The patient will be discharged home today with family.  He will followup in about one week at which point we will get him out of his splint and begin some gentle range of motion exercises. 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

## 2023-09-05 NOTE — Transfer of Care (Signed)
 Immediate Anesthesia Transfer of Care Note  Patient: Justin Norman  Procedure(s) Performed: REPAIR, TENDON, BICEPS, DISTAL (Right)  Patient Location: PACU  Anesthesia Type:General  Level of Consciousness: awake, alert , oriented, and patient cooperative  Airway & Oxygen Therapy: Patient Spontanous Breathing and Patient connected to face mask oxygen  Post-op Assessment: Report given to RN and Post -op Vital signs reviewed and stable  Post vital signs: Reviewed and stable  Last Vitals:  Vitals Value Taken Time  BP 147/88 09/05/23 12:45  Temp    Pulse 62 09/05/23 12:46  Resp 20 09/05/23 12:46  SpO2 98 % 09/05/23 12:46  Vitals shown include unfiled device data.  Last Pain:  Vitals:   09/05/23 0922  TempSrc:   PainSc: 0-No pain         Complications: No notable events documented.

## 2023-09-05 NOTE — Anesthesia Procedure Notes (Addendum)
 Anesthesia Regional Block: Supraclavicular block   Pre-Anesthetic Checklist: , timeout performed,  Correct Patient, Correct Site, Correct Laterality,  Correct Procedure, Correct Position, site marked,  Risks and benefits discussed,  Surgical consent,  Pre-op evaluation,  At surgeon's request and post-op pain management  Laterality: Right  Prep: chloraprep       Needles:  Injection technique: Single-shot  Needle Type: Echogenic Needle     Needle Length: 4cm  Needle Gauge: 25     Additional Needles:   Procedures:,,,, ultrasound used (permanent image in chart),,    Narrative:  Start time: 09/05/2023 10:45 AM End time: 09/05/2023 10:49 AM Injection made incrementally with aspirations every 5 mL.  Performed by: Personally  Anesthesiologist: Boone Fess, MD  Additional Notes: Patient's chart reviewed and they were deemed appropriate candidate for procedure, per surgeon's request. Patient educated about risks, benefits, and alternatives of the block including but not limited to: temporary or permanent nerve damage, hemidiaphragmatic paralysis leading to dyspnea, pneumothorax, bleeding, infection, damage to surround tissues, block failure, local anesthetic toxicity. Patient expressed understanding. A formal time-out was conducted consistent with institution rules.  Monitors were applied, and minimal sedation used (see nursing record). The site was prepped with skin prep and allowed to dry, and sterile gloves were used. A high frequency linear ultrasound probe with probe cover was utilized throughout. Supraclavicular artery visualized, along with the brachial plexus adjacent to it and appeared anatomically normal. Local anesthetic injected around the plexus, and echogenic block needle trajectory was monitored throughout. Aspiration performed every 5ml. Lung and blood vessels were avoided. All injections were performed without resistance and free of blood and paresthesias. The patient tolerated  the procedure well.  Injectate: 25ml 0.25% bupivacaine 

## 2023-09-05 NOTE — Anesthesia Postprocedure Evaluation (Signed)
 Anesthesia Post Note  Patient: Justin Norman  Procedure(s) Performed: REPAIR, TENDON, BICEPS, DISTAL (Right)     Patient location during evaluation: PACU Anesthesia Type: General Level of consciousness: awake and alert Pain management: pain level controlled Vital Signs Assessment: post-procedure vital signs reviewed and stable Respiratory status: spontaneous breathing, nonlabored ventilation, respiratory function stable and patient connected to nasal cannula oxygen Cardiovascular status: blood pressure returned to baseline and stable Postop Assessment: no apparent nausea or vomiting Anesthetic complications: no   No notable events documented.  Last Vitals:  Vitals:   09/05/23 1330 09/05/23 1345  BP: (!) 133/90 135/88  Pulse: (!) 49 (!) 49  Resp: 19 18  Temp:    SpO2: 98% 99%    Last Pain:  Vitals:   09/05/23 1345  TempSrc:   PainSc: 4                  Rome Ade

## 2023-09-05 NOTE — Discharge Instructions (Addendum)
 Discharge Instructions after Distal Biceps Tendon Repair   A splint has been provided for you. Remain in your splint at all times until your follow-up visit.  Use ice on the elbow intermittently over the first 48 hours after surgery. Pain medicine has been prescribed for you.  Use your medicine liberally over the first 48 hours, and then you can begin to taper your use. You may take Extra Strength Tylenol  or Tylenol  only in place of the pain pills.  You may shower after surgery. The dressing CANNOT get wet during your shower. Wrap the dressing with a dry towel and place your arm in a large trash bag.  You may receive meloxicam  to take after surgery. If so, take as per the prescription. You MUST take this medication on a full stomach. If you begin to experience stomach pain, discontinue the Indocin. Otherwise, continue to take it regardless of how your elbow feels. While taking Indocin DO NOT take ANY nonsteroidal anti-inflammatory pain medications: Advil, Motrin, Ibuprofen, Aleve, Naproxen, or Narprosyn.   Please call [phone] during normal business hours or [phone] after hours for any problems. Including the following:  - excessive redness of the incisions - drainage for more than 4 days - fever of more than 101.5 F  *Please note that pain medications will not be refilled after hours or on weekends.

## 2023-09-05 NOTE — Anesthesia Procedure Notes (Signed)
 Procedure Name: LMA Insertion Date/Time: 09/05/2023 11:30 AM  Performed by: Erick Fitz, CRNAPre-anesthesia Checklist: Patient identified, Emergency Drugs available, Suction available, Patient being monitored and Timeout performed Patient Re-evaluated:Patient Re-evaluated prior to induction Oxygen Delivery Method: Circle system utilized Preoxygenation: Pre-oxygenation with 100% oxygen Induction Type: IV induction LMA: LMA inserted and LMA with gastric port inserted Number of attempts: 1 Placement Confirmation: positive ETCO2, CO2 detector and breath sounds checked- equal and bilateral Tube secured with: Tape (secured with pink Hy-tape) Dental Injury: Teeth and Oropharynx as per pre-operative assessment

## 2023-09-05 NOTE — Anesthesia Preprocedure Evaluation (Signed)
 Anesthesia Evaluation  Patient identified by MRN, date of birth, ID band Patient awake    Reviewed: Allergy & Precautions, NPO status , Patient's Chart, lab work & pertinent test results  History of Anesthesia Complications Negative for: history of anesthetic complications  Airway Mallampati: III  TM Distance: >3 FB Neck ROM: Full    Dental no notable dental hx. (+) Teeth Intact   Pulmonary sleep apnea and Continuous Positive Airway Pressure Ventilation , neg COPD, Current Smoker and Patient abstained from smoking.   Pulmonary exam normal breath sounds clear to auscultation       Cardiovascular Exercise Tolerance: Good METShypertension, Pt. on medications (-) CAD and (-) Past MI (-) dysrhythmias  Rhythm:Regular Rate:Normal - Systolic murmurs    Neuro/Psych  Headaches PSYCHIATRIC DISORDERS Anxiety        GI/Hepatic ,GERD  Controlled,,(+)     (-) substance abuse    Endo/Other  diabetes  Class 3 obesityGLP1 agonist held for two weeks. Denies GI symptoms today.  Renal/GU negative Renal ROS     Musculoskeletal   Abdominal  (+) + obese  Peds  Hematology   Anesthesia Other Findings Past Medical History: No date: Anxiety     Comment:  during a divorce No date: Arthritis No date: Diabetes mellitus without complication (HCC)     Comment:  type 2 No date: Dry skin 03/03/2007: ERECTILE DYSFUNCTION No date: GERD (gastroesophageal reflux disease)     Comment:  not taking meds No date: Headache     Comment:  headaches No date: History of kidney stones 07/03/2007: HYPERTENSION No date: Obesities, morbid (HCC) No date: Pneumonia 02/13/2011: Sleep apnea 11/20/2013: Tear of medial meniscus of right knee  Reproductive/Obstetrics                              Anesthesia Physical Anesthesia Plan  ASA: 3  Anesthesia Plan: General   Post-op Pain Management: Tylenol  PO (pre-op)* and Regional  block*   Induction: Intravenous  PONV Risk Score and Plan: 1 and Ondansetron , Dexamethasone  and Midazolam   Airway Management Planned: LMA  Additional Equipment: None  Intra-op Plan:   Post-operative Plan: Extubation in OR  Informed Consent: I have reviewed the patients History and Physical, chart, labs and discussed the procedure including the risks, benefits and alternatives for the proposed anesthesia with the patient or authorized representative who has indicated his/her understanding and acceptance.     Dental advisory given  Plan Discussed with: CRNA and Surgeon  Anesthesia Plan Comments: (Discussed risks of anesthesia with patient, including PONV, sore throat, lip/dental/eye damage. Rare risks discussed as well, such as cardiorespiratory and neurological sequelae, and allergic reactions. Discussed the role of CRNA in patient's perioperative care. Patient understands. Discussed r/b/a of supraclavicular nerve block, including:  - bleeding, infection, nerve damage - pneumothorax - shortness of breath from hemidiaphragmatic paralysis due to phrenic nerve blockade - poor or non functioning block (increased due to higher BMI) - reactions and toxicity to local anesthetic Patient understands. )        Anesthesia Quick Evaluation

## 2023-09-06 ENCOUNTER — Encounter (HOSPITAL_COMMUNITY): Payer: Self-pay | Admitting: Orthopedic Surgery

## 2023-10-07 ENCOUNTER — Other Ambulatory Visit: Payer: Self-pay | Admitting: Adult Health

## 2023-10-07 DIAGNOSIS — Z7985 Long-term (current) use of injectable non-insulin antidiabetic drugs: Secondary | ICD-10-CM

## 2023-10-07 DIAGNOSIS — E1169 Type 2 diabetes mellitus with other specified complication: Secondary | ICD-10-CM

## 2023-10-08 ENCOUNTER — Other Ambulatory Visit: Payer: Self-pay | Admitting: Adult Health

## 2023-10-08 DIAGNOSIS — E1169 Type 2 diabetes mellitus with other specified complication: Secondary | ICD-10-CM

## 2023-10-08 DIAGNOSIS — Z7985 Long-term (current) use of injectable non-insulin antidiabetic drugs: Secondary | ICD-10-CM

## 2023-10-08 NOTE — Telephone Encounter (Unsigned)
 Copied from CRM [id number]. Topic: Clinical - Medication Refill >> Oct 08, 2023  3:28 PM Mercedes MATSU wrote: Medication: tirzepatide  (MOUNJARO ) 5 MG/0.5ML Pen  Has the patient contacted their pharmacy? Yes (Agent: If no, request that the patient contact the pharmacy for the refill. If patient does not wish to contact the pharmacy document the reason why and proceed with request.) (Agent: If yes, when and what did the pharmacy advise?)  This is the patient's preferred pharmacy:  [id number] - [place], [place] - [address] AT [facility] [address]  [place] [address] Phone: [phone] Fax: [phone]   Is this the correct pharmacy for this prescription? Yes If no, delete pharmacy and type the correct one.   Has the prescription been filled recently? Yes  Is the patient out of the medication? Yes  Has the patient been seen for an appointment in the last year OR does the patient have an upcoming appointment? Yes  Can we respond through MyChart? Yes  Agent: Please be advised that Rx refills may take up to 3 business days. We ask that you follow-up with your pharmacy.

## 2023-11-03 ENCOUNTER — Other Ambulatory Visit: Payer: Self-pay | Admitting: Adult Health

## 2023-11-03 DIAGNOSIS — Z7985 Long-term (current) use of injectable non-insulin antidiabetic drugs: Secondary | ICD-10-CM

## 2023-11-03 DIAGNOSIS — E1169 Type 2 diabetes mellitus with other specified complication: Secondary | ICD-10-CM

## 2023-11-29 ENCOUNTER — Ambulatory Visit: Admitting: Adult Health

## 2023-12-01 ENCOUNTER — Other Ambulatory Visit: Payer: Self-pay | Admitting: Adult Health

## 2023-12-01 DIAGNOSIS — E1169 Type 2 diabetes mellitus with other specified complication: Secondary | ICD-10-CM

## 2023-12-01 DIAGNOSIS — Z7985 Long-term (current) use of injectable non-insulin antidiabetic drugs: Secondary | ICD-10-CM

## 2023-12-03 ENCOUNTER — Other Ambulatory Visit: Payer: Self-pay | Admitting: Adult Health

## 2023-12-03 DIAGNOSIS — Z7985 Long-term (current) use of injectable non-insulin antidiabetic drugs: Secondary | ICD-10-CM

## 2023-12-03 DIAGNOSIS — E1169 Type 2 diabetes mellitus with other specified complication: Secondary | ICD-10-CM

## 2023-12-03 NOTE — Telephone Encounter (Signed)
 Copied from CRM [id number]. Topic: Clinical - Medication Refill >> Dec 03, 2023  4:53 PM Viola F wrote: Medication: tirzepatide  (MOUNJARO ) 5 MG/0.5ML Pen [[id number]]  Has the patient contacted their pharmacy? Yes (Agent: If no, request that the patient contact the pharmacy for the refill. If patient does not wish to contact the pharmacy document the reason why and proceed with request.) (Agent: If yes, when and what did the pharmacy advise?)  This is the patient's preferred pharmacy:  [id number] - [place], [place] - [address] AT [facility] [address] [place] [address] Phone: [phone] Fax: [phone]   Is this the correct pharmacy for this prescription? Yes If no, delete pharmacy and type the correct one.   Has the prescription been filled recently? Yes  Is the patient out of the medication? Yes  Has the patient been seen for an appointment in the last year OR does the patient have an upcoming appointment? Yes  Can we respond through MyChart? Yes  Agent: Please be advised that Rx refills may take up to 3 business days. We ask that you follow-up with your pharmacy.

## 2023-12-10 MED ORDER — MOUNJARO 5 MG/0.5ML ~~LOC~~ SOAJ: 5.0000 mg | SUBCUTANEOUS | 0 refills | Status: AC

## 2023-12-20 DIAGNOSIS — M1712 Unilateral primary osteoarthritis, left knee: Secondary | ICD-10-CM | POA: Diagnosis not present

## 2024-01-28 ENCOUNTER — Other Ambulatory Visit: Payer: Self-pay | Admitting: Adult Health

## 2024-01-28 DIAGNOSIS — E1169 Type 2 diabetes mellitus with other specified complication: Secondary | ICD-10-CM

## 2024-01-28 DIAGNOSIS — Z7985 Long-term (current) use of injectable non-insulin antidiabetic drugs: Secondary | ICD-10-CM

## 2024-01-30 ENCOUNTER — Other Ambulatory Visit: Payer: Self-pay | Admitting: Adult Health

## 2024-01-30 DIAGNOSIS — Z7985 Long-term (current) use of injectable non-insulin antidiabetic drugs: Secondary | ICD-10-CM

## 2024-01-30 DIAGNOSIS — E1169 Type 2 diabetes mellitus with other specified complication: Secondary | ICD-10-CM

## 2024-01-30 NOTE — Telephone Encounter (Unsigned)
 Copied from CRM #[id number]. Topic: Clinical - Medication Refill >> Jan 30, 2024  4:44 PM Viola F wrote: Medication: tirzepatide  (MOUNJARO ) 5 MG/0.5ML Pen [[id number]]  Has the patient contacted their pharmacy? Yes (Agent: If no, request that the patient contact the pharmacy for the refill. If patient does not wish to contact the pharmacy document the reason why and proceed with request.) (Agent: If yes, when and what did the pharmacy advise?)  This is the patient's preferred pharmacy:   [id number] - [place], [place] - [address]   Is this the correct pharmacy for this prescription? Yes If no, delete pharmacy and type the correct one.   Has the prescription been filled recently? Yes  Is the patient out of the medication? Yes  Has the patient been seen for an appointment in the last year OR does the patient have an upcoming appointment? Yes  Can we respond through MyChart? Yes  Agent: Please be advised that Rx refills may take up to 3 business days. We ask that you follow-up with your pharmacy.

## 2024-02-11 ENCOUNTER — Ambulatory Visit: Admitting: Adult Health

## 2024-02-11 NOTE — Progress Notes (Deleted)
 "  Subjective:    Patient ID: Justin Norman, male    DOB: 11-01-1970, 53 y.o.   MRN: [id number]  HPI 53 year old male who  has a past medical history of Anxiety, Arthritis, Diabetes mellitus without complication (HCC), Dry skin, ERECTILE DYSFUNCTION (03/03/2007), GERD (gastroesophageal reflux disease), Headache, History of kidney stones, HYPERTENSION (07/03/2007), Obesities, morbid (HCC), Pneumonia, Sleep apnea (02/13/2011), and Tear of medial meniscus of right knee (11/20/2013).  He presents to the office today for follow up regarding DM/HTN/Obesity   DM Type 2 - managed with Glipizide  2.5 mg ER. Metformin  caused GI issues. He has not been checking his blood sugars at home. When he was last seen he was started on Mounjaro  but has not been on this medication for the last three months due to his insurance changing with a change in job. SABRA He has been taking Glipizide  in the meantime. He reports that when he was taking Mounjaro  he was tolerating this medication well. He would like to go back on this medication now that he has insurance.  Lab Results  Component Value Date   HGBA1C 5.0 09/04/2023   HGBA1C 5.6 05/23/2023   HGBA1C 5.6 11/16/2022   HTN - Managed with Norvasc  5 mg daily ( ran out of medication) and Zestoretic  20-25 mg daily. He denies dizziness, lightheadedness, blurred vision or headaches. BP Readings from Last 3 Encounters:  09/05/23 135/88  09/04/23 (!) 167/111  05/23/23 (!) 130/90   Obesity - he has been working on diet through exercise and eating healthier Wt Readings from Last 3 Encounters:  09/05/23 (!) 326 lb 6.4 oz (148.1 kg)  09/04/23 (!) 326 lb 6.4 oz (148.1 kg)  05/23/23 (!) 333 lb (151 kg)     Review of Systems See HPI   Past Medical History:  Diagnosis Date   Anxiety    during a divorce   Arthritis    Diabetes mellitus without complication (HCC)    type 2   Dry skin    ERECTILE DYSFUNCTION 03/03/2007   GERD (gastroesophageal reflux disease)    not  taking meds   Headache    headaches   History of kidney stones    HYPERTENSION 07/03/2007   Obesities, morbid (HCC)    Pneumonia    Sleep apnea 02/13/2011   Tear of medial meniscus of right knee 11/20/2013    Social History   Socioeconomic History   Marital status: Married    Spouse name: Not on file   Number of children: 2   Years of education: Not on file   Highest education level: GED or equivalent  Occupational History    Employer: [employer]  Tobacco Use   Smoking status: Every Day    Current packs/day: 0.50    Average packs/day: 0.5 packs/day for 15.0 years (7.5 ttl pk-yrs)    Types: Cigarettes   Smokeless tobacco: Never  Vaping Use   Vaping status: Never Used  Substance and Sexual Activity   Alcohol use: Yes    Comment: occasional   Drug use: No   Sexual activity: Yes  Other Topics Concern   Not on file  Social History Narrative   Not on file   Social Drivers of Health   Tobacco Use: High Risk (09/05/2023)   Patient History    Smoking Tobacco Use: Every Day    Smokeless Tobacco Use: Never    Passive Exposure: Not on file  Financial Resource Strain: Medium Risk (07/11/2022)   Overall Financial Resource Strain (CARDIA)  Difficulty of Paying Living Expenses: Somewhat hard  Food Insecurity: Food Insecurity Present (07/11/2022)   Hunger Vital Sign    Worried About Running Out of Food in the Last Year: Sometimes true    Ran Out of Food in the Last Year: Sometimes true  Transportation Needs: No Transportation Needs (07/11/2022)   PRAPARE - Administrator, Civil Service (Medical): No    Lack of Transportation (Non-Medical): No  Physical Activity: Unknown (07/11/2022)   Exercise Vital Sign    Days of Exercise per Week: Patient declined    Minutes of Exercise per Session: Not on file  Stress: Stress Concern Present (07/11/2022)   [employer] of Occupational Health - Occupational Stress Questionnaire    Feeling of Stress : Rather much   Social Connections: Unknown (07/11/2022)   Social Connection and Isolation Panel    Frequency of Communication with Friends and Family: Three times a week    Frequency of Social Gatherings with Friends and Family: Once a week    Attends Religious Services: Patient declined    Database Administrator or Organizations: No    Attends Engineer, Structural: Not on file    Marital Status: Married  Catering Manager Violence: Not on file  Depression (PHQ2-9): Low Risk (12/31/2022)   Depression (PHQ2-9)    PHQ-2 Score: 2  Alcohol Screen: Not on file  Housing: Low Risk (07/11/2022)   Housing    Last Housing Risk Score: 0  Utilities: Not on file  Health Literacy: Not on file    Past Surgical History:  Procedure Laterality Date   DISTAL BICEPS TENDON REPAIR Right 09/05/2023   Procedure: REPAIR, TENDON, BICEPS, DISTAL;  Surgeon: Dozier Soulier, MD;  Location: [facility];  Service: Orthopedics;  Laterality: Right;   KNEE ARTHROSCOPY Right 11/20/2013   Procedure: RIGHT KNEE ARTHROSCOPY WITH MEDIAL MENISCECTOMY ;  Surgeon: Fonda SHAUNNA Olmsted, MD;  Location: [facility];  Service: Orthopedics;  Laterality: Right;   MASS EXCISION Right 09/18/2019   Procedure: EXCISION MASS RIGHT WRIST;  Surgeon: Murrell Drivers, MD;  Location: [facility];  Service: Orthopedics;  Laterality: Right;  GENERAL OR AXILLARY BLOCK   vocal cord abscess surgery      Family History  Problem Relation Age of Onset   Hypertension Mother    Arthritis Mother    Dementia Mother    Hypertension Sister    Arrhythmia Sister        pace maker   Diabetes Sister    Kidney disease Sister    Scoliosis Daughter    Stroke Maternal Grandmother    Diabetes Maternal Grandfather     Allergies[1]  Medications Ordered Prior to Encounter[2]  There were no vitals taken for this visit.      Objective:   Physical Exam Vitals and nursing note reviewed.  Constitutional:      Appearance: Normal appearance. He is obese.   Cardiovascular:     Rate and Rhythm: Normal rate and regular rhythm.     Pulses: Normal pulses.     Heart sounds: Normal heart sounds.  Pulmonary:     Effort: Pulmonary effort is normal.     Breath sounds: Normal breath sounds.  Skin:    General: Skin is warm and dry.  Neurological:     General: No focal deficit present.     Mental Status: He is alert and oriented to person, place, and time.  Psychiatric:        Mood and Affect: Mood  normal.        Behavior: Behavior normal.        Thought Content: Thought content normal.        Judgment: Judgment normal.        Assessment & Plan:       [1]  Allergies Allergen Reactions   Keflex  [Cephalexin ]    Metformin  And Related Diarrhea and Nausea Only  [2]  Current Outpatient Medications on File Prior to Visit  Medication Sig Dispense Refill   Accu-Chek Softclix Lancets lancets      amLODipine  (NORVASC ) 5 MG tablet Take 1 tablet (5 mg total) by mouth daily. 90 tablet 1   Blood Glucose Monitoring Suppl (ACCU-CHEK GUIDE) w/Device KIT      Blood Glucose Monitoring Suppl DEVI 1 each by Does not apply route in the morning, at noon, and at bedtime. May substitute to any manufacturer covered by patient's insurance. 1 each 0   lisinopril -hydrochlorothiazide  (ZESTORETIC ) 20-25 MG tablet Take 1 tablet by mouth daily. 90 tablet 1   meloxicam  (MOBIC ) 15 MG tablet Take 1 tablet (15 mg total) by mouth daily. 30 tablet 0   methocarbamol  (ROBAXIN ) 500 MG tablet Take 1 tablet (500 mg total) by mouth every 6 (six) hours as needed. 30 tablet 0   NON FORMULARY Pt uses a cpap nightly     oxyCODONE  (ROXICODONE ) 5 MG immediate release tablet Take 1 tablet (5 mg total) by mouth every 4 (four) hours as needed. 30 tablet 0   sildenafil  (VIAGRA ) 100 MG tablet Take 1 tablet (100 mg total) by mouth daily as needed for erectile dysfunction. 10 tablet 3   tirzepatide  (MOUNJARO ) 5 MG/0.5ML Pen INJECT 5 MG SUBCUTANEOUSLY WEEKLY 2 mL 0   No current  facility-administered medications on file prior to visit.   "

## 2024-02-12 ENCOUNTER — Ambulatory Visit: Payer: Self-pay

## 2024-02-12 NOTE — Telephone Encounter (Signed)
 FYI Only or Action Required?: FYI only for provider: UC appt 1/1.  Patient was last seen in primary care on 05/23/2023 by Merna Huxley, NP.  Called Nurse Triage reporting URI and Sore Throat.  Symptoms began several days ago.  Interventions attempted: Rest, hydration, or home remedies.  Symptoms are: gradually worsening.  Triage Disposition: Call PCP Within 24 Hours  Patient/caregiver understands and will follow disposition?: Yes  Copied from CRM #[id number]. Topic: Clinical - Red Word Triage >> Feb 12, 2024  1:17 PM Alexandria E wrote: [facility] that prompted transfer to Nurse Triage: Cold going on for 3 days. Headache, body aches, sore throat, and a productive cough with yellow phlegm. Reason for Disposition  Patient is HIGH RISK (e.g., 65 years and older, pregnant, HIV+, or chronic medical condition)  Answer Assessment - Initial Assessment Questions 3 days of symptoms- started with a scratchy sore throat (hasn't visualized), sinus congestion, productive cough and also dry cough, body aches, hoarse voice, and sinus pressure headaches. This morning woke and had multiple episodes of diarrhea.  Denies fever, CP, SOB, dizziness.  Appt with UC 1/1 to assess. Will work on hydration, mucinex, warm teas, humidified air, and rest. ED precautions understood.    1. SYMPTOMS: What is your main symptom or concern? (e.g., cough, fever, shortness of breath, muscle aches)     Sore throat, productive cough, congestion, body aches, hoarse voice, sinus pressure headache (4-5/10 pain) 2. ONSET: When did the symptoms start?      3 days  3. COUGH: Do you have a cough? If Yes, ask: How bad is the cough?        Mild-moderate but productive and dry throat  4. FEVER: Do you have a fever? If Yes, ask: What is your temperature, how was it measured, and when did it start?     Denies - had chills on Saturday  5. BREATHING DIFFICULTY: Are you having any difficulty breathing? (e.g., normal;  shortness of breath, wheezing, unable to speak)      denies 6. BETTER-SAME-WORSE: Are you getting better, staying the same or getting worse compared to yesterday?  If getting worse, ask, In what way?     worse 7. OTHER SYMPTOMS: Do you have any other symptoms?  (e.g., chills, fatigue, headache, loss of smell or taste, muscle pain, sore throat)     Headache, diarrhea, congestion  8. INFLUENZA EXPOSURE: Was there any known exposure to influenza (flu) before the symptoms began?      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 INFLUENZA SUSPECTED: Why do you think you have influenza? (e.g., positive flu self-test at home, symptoms after exposure).     Symptoms after exposure  10. INFLUENZA VACCINE: Have you had the flu vaccine? If Yes, ask: When did you last get it?       Has not had in a few years.  11. HIGH RISK FOR COMPLICATIONS: Do you have any chronic medical problems? (e.g., asthma, heart or lung disease, obesity, weak immune system)       OSA, HTN  13. O2 SATURATION MONITOR:  Do you use an oxygen saturation monitor (pulse oximeter) at home? If Yes, ask What is your reading (oxygen level) today? What is your usual oxygen saturation reading? (e.g., 95%)       Doesn't have monitor  Protocols used: Influenza (Flu) Suspected-A-AH

## 2024-02-13 ENCOUNTER — Ambulatory Visit (HOSPITAL_COMMUNITY)
Admission: RE | Admit: 2024-02-13 | Discharge: 2024-02-13 | Disposition: A | Discharge status: Home / Self Care | Source: Home / Self Care

## 2024-02-13 ENCOUNTER — Other Ambulatory Visit: Payer: Self-pay

## 2024-02-13 ENCOUNTER — Encounter (HOSPITAL_COMMUNITY): Payer: Self-pay

## 2024-02-13 VITALS — BP 154/82 | HR 64 | Temp 98.3°F | Resp 22

## 2024-02-13 DIAGNOSIS — Z20828 Contact with and (suspected) exposure to other viral communicable diseases: Secondary | ICD-10-CM

## 2024-02-13 DIAGNOSIS — J069 Acute upper respiratory infection, unspecified: Secondary | ICD-10-CM | POA: Diagnosis not present

## 2024-02-13 DIAGNOSIS — H66002 Acute suppurative otitis media without spontaneous rupture of ear drum, left ear: Secondary | ICD-10-CM | POA: Diagnosis not present

## 2024-02-13 LAB — POC SOFIA SARS ANTIGEN FIA: SARS Coronavirus 2 Ag: NEGATIVE

## 2024-02-13 LAB — POCT INFLUENZA A/B
Influenza A, POC: NEGATIVE
Influenza B, POC: NEGATIVE

## 2024-02-13 MED ORDER — PROMETHAZINE-DM 6.25-15 MG/5ML PO SYRP: 5.0000 mL | ORAL_SOLUTION | Freq: Every evening | ORAL | 0 refills | Status: AC | PRN

## 2024-02-13 MED ORDER — AMOXICILLIN-POT CLAVULANATE 875-125 MG PO TABS: 1.0000 | ORAL_TABLET | Freq: Two times a day (BID) | ORAL | 0 refills | Status: AC

## 2024-02-13 MED ORDER — GUAIFENESIN 400 MG PO TABS: ORAL_TABLET | ORAL | 0 refills | Status: AC

## 2024-02-13 NOTE — ED Triage Notes (Signed)
 Complains of cough, congestion, runny nose, chills and diarrhea  also complains of sore throat  Patient has taken theraflu, cough medicine

## 2024-02-13 NOTE — ED Provider Notes (Signed)
 "    [facility]    CSN: [id number] Arrival date & time: 02/13/24  1200    HISTORY   Chief Complaint  Patient presents with   Nasal Congestion    FLU exposure - Entered by patient   Appointment    12:30   HPI Justin Norman is a pleasant, 54 y.o. male who presents to urgent care today. Patient reports being exposed to influenza from a coworker last week.  Patient states he has been having a cough productive of green sputum, sore, raw feeling throat, nasal congestion, rhinorrhea, chills and diarrhea for the past 4 days.    Past Medical History:  Diagnosis Date   Anxiety    during a divorce   Arthritis    Diabetes mellitus without complication (HCC)    type 2   Dry skin    ERECTILE DYSFUNCTION 03/03/2007   GERD (gastroesophageal reflux disease)    not taking meds   Headache    headaches   History of kidney stones    HYPERTENSION 07/03/2007   Obesities, morbid (HCC)    Pneumonia    Sleep apnea 02/13/2011   Tear of medial meniscus of right knee 11/20/2013   Patient Active Problem List   Diagnosis Date Noted   Stage 3b chronic kidney disease (HCC) 09/06/2022   Vitamin D  deficiency 07/16/2018   Hyperlipidemia 07/16/2018   Tobacco use disorder 09/06/2015   DM (diabetes mellitus), type 2 (HCC) 01/11/2015   Osteoarthritis of both hips 07/09/2014   Tear of medial meniscus of right knee 11/20/2013   Tear, knee, medial meniscus 11/20/2013   OSA (obstructive sleep apnea) 09/18/2012   Perirectal abscess, 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   Morbid obesity (HCC) 10/05/2011   Testosterone  deficiency 06/27/2011   Essential hypertension 07/03/2007   ERECTILE DYSFUNCTION 03/03/2007   HYPERTENSION NEC 03/03/2007   Past Surgical History:  Procedure Laterality Date   DISTAL BICEPS TENDON REPAIR Right 09/05/2023   Procedure: REPAIR, TENDON, BICEPS, DISTAL;  Surgeon: Dozier Soulier, MD;  Location: [facility];  Service: Orthopedics;  Laterality: Right;   KNEE ARTHROSCOPY  Right 11/20/2013   Procedure: RIGHT KNEE ARTHROSCOPY WITH MEDIAL MENISCECTOMY ;  Surgeon: Fonda SHAUNNA Olmsted, MD;  Location: [facility];  Service: Orthopedics;  Laterality: Right;   MASS EXCISION Right 09/18/2019   Procedure: EXCISION MASS RIGHT WRIST;  Surgeon: Murrell Drivers, MD;  Location: [facility];  Service: Orthopedics;  Laterality: Right;  GENERAL OR AXILLARY BLOCK   vocal cord abscess surgery      Home Medications    Prior to Admission medications  Medication Sig Start Date End Date Taking? Authorizing Provider  Accu-Chek Softclix Lancets lancets  10/19/22   [provider]  amLODipine  (NORVASC ) 5 MG tablet Take 1 tablet (5 mg total) by mouth daily. 05/23/23   Nafziger, Darleene, NP  Blood Glucose Monitoring Suppl (ACCU-CHEK GUIDE) w/Device KIT  10/18/22   [provider]  Blood Glucose Monitoring Suppl DEVI 1 each by Does not apply route in the morning, at noon, and at bedtime. May substitute to any manufacturer covered by patient's insurance. 10/18/22   Nafziger, Darleene, NP  lisinopril -hydrochlorothiazide  (ZESTORETIC ) 20-25 MG tablet Take 1 tablet by mouth daily. 05/23/23   Nafziger, Darleene, NP  meloxicam  (MOBIC ) 15 MG tablet Take 1 tablet (15 mg total) by mouth daily. 09/05/23 09/04/24  Porterfield, Amber, PA-C  methocarbamol  (ROBAXIN ) 500 MG tablet Take 1 tablet (500 mg total) by mouth every 6 (six) hours as needed. 09/05/23  Porterfield, Hospital Doctor, PA-C  NON FORMULARY Pt uses a cpap nightly    [provider]  oxyCODONE  (ROXICODONE ) 5 MG immediate release tablet Take 1 tablet (5 mg total) by mouth every 4 (four) hours as needed. 09/05/23   Porterfield, Amber, PA-C  sildenafil  (VIAGRA ) 100 MG tablet Take 1 tablet (100 mg total) by mouth daily as needed for erectile dysfunction. 10/17/22   Nafziger, Darleene, NP  tirzepatide  (MOUNJARO ) 5 MG/0.5ML Pen INJECT 5 MG SUBCUTANEOUSLY WEEKLY 01/31/24   Nafziger, Darleene, NP    Family History Family History  Problem Relation Age of Onset    Hypertension Mother    Arthritis Mother    Dementia Mother    Hypertension Sister    Arrhythmia Sister        visual merchandiser   Diabetes Sister    Kidney disease Sister    Scoliosis Daughter    Stroke Maternal Grandmother    Diabetes Maternal Grandfather    Social History Social History[1] Allergies   Keflex  [cephalexin ] and Metformin  and related  Review of Systems Review of Systems Pertinent findings revealed after performing a 14 point review of systems has been noted in the history of present illness.  Physical Exam Vital Signs BP (!) 154/82 (BP Location: Left Arm) Comment (BP Location): large cuff  Pulse 64   Temp 98.3 F (36.8 C) (Oral)   Resp (!) 22   SpO2 97%   No data found.  Physical Exam Constitutional:      Appearance: He is ill-appearing.  HENT:     Head: Normocephalic and atraumatic.     Salivary Glands: Right salivary gland is not diffusely enlarged or tender. Left salivary gland is not diffusely enlarged or tender.     Right Ear: Tympanic membrane, ear canal and external ear normal.     Left Ear: Ear canal and external ear normal. A middle ear effusion is present. Tympanic membrane is injected and erythematous.     Nose: Congestion and rhinorrhea present. Rhinorrhea is clear.     Right Sinus: No maxillary sinus tenderness or frontal sinus tenderness.     Left Sinus: No maxillary sinus tenderness.     Mouth/Throat:     Mouth: Mucous membranes are moist.     Pharynx: Pharyngeal swelling, posterior oropharyngeal erythema and uvula swelling present.     Tonsils: No tonsillar exudate. 0 on the right. 0 on the left.  Cardiovascular:     Rate and Rhythm: Normal rate and regular rhythm.     Pulses: Normal pulses.  Pulmonary:     Effort: Pulmonary effort is normal. No accessory muscle usage, prolonged expiration or respiratory distress.     Breath sounds: No stridor. No wheezing, rhonchi or rales.     Comments: Turbulent breath sounds throughout without wheeze,  rale, rhonchi. Abdominal:     General: Abdomen is flat. Bowel sounds are normal.     Palpations: Abdomen is soft.  Musculoskeletal:        General: Normal range of motion.  Lymphadenopathy:     Cervical: Cervical adenopathy present.     Right cervical: Superficial cervical adenopathy and posterior cervical adenopathy present.     Left cervical: Superficial cervical adenopathy and posterior cervical adenopathy present.  Skin:    General: Skin is warm and dry.  Neurological:     General: No focal deficit present.     Mental Status: He is alert and oriented to person, place, and time.     Motor: Motor function is intact.  Coordination: Coordination is intact.     Gait: Gait is intact.     Deep Tendon Reflexes: Reflexes are normal and symmetric.  Psychiatric:        Attention and Perception: Attention and perception normal.        Mood and Affect: Mood and affect normal.        Speech: Speech normal.        Behavior: Behavior normal. Behavior is cooperative.        Thought Content: Thought content normal.     Visual Acuity Right Eye Distance:   Left Eye Distance:   Bilateral Distance:    Right Eye Near:   Left Eye Near:    Bilateral Near:     UC Couse / Diagnostics / Procedures:     Radiology No results found.  Procedures Procedures (including critical care time) EKG  Pending results:  Labs Reviewed  POC SOFIA SARS ANTIGEN FIA  POCT INFLUENZA A/B    Medications Ordered in UC: Medications - No data to display  UC Diagnoses / Final Clinical Impressions(s)   I have reviewed the triage vital signs and the nursing notes.  Pertinent labs & imaging results that were available during my care of the patient were reviewed by me and considered in my medical decision making (see chart for details).    Final diagnoses:  Acute suppurative otitis media of left ear without spontaneous rupture of tympanic membrane, recurrence not specified  Viral URI with cough  Exposure  to influenza   Patient provided with a 7-day course of Augmentin  for middle ear infection.  Patient advised of negative COVID-19 and influenza test.  Due to patient symptoms, no further testing is indicated.  Patient currently takes Mobic  daily.  Guaifenesin recommended to promote expectoration and Promethazine  DM for nighttime cough.  Conservative care recommended.  Return precautions advised.  Please see discharge instructions below for details of plan of care as provided to patient. ED Prescriptions     Medication Sig Dispense Auth. Provider   amoxicillin -clavulanate (AUGMENTIN ) 875-125 MG tablet Take 1 tablet by mouth 2 (two) times daily for 7 days. 14 tablet Joesph Shaver Scales, PA-C   guaifenesin (HUMIBID E) 400 MG TABS tablet Take 1 tablet 3 times daily as needed for chest congestion and cough 30 tablet Joesph Shaver Scales, PA-C   promethazine -dextromethorphan (PROMETHAZINE -DM) 6.25-15 MG/5ML syrup Take 5 mLs by mouth at bedtime as needed for cough. 60 mL Joesph Shaver Scales, PA-C      PDMP not reviewed this encounter.    Discharge Instructions      Your rapid influenza and COVID-19 antigen test today was negative.  No further viral testing is indicated     Conservative care is recommended with rest, drinking plenty of clear fluids, eating only when hungry, taking supportive medications for your symptoms and avoiding being around other people.  Please remain at home until you are fever free for 24 hours without the use of antifever medications such as Tylenol  and ibuprofen.   Please read below to learn more about the medications, dosages and frequencies that I recommend to help alleviate your symptoms and to get you feeling better soon:   Augmentin  (amoxicillin  - clavulanic acid): To resolve the bacterial infection in your left middle ear, please take one (1) dose twice daily for 7 days.This antibiotic can cause upset stomach, this will resolve once antibiotics are  complete.  You are welcome to take a probiotic, eat yogurt, take Imodium while taking this medication.  Please avoid other systemic medications such as Maalox, Pepto-Bismol or milk of magnesia as they can interfere with the body's ability to absorb the antibiotics.   Robitussin, Mucinex (guaifenesin): This is a daytime expectorant.  This single symptom reliever helps break up chest congestion and loosen up thick nasal drainage making phlegm and drainage easier to cough up and to blow out from your nose.  I recommend taking 400 mg in either liquid or tablet form three times daily as needed.  I do not recommend the 12-hour extended relief version or doses higher than 400 mg per each dose as these often make some patients feel jittery or jumpy and can interfere with sleep.  I also do not recommend that you purchase guaifenesin with the ingredient  DM which is dextromethorphan, a cough suppressant which I only recommend taking at bedtime.  Guaifenesin 400 mg is a safe dose for people who are being treated for high blood pressure.     Promethazine  DM: Promethazine  is both a nasal decongestant that dries up mucous membranes and an antinausea medication.  Promethazine  often makes most patients feel fairly sleepy.  DM is dextromethorphan, a single symptom reliever which is a cough suppressant found in many over-the-counter cough medications and combination cold preparations.  Please take 5 mL before bedtime to minimize your cough which will help you sleep better.  I have sent a prescription for this medication to your pharmacy because it cannot be purchased over-the-counter.   If symptoms have not meaningfully improved in the next 5 to 7 days, please return for repeat evaluation or follow-up with your regular provider.  If symptoms have worsened in the next 3 to 5 days, please go to the emergency room for further evaluation.    Thank you for visiting urgent care today.  We appreciate the opportunity to  participate in your care.       Disposition Upon Discharge:  Condition: stable for discharge home  Patient presented with an acute illness with associated systemic symptoms and significant discomfort requiring urgent management. In my opinion, this is a condition that a prudent lay person (someone who possesses an average knowledge of health and medicine) may potentially expect to result in complications if not addressed urgently such as respiratory distress, impairment of bodily function or dysfunction of bodily organs.   Routine symptom specific, illness specific and/or disease specific instructions were discussed with the patient and/or caregiver at length.   As such, the patient has been evaluated and assessed, work-up was performed and treatment was provided in alignment with urgent care protocols and evidence based medicine.  Patient/parent/caregiver has been advised that the patient may require follow up for further testing and treatment if the symptoms continue in spite of treatment, as clinically indicated and appropriate.  Patient/parent/caregiver has been advised to return to the [facility] or PCP if no better; to PCP or the Emergency Department if new signs and symptoms develop, or if the current signs or symptoms continue to change or worsen for further workup, evaluation and treatment as clinically indicated and appropriate  The patient will follow up with their current PCP if and as advised. If the patient does not currently have a PCP we will assist them in obtaining one.   The patient may need specialty follow up if the symptoms continue, in spite of conservative treatment and management, for further workup, evaluation, consultation and treatment as clinically indicated and appropriate.  Patient/parent/caregiver verbalized understanding and agreement of plan as discussed.  All questions  were addressed during visit.  Please see discharge instructions below for further details of  plan.  This office note has been dictated using Teaching laboratory technician.  Unfortunately, this method of dictation can sometimes lead to typographical or grammatical errors.  I apologize for your inconvenience in advance if this occurs.  Please do not hesitate to reach out to me if clarification is needed.       [1]  Social History Tobacco Use   Smoking status: Every Day    Current packs/day: 0.50    Average packs/day: 0.5 packs/day for 15.0 years (7.5 ttl pk-yrs)    Types: Cigarettes   Smokeless tobacco: Never  Vaping Use   Vaping status: Never Used  Substance Use Topics   Alcohol use: Yes    Comment: occasional   Drug use: No     Joesph Shaver Scales, PA-C 02/13/24 1315  "

## 2024-02-13 NOTE — Discharge Instructions (Addendum)
 Your rapid influenza and COVID-19 antigen test today was negative.  No further viral testing is indicated     Conservative care is recommended with rest, drinking plenty of clear fluids, eating only when hungry, taking supportive medications for your symptoms and avoiding being around other people.  Please remain at home until you are fever free for 24 hours without the use of antifever medications such as Tylenol  and ibuprofen.   Please read below to learn more about the medications, dosages and frequencies that I recommend to help alleviate your symptoms and to get you feeling better soon:   Augmentin  (amoxicillin  - clavulanic acid): To resolve the bacterial infection in your left middle ear, please take one (1) dose twice daily for 7 days.This antibiotic can cause upset stomach, this will resolve once antibiotics are complete.  You are welcome to take a probiotic, eat yogurt, take Imodium while taking this medication.  Please avoid other systemic medications such as Maalox, Pepto-Bismol or milk of magnesia as they can interfere with the body's ability to absorb the antibiotics.   Robitussin, Mucinex (guaifenesin): This is a daytime expectorant.  This single symptom reliever helps break up chest congestion and loosen up thick nasal drainage making phlegm and drainage easier to cough up and to blow out from your nose.  I recommend taking 400 mg in either liquid or tablet form three times daily as needed.  I do not recommend the 12-hour extended relief version or doses higher than 400 mg per each dose as these often make some patients feel jittery or jumpy and can interfere with sleep.  I also do not recommend that you purchase guaifenesin with the ingredient  DM which is dextromethorphan, a cough suppressant which I only recommend taking at bedtime.  Guaifenesin 400 mg is a safe dose for people who are being treated for high blood pressure.     Promethazine  DM: Promethazine  is both a nasal decongestant  that dries up mucous membranes and an antinausea medication.  Promethazine  often makes most patients feel fairly sleepy.  DM is dextromethorphan, a single symptom reliever which is a cough suppressant found in many over-the-counter cough medications and combination cold preparations.  Please take 5 mL before bedtime to minimize your cough which will help you sleep better.  I have sent a prescription for this medication to your pharmacy because it cannot be purchased over-the-counter.   If symptoms have not meaningfully improved in the next 5 to 7 days, please return for repeat evaluation or follow-up with your regular provider.  If symptoms have worsened in the next 3 to 5 days, please go to the emergency room for further evaluation.    Thank you for visiting urgent care today.  We appreciate the opportunity to participate in your care.

## 2024-02-21 ENCOUNTER — Encounter: Payer: Self-pay | Admitting: Adult Health

## 2024-02-21 ENCOUNTER — Ambulatory Visit: Admitting: Adult Health

## 2024-02-21 VITALS — BP 160/92 | HR 71 | Temp 97.7°F | Ht 74.0 in | Wt 321.0 lb

## 2024-02-21 DIAGNOSIS — Z23 Encounter for immunization: Secondary | ICD-10-CM

## 2024-02-21 DIAGNOSIS — N529 Male erectile dysfunction, unspecified: Secondary | ICD-10-CM | POA: Diagnosis not present

## 2024-02-21 DIAGNOSIS — Z7985 Long-term (current) use of injectable non-insulin antidiabetic drugs: Secondary | ICD-10-CM | POA: Diagnosis not present

## 2024-02-21 DIAGNOSIS — I10 Essential (primary) hypertension: Secondary | ICD-10-CM

## 2024-02-21 DIAGNOSIS — E1169 Type 2 diabetes mellitus with other specified complication: Secondary | ICD-10-CM

## 2024-02-21 LAB — POCT GLYCOSYLATED HEMOGLOBIN (HGB A1C): Hemoglobin A1C: 5.3 % (ref 4.0–5.6)

## 2024-02-21 MED ORDER — TIRZEPATIDE 7.5 MG/0.5ML ~~LOC~~ SOAJ: 7.5000 mg | SUBCUTANEOUS | 0 refills | Status: AC

## 2024-02-21 MED ORDER — SILDENAFIL CITRATE 100 MG PO TABS: 100.0000 mg | ORAL_TABLET | Freq: Every day | ORAL | 3 refills | Status: AC | PRN

## 2024-02-21 NOTE — Progress Notes (Signed)
 "  Subjective:    Patient ID: Justin Norman, male    DOB: 19-Apr-1970, 54 y.o.   MRN: [id number]  Diabetes   54 year old male who  has a past medical history of Anxiety, Arthritis, Diabetes mellitus without complication (HCC), Dry skin, ERECTILE DYSFUNCTION (03/03/2007), GERD (gastroesophageal reflux disease), Headache, History of kidney stones, HYPERTENSION (07/03/2007), Obesities, morbid (HCC), Pneumonia, Sleep apnea (02/13/2011), and Tear of medial meniscus of right knee (11/20/2013).  He presents to the office today for follow up regarding DM/HTN/Obesity   DM Type 2 - Currently managed with Mounjaro  5 mg weekly. He is tolerating this medication well and has no side effects.  Lab Results  Component Value Date   HGBA1C 5.3 02/21/2024   HGBA1C 5.0 09/04/2023   HGBA1C 5.6 05/23/2023   HTN - Managed with Norvasc  5 mg daily and Zestoretic  20-25 mg daily. He reports that he has not been taking his medication on a daily basis and has instead been using beet juice.  BP Readings from Last 3 Encounters:  02/21/24 (!) 160/92  02/13/24 (!) 154/82  09/05/23 135/88    Obesity - he has been working on diet through exercise and eating healthier as well as using Mounjaro . Since starting Mounjaro  in October 2024 he has lost 46 pounds.   Wt Readings from Last 10 Encounters:  02/21/24 (!) 321 lb (145.6 kg)  09/05/23 (!) 326 lb 6.4 oz (148.1 kg)  09/04/23 (!) 326 lb 6.4 oz (148.1 kg)  05/23/23 (!) 333 lb (151 kg)  12/31/22 (!) 359 lb (162.8 kg)  12/13/22 (!) 358 lb (162.4 kg)  11/16/22 (!) 367 lb (166.5 kg)  10/17/22 (!) 370 lb (167.8 kg)  09/21/22 (!) 371 lb (168.3 kg)  09/10/22 (!) 396 lb 3.2 oz (179.7 kg)    Review of Systems See HPI   Past Medical History:  Diagnosis Date   Anxiety    during a divorce   Arthritis    Diabetes mellitus without complication (HCC)    type 2   Dry skin    ERECTILE DYSFUNCTION 03/03/2007   GERD (gastroesophageal reflux disease)    not taking meds    Headache    headaches   History of kidney stones    HYPERTENSION 07/03/2007   Obesities, morbid (HCC)    Pneumonia    Sleep apnea 02/13/2011   Tear of medial meniscus of right knee 11/20/2013    Social History   Socioeconomic History   Marital status: Married    Spouse name: Not on file   Number of children: 2   Years of education: Not on file   Highest education level: GED or equivalent  Occupational History    Employer: [employer]  Tobacco Use   Smoking status: Every Day    Current packs/day: 0.50    Average packs/day: 0.5 packs/day for 15.0 years (7.5 ttl pk-yrs)    Types: Cigarettes   Smokeless tobacco: Never  Vaping Use   Vaping status: Never Used  Substance and Sexual Activity   Alcohol use: Yes    Comment: occasional   Drug use: No   Sexual activity: Yes  Other Topics Concern   Not on file  Social History Narrative   Not on file   Social Drivers of Health   Tobacco Use: High Risk (02/21/2024)   Patient History    Smoking Tobacco Use: Every Day    Smokeless Tobacco Use: Never    Passive Exposure: Not on file  Financial Resource Strain: Medium  Risk (07/11/2022)   Overall Financial Resource Strain (CARDIA)    Difficulty of Paying Living Expenses: Somewhat hard  Food Insecurity: Food Insecurity Present (07/11/2022)   Hunger Vital Sign    Worried About Running Out of Food in the Last Year: Sometimes true    Ran Out of Food in the Last Year: Sometimes true  Transportation Needs: No Transportation Needs (07/11/2022)   PRAPARE - Administrator, Civil Service (Medical): No    Lack of Transportation (Non-Medical): No  Physical Activity: Unknown (07/11/2022)   Exercise Vital Sign    Days of Exercise per Week: Patient declined    Minutes of Exercise per Session: Not on file  Stress: Stress Concern Present (07/11/2022)   [employer] of Occupational Health - Occupational Stress Questionnaire    Feeling of Stress : Rather much  Social Connections:  Unknown (07/11/2022)   Social Connection and Isolation Panel    Frequency of Communication with Friends and Family: Three times a week    Frequency of Social Gatherings with Friends and Family: Once a week    Attends Religious Services: Patient declined    Database Administrator or Organizations: No    Attends Engineer, Structural: Not on file    Marital Status: Married  Catering Manager Violence: Not on file  Depression (PHQ2-9): Low Risk (12/31/2022)   Depression (PHQ2-9)    PHQ-2 Score: 2  Alcohol Screen: Not on file  Housing: Low Risk (07/11/2022)   Housing    Last Housing Risk Score: 0  Utilities: Not on file  Health Literacy: Not on file    Past Surgical History:  Procedure Laterality Date   DISTAL BICEPS TENDON REPAIR Right 09/05/2023   Procedure: REPAIR, TENDON, BICEPS, DISTAL;  Surgeon: Dozier Soulier, MD;  Location: [facility];  Service: Orthopedics;  Laterality: Right;   KNEE ARTHROSCOPY Right 11/20/2013   Procedure: RIGHT KNEE ARTHROSCOPY WITH MEDIAL MENISCECTOMY ;  Surgeon: Fonda SHAUNNA Olmsted, MD;  Location: [facility];  Service: Orthopedics;  Laterality: Right;   MASS EXCISION Right 09/18/2019   Procedure: EXCISION MASS RIGHT WRIST;  Surgeon: Murrell Drivers, MD;  Location: [facility];  Service: Orthopedics;  Laterality: Right;  GENERAL OR AXILLARY BLOCK   vocal cord abscess surgery      Family History  Problem Relation Age of Onset   Hypertension Mother    Arthritis Mother    Dementia Mother    Hypertension Sister    Arrhythmia Sister        pace maker   Diabetes Sister    Kidney disease Sister    Scoliosis Daughter    Stroke Maternal Grandmother    Diabetes Maternal Grandfather     Allergies[1]  Medications Ordered Prior to Encounter[2]  BP (!) 160/92   Pulse 71   Temp 97.7 F (36.5 C) (Oral)   Ht 6' 2 (1.88 m)   Wt (!) 321 lb (145.6 kg)   SpO2 97%   BMI 41.21 kg/m       Objective:   Physical Exam Vitals and nursing note reviewed.   Constitutional:      Appearance: Normal appearance. He is obese.  Cardiovascular:     Rate and Rhythm: Normal rate and regular rhythm.     Pulses: Normal pulses.     Heart sounds: Normal heart sounds.  Pulmonary:     Effort: Pulmonary effort is normal.     Breath sounds: Normal breath sounds.  Skin:    General: Skin  is warm and dry.  Neurological:     General: No focal deficit present.     Mental Status: He is alert and oriented to person, place, and time.  Psychiatric:        Mood and Affect: Mood normal.        Behavior: Behavior normal.        Thought Content: Thought content normal.        Judgment: Judgment normal.       Assessment & Plan:  1. Type 2 diabetes mellitus with other specified complication, without long-term current use of insulin  (HCC) (Primary)  - POC HgB A1c- 5.3  - will increase his Mounjaro  to 7.5 mg weekly  - Follow up in 30 days  - tirzepatide  (MOUNJARO ) 7.5 MG/0.5ML Pen; Inject 7.5 mg into the skin once a week.  Dispense: 2 mL; Refill: 0  2. Essential hypertension - Elevated in the office. Needs to restart his medication   3. Morbid obesity (HCC) - Continue to eat healthy and exercise  - tirzepatide  (MOUNJARO ) 7.5 MG/0.5ML Pen; Inject 7.5 mg into the skin once a week.  Dispense: 2 mL; Refill: 0  4. Erectile dysfunction, unspecified erectile dysfunction type - needs refill  - sildenafil  (VIAGRA ) 100 MG tablet; Take 1 tablet (100 mg total) by mouth daily as needed for erectile dysfunction.  Dispense: 10 tablet; Refill: 3  5. Need for pneumococcal vaccine  - Pneumococcal conjugate vaccine 20-valent (Prevnar 20)  6. Need for influenza vaccination  - Flu vaccine trivalent PF, 6mos and older(Flulaval,Afluria,Fluarix,Fluzone)  Giannamarie Paulus, NP     [1]  Allergies Allergen Reactions   Keflex  [Cephalexin ]    Metformin  And Related Diarrhea and Nausea Only  [2]  Current Outpatient Medications on File Prior to Visit  Medication Sig Dispense  Refill   guaifenesin  (HUMIBID E) 400 MG TABS tablet Take 1 tablet 3 times daily as needed for chest congestion and cough 30 tablet 0   lisinopril -hydrochlorothiazide  (ZESTORETIC ) 20-25 MG tablet Take 1 tablet by mouth daily. 90 tablet 1   meloxicam  (MOBIC ) 15 MG tablet Take 1 tablet (15 mg total) by mouth daily. 30 tablet 0   methocarbamol  (ROBAXIN ) 500 MG tablet Take 1 tablet (500 mg total) by mouth every 6 (six) hours as needed. 30 tablet 0   Misc Natural Products (BEET ROOT PO) Take 2 tablets by mouth daily at 6 (six) AM.     NON FORMULARY Pt uses a cpap nightly     promethazine -dextromethorphan (PROMETHAZINE -DM) 6.25-15 MG/5ML syrup Take 5 mLs by mouth at bedtime as needed for cough. 60 mL 0   amLODipine  (NORVASC ) 5 MG tablet Take 1 tablet (5 mg total) by mouth daily. (Patient not taking: Reported on 02/21/2024) 90 tablet 1   No current facility-administered medications on file prior to visit.   "
